# Patient Record
Sex: Female | Born: 1935 | Race: White | Hispanic: No | Marital: Married | Smoking: Never smoker
Health system: Southern US, Community
[De-identification: ages and names within clinical notes are randomized; demographics above are authoritative.]

## PROBLEM LIST (undated history)

## (undated) DIAGNOSIS — E119 Type 2 diabetes mellitus without complications: Secondary | ICD-10-CM

## (undated) DIAGNOSIS — G43909 Migraine, unspecified, not intractable, without status migrainosus: Secondary | ICD-10-CM

## (undated) DIAGNOSIS — I2510 Atherosclerotic heart disease of native coronary artery without angina pectoris: Secondary | ICD-10-CM

## (undated) DIAGNOSIS — K625 Hemorrhage of anus and rectum: Secondary | ICD-10-CM

## (undated) DIAGNOSIS — K5790 Diverticulosis of intestine, part unspecified, without perforation or abscess without bleeding: Secondary | ICD-10-CM

## (undated) DIAGNOSIS — I10 Essential (primary) hypertension: Secondary | ICD-10-CM

## (undated) DIAGNOSIS — K922 Gastrointestinal hemorrhage, unspecified: Secondary | ICD-10-CM

## (undated) DIAGNOSIS — I4891 Unspecified atrial fibrillation: Secondary | ICD-10-CM

## (undated) DIAGNOSIS — M1990 Unspecified osteoarthritis, unspecified site: Secondary | ICD-10-CM

## (undated) DIAGNOSIS — Z950 Presence of cardiac pacemaker: Secondary | ICD-10-CM

## (undated) HISTORY — DX: Unspecified atrial fibrillation: I48.91

## (undated) HISTORY — PX: CATARACT EXTRACTION W/ INTRAOCULAR LENS  IMPLANT, BILATERAL: SHX1307

## (undated) HISTORY — PX: TOTAL HIP ARTHROPLASTY: SHX124

## (undated) HISTORY — PX: JOINT REPLACEMENT: SHX530

## (undated) HISTORY — DX: Diverticulosis of intestine, part unspecified, without perforation or abscess without bleeding: K57.90

## (undated) HISTORY — DX: Gastrointestinal hemorrhage, unspecified: K92.2

---

## 2014-09-01 DIAGNOSIS — I70213 Atherosclerosis of native arteries of extremities with intermittent claudication, bilateral legs: Secondary | ICD-10-CM | POA: Diagnosis not present

## 2014-09-01 DIAGNOSIS — B351 Tinea unguium: Secondary | ICD-10-CM | POA: Diagnosis not present

## 2014-09-01 DIAGNOSIS — I83813 Varicose veins of bilateral lower extremities with pain: Secondary | ICD-10-CM | POA: Diagnosis not present

## 2014-09-07 DIAGNOSIS — Z7901 Long term (current) use of anticoagulants: Secondary | ICD-10-CM | POA: Diagnosis not present

## 2014-09-07 DIAGNOSIS — R9431 Abnormal electrocardiogram [ECG] [EKG]: Secondary | ICD-10-CM | POA: Diagnosis not present

## 2014-09-07 DIAGNOSIS — R7989 Other specified abnormal findings of blood chemistry: Secondary | ICD-10-CM | POA: Diagnosis not present

## 2014-09-07 DIAGNOSIS — R502 Drug induced fever: Secondary | ICD-10-CM | POA: Diagnosis not present

## 2014-09-07 DIAGNOSIS — I480 Paroxysmal atrial fibrillation: Secondary | ICD-10-CM | POA: Diagnosis not present

## 2014-09-07 DIAGNOSIS — E119 Type 2 diabetes mellitus without complications: Secondary | ICD-10-CM | POA: Diagnosis not present

## 2014-09-07 DIAGNOSIS — R509 Fever, unspecified: Secondary | ICD-10-CM | POA: Diagnosis not present

## 2014-09-07 DIAGNOSIS — R531 Weakness: Secondary | ICD-10-CM | POA: Diagnosis not present

## 2014-09-07 DIAGNOSIS — R001 Bradycardia, unspecified: Secondary | ICD-10-CM | POA: Diagnosis not present

## 2014-09-07 DIAGNOSIS — N10 Acute tubulo-interstitial nephritis: Secondary | ICD-10-CM | POA: Diagnosis not present

## 2014-09-07 DIAGNOSIS — I10 Essential (primary) hypertension: Secondary | ICD-10-CM | POA: Diagnosis not present

## 2014-09-07 DIAGNOSIS — R110 Nausea: Secondary | ICD-10-CM | POA: Diagnosis not present

## 2014-09-07 DIAGNOSIS — N12 Tubulo-interstitial nephritis, not specified as acute or chronic: Secondary | ICD-10-CM | POA: Diagnosis not present

## 2014-09-07 DIAGNOSIS — E876 Hypokalemia: Secondary | ICD-10-CM | POA: Diagnosis not present

## 2014-09-08 DIAGNOSIS — E876 Hypokalemia: Secondary | ICD-10-CM | POA: Diagnosis present

## 2014-09-08 DIAGNOSIS — Z7901 Long term (current) use of anticoagulants: Secondary | ICD-10-CM | POA: Diagnosis not present

## 2014-09-08 DIAGNOSIS — R079 Chest pain, unspecified: Secondary | ICD-10-CM | POA: Diagnosis not present

## 2014-09-08 DIAGNOSIS — I480 Paroxysmal atrial fibrillation: Secondary | ICD-10-CM | POA: Diagnosis present

## 2014-09-08 DIAGNOSIS — I10 Essential (primary) hypertension: Secondary | ICD-10-CM | POA: Diagnosis present

## 2014-09-08 DIAGNOSIS — R9431 Abnormal electrocardiogram [ECG] [EKG]: Secondary | ICD-10-CM | POA: Diagnosis present

## 2014-09-08 DIAGNOSIS — R509 Fever, unspecified: Secondary | ICD-10-CM | POA: Diagnosis present

## 2014-09-08 DIAGNOSIS — N16 Renal tubulo-interstitial disorders in diseases classified elsewhere: Secondary | ICD-10-CM | POA: Diagnosis not present

## 2014-09-08 DIAGNOSIS — B3749 Other urogenital candidiasis: Secondary | ICD-10-CM | POA: Diagnosis not present

## 2014-09-08 DIAGNOSIS — E119 Type 2 diabetes mellitus without complications: Secondary | ICD-10-CM | POA: Diagnosis present

## 2014-09-08 DIAGNOSIS — R001 Bradycardia, unspecified: Secondary | ICD-10-CM | POA: Diagnosis present

## 2014-09-08 DIAGNOSIS — N111 Chronic obstructive pyelonephritis: Secondary | ICD-10-CM | POA: Diagnosis not present

## 2014-09-08 DIAGNOSIS — N10 Acute tubulo-interstitial nephritis: Secondary | ICD-10-CM | POA: Diagnosis present

## 2014-09-14 DIAGNOSIS — I10 Essential (primary) hypertension: Secondary | ICD-10-CM | POA: Diagnosis not present

## 2014-09-14 DIAGNOSIS — N390 Urinary tract infection, site not specified: Secondary | ICD-10-CM | POA: Diagnosis not present

## 2014-10-25 DIAGNOSIS — I509 Heart failure, unspecified: Secondary | ICD-10-CM | POA: Diagnosis not present

## 2014-10-25 DIAGNOSIS — I440 Atrioventricular block, first degree: Secondary | ICD-10-CM | POA: Diagnosis not present

## 2014-10-25 DIAGNOSIS — E119 Type 2 diabetes mellitus without complications: Secondary | ICD-10-CM | POA: Diagnosis not present

## 2014-10-25 DIAGNOSIS — R9431 Abnormal electrocardiogram [ECG] [EKG]: Secondary | ICD-10-CM | POA: Diagnosis not present

## 2014-10-25 DIAGNOSIS — R5383 Other fatigue: Secondary | ICD-10-CM | POA: Diagnosis not present

## 2014-10-25 DIAGNOSIS — Z7982 Long term (current) use of aspirin: Secondary | ICD-10-CM | POA: Diagnosis not present

## 2014-10-25 DIAGNOSIS — E785 Hyperlipidemia, unspecified: Secondary | ICD-10-CM | POA: Diagnosis not present

## 2014-10-25 DIAGNOSIS — N390 Urinary tract infection, site not specified: Secondary | ICD-10-CM | POA: Diagnosis not present

## 2014-10-25 DIAGNOSIS — I10 Essential (primary) hypertension: Secondary | ICD-10-CM | POA: Diagnosis not present

## 2014-10-25 DIAGNOSIS — I517 Cardiomegaly: Secondary | ICD-10-CM | POA: Diagnosis not present

## 2014-10-25 DIAGNOSIS — Z79899 Other long term (current) drug therapy: Secondary | ICD-10-CM | POA: Diagnosis not present

## 2014-10-25 DIAGNOSIS — R531 Weakness: Secondary | ICD-10-CM | POA: Diagnosis not present

## 2014-10-26 DIAGNOSIS — R000 Tachycardia, unspecified: Secondary | ICD-10-CM | POA: Diagnosis not present

## 2014-10-26 DIAGNOSIS — E785 Hyperlipidemia, unspecified: Secondary | ICD-10-CM | POA: Diagnosis not present

## 2014-10-26 DIAGNOSIS — I10 Essential (primary) hypertension: Secondary | ICD-10-CM | POA: Diagnosis not present

## 2014-10-26 DIAGNOSIS — R001 Bradycardia, unspecified: Secondary | ICD-10-CM | POA: Diagnosis not present

## 2014-10-26 DIAGNOSIS — I495 Sick sinus syndrome: Secondary | ICD-10-CM | POA: Diagnosis not present

## 2014-10-26 DIAGNOSIS — I350 Nonrheumatic aortic (valve) stenosis: Secondary | ICD-10-CM | POA: Diagnosis not present

## 2014-10-26 DIAGNOSIS — E119 Type 2 diabetes mellitus without complications: Secondary | ICD-10-CM | POA: Diagnosis not present

## 2014-11-01 DIAGNOSIS — I495 Sick sinus syndrome: Secondary | ICD-10-CM | POA: Diagnosis not present

## 2014-11-01 DIAGNOSIS — I350 Nonrheumatic aortic (valve) stenosis: Secondary | ICD-10-CM | POA: Diagnosis not present

## 2014-11-01 DIAGNOSIS — R001 Bradycardia, unspecified: Secondary | ICD-10-CM | POA: Diagnosis not present

## 2014-11-01 DIAGNOSIS — E785 Hyperlipidemia, unspecified: Secondary | ICD-10-CM | POA: Diagnosis not present

## 2014-11-01 DIAGNOSIS — E119 Type 2 diabetes mellitus without complications: Secondary | ICD-10-CM | POA: Diagnosis not present

## 2014-11-01 DIAGNOSIS — I10 Essential (primary) hypertension: Secondary | ICD-10-CM | POA: Diagnosis not present

## 2014-11-02 DIAGNOSIS — I10 Essential (primary) hypertension: Secondary | ICD-10-CM | POA: Diagnosis not present

## 2014-11-02 DIAGNOSIS — E871 Hypo-osmolality and hyponatremia: Secondary | ICD-10-CM | POA: Diagnosis not present

## 2014-11-03 DIAGNOSIS — I495 Sick sinus syndrome: Secondary | ICD-10-CM | POA: Diagnosis not present

## 2014-11-03 DIAGNOSIS — I350 Nonrheumatic aortic (valve) stenosis: Secondary | ICD-10-CM | POA: Diagnosis not present

## 2014-11-03 DIAGNOSIS — R9431 Abnormal electrocardiogram [ECG] [EKG]: Secondary | ICD-10-CM | POA: Diagnosis not present

## 2014-11-04 DIAGNOSIS — E871 Hypo-osmolality and hyponatremia: Secondary | ICD-10-CM | POA: Diagnosis not present

## 2014-11-04 DIAGNOSIS — I10 Essential (primary) hypertension: Secondary | ICD-10-CM | POA: Diagnosis not present

## 2014-11-09 DIAGNOSIS — Z01818 Encounter for other preprocedural examination: Secondary | ICD-10-CM | POA: Diagnosis not present

## 2014-11-09 DIAGNOSIS — Z45010 Encounter for checking and testing of cardiac pacemaker pulse generator [battery]: Secondary | ICD-10-CM | POA: Diagnosis not present

## 2014-11-09 DIAGNOSIS — I4891 Unspecified atrial fibrillation: Secondary | ICD-10-CM | POA: Diagnosis not present

## 2014-11-09 DIAGNOSIS — I480 Paroxysmal atrial fibrillation: Secondary | ICD-10-CM | POA: Diagnosis not present

## 2014-11-09 DIAGNOSIS — I10 Essential (primary) hypertension: Secondary | ICD-10-CM | POA: Diagnosis not present

## 2014-11-09 DIAGNOSIS — R0602 Shortness of breath: Secondary | ICD-10-CM | POA: Diagnosis not present

## 2014-11-09 DIAGNOSIS — E119 Type 2 diabetes mellitus without complications: Secondary | ICD-10-CM | POA: Diagnosis not present

## 2014-11-09 DIAGNOSIS — I495 Sick sinus syndrome: Secondary | ICD-10-CM | POA: Diagnosis not present

## 2014-11-09 DIAGNOSIS — R001 Bradycardia, unspecified: Secondary | ICD-10-CM | POA: Diagnosis not present

## 2014-11-16 DIAGNOSIS — I4891 Unspecified atrial fibrillation: Secondary | ICD-10-CM | POA: Diagnosis not present

## 2014-11-16 DIAGNOSIS — Z950 Presence of cardiac pacemaker: Secondary | ICD-10-CM | POA: Diagnosis not present

## 2014-11-16 DIAGNOSIS — R001 Bradycardia, unspecified: Secondary | ICD-10-CM | POA: Diagnosis not present

## 2014-11-17 DIAGNOSIS — I70213 Atherosclerosis of native arteries of extremities with intermittent claudication, bilateral legs: Secondary | ICD-10-CM | POA: Diagnosis not present

## 2014-11-17 DIAGNOSIS — I10 Essential (primary) hypertension: Secondary | ICD-10-CM | POA: Diagnosis not present

## 2014-11-17 DIAGNOSIS — L603 Nail dystrophy: Secondary | ICD-10-CM | POA: Diagnosis not present

## 2014-11-17 DIAGNOSIS — E871 Hypo-osmolality and hyponatremia: Secondary | ICD-10-CM | POA: Diagnosis not present

## 2014-11-17 DIAGNOSIS — N390 Urinary tract infection, site not specified: Secondary | ICD-10-CM | POA: Diagnosis not present

## 2014-11-17 DIAGNOSIS — B351 Tinea unguium: Secondary | ICD-10-CM | POA: Diagnosis not present

## 2014-11-17 DIAGNOSIS — M79675 Pain in left toe(s): Secondary | ICD-10-CM | POA: Diagnosis not present

## 2014-11-17 DIAGNOSIS — M79674 Pain in right toe(s): Secondary | ICD-10-CM | POA: Diagnosis not present

## 2014-11-23 DIAGNOSIS — I10 Essential (primary) hypertension: Secondary | ICD-10-CM | POA: Diagnosis not present

## 2014-11-23 DIAGNOSIS — E119 Type 2 diabetes mellitus without complications: Secondary | ICD-10-CM | POA: Diagnosis not present

## 2014-11-23 DIAGNOSIS — N390 Urinary tract infection, site not specified: Secondary | ICD-10-CM | POA: Diagnosis not present

## 2014-11-23 DIAGNOSIS — E871 Hypo-osmolality and hyponatremia: Secondary | ICD-10-CM | POA: Diagnosis not present

## 2014-11-23 DIAGNOSIS — N16 Renal tubulo-interstitial disorders in diseases classified elsewhere: Secondary | ICD-10-CM | POA: Diagnosis not present

## 2014-11-24 DIAGNOSIS — R001 Bradycardia, unspecified: Secondary | ICD-10-CM | POA: Diagnosis not present

## 2014-11-30 DIAGNOSIS — I480 Paroxysmal atrial fibrillation: Secondary | ICD-10-CM | POA: Diagnosis not present

## 2015-01-01 ENCOUNTER — Encounter (HOSPITAL_COMMUNITY): Payer: Self-pay | Admitting: *Deleted

## 2015-01-01 ENCOUNTER — Inpatient Hospital Stay (HOSPITAL_COMMUNITY)
Admission: EM | Admit: 2015-01-01 | Discharge: 2015-01-07 | DRG: 378 | Disposition: A | Discharge status: Home / Self Care | Payer: Medicare Other | Attending: Oncology | Admitting: Oncology

## 2015-01-01 ENCOUNTER — Other Ambulatory Visit (HOSPITAL_COMMUNITY): Payer: Self-pay

## 2015-01-01 DIAGNOSIS — I4891 Unspecified atrial fibrillation: Secondary | ICD-10-CM | POA: Diagnosis not present

## 2015-01-01 DIAGNOSIS — E785 Hyperlipidemia, unspecified: Secondary | ICD-10-CM | POA: Diagnosis present

## 2015-01-01 DIAGNOSIS — Z950 Presence of cardiac pacemaker: Secondary | ICD-10-CM

## 2015-01-01 DIAGNOSIS — Z7901 Long term (current) use of anticoagulants: Secondary | ICD-10-CM

## 2015-01-01 DIAGNOSIS — K5731 Diverticulosis of large intestine without perforation or abscess with bleeding: Secondary | ICD-10-CM | POA: Diagnosis not present

## 2015-01-01 DIAGNOSIS — K625 Hemorrhage of anus and rectum: Secondary | ICD-10-CM | POA: Diagnosis not present

## 2015-01-01 DIAGNOSIS — K644 Residual hemorrhoidal skin tags: Secondary | ICD-10-CM | POA: Diagnosis present

## 2015-01-01 DIAGNOSIS — Z7982 Long term (current) use of aspirin: Secondary | ICD-10-CM

## 2015-01-01 DIAGNOSIS — I480 Paroxysmal atrial fibrillation: Secondary | ICD-10-CM | POA: Diagnosis present

## 2015-01-01 DIAGNOSIS — K922 Gastrointestinal hemorrhage, unspecified: Secondary | ICD-10-CM | POA: Insufficient documentation

## 2015-01-01 DIAGNOSIS — D649 Anemia, unspecified: Secondary | ICD-10-CM | POA: Diagnosis present

## 2015-01-01 DIAGNOSIS — R0789 Other chest pain: Secondary | ICD-10-CM | POA: Diagnosis not present

## 2015-01-01 DIAGNOSIS — I482 Chronic atrial fibrillation: Secondary | ICD-10-CM | POA: Diagnosis not present

## 2015-01-01 DIAGNOSIS — Z79899 Other long term (current) drug therapy: Secondary | ICD-10-CM | POA: Diagnosis not present

## 2015-01-01 DIAGNOSIS — Q438 Other specified congenital malformations of intestine: Secondary | ICD-10-CM | POA: Diagnosis not present

## 2015-01-01 DIAGNOSIS — I10 Essential (primary) hypertension: Secondary | ICD-10-CM | POA: Diagnosis not present

## 2015-01-01 DIAGNOSIS — E119 Type 2 diabetes mellitus without complications: Secondary | ICD-10-CM | POA: Diagnosis not present

## 2015-01-01 DIAGNOSIS — I2510 Atherosclerotic heart disease of native coronary artery without angina pectoris: Secondary | ICD-10-CM | POA: Diagnosis present

## 2015-01-01 DIAGNOSIS — K5733 Diverticulitis of large intestine without perforation or abscess with bleeding: Secondary | ICD-10-CM | POA: Diagnosis present

## 2015-01-01 DIAGNOSIS — M6281 Muscle weakness (generalized): Secondary | ICD-10-CM | POA: Diagnosis not present

## 2015-01-01 HISTORY — DX: Presence of cardiac pacemaker: Z95.0

## 2015-01-01 HISTORY — DX: Atherosclerotic heart disease of native coronary artery without angina pectoris: I25.10

## 2015-01-01 LAB — CBC
HCT: 29.1 % — ABNORMAL LOW (ref 36.0–46.0)
HCT: 19.5 % — ABNORMAL LOW (ref 36.0–46.0)
HEMOGLOBIN: 9.6 g/dL — AB (ref 12.0–15.0)
HEMOGLOBIN: 6.6 g/dL — AB (ref 12.0–15.0)
MCH: 31.1 pg (ref 26.0–34.0)
MCH: 31.6 pg (ref 26.0–34.0)
MCHC: 33 g/dL (ref 30.0–36.0)
MCHC: 33.8 g/dL (ref 30.0–36.0)
MCV: 94.2 fL (ref 78.0–100.0)
MCV: 93.3 fL (ref 78.0–100.0)
PLATELETS: 318 10*3/uL (ref 150–400)
PLATELETS: 230 10*3/uL (ref 150–400)
RBC: 3.09 MIL/uL — ABNORMAL LOW (ref 3.87–5.11)
RBC: 2.09 MIL/uL — ABNORMAL LOW (ref 3.87–5.11)
RDW: 15.2 % (ref 11.5–15.5)
RDW: 15.1 % (ref 11.5–15.5)
WBC: 10.6 10*3/uL — AB (ref 4.0–10.5)
WBC: 7.5 10*3/uL (ref 4.0–10.5)

## 2015-01-01 LAB — COMPREHENSIVE METABOLIC PANEL WITH GFR
ALT: 15 U/L (ref 14–54)
AST: 31 U/L (ref 15–41)
Albumin: 3.2 g/dL — ABNORMAL LOW (ref 3.5–5.0)
Alkaline Phosphatase: 26 U/L — ABNORMAL LOW (ref 38–126)
Anion gap: 9 (ref 5–15)
BUN: 28 mg/dL — ABNORMAL HIGH (ref 6–20)
CO2: 21 mmol/L — ABNORMAL LOW (ref 22–32)
Calcium: 9 mg/dL (ref 8.9–10.3)
Chloride: 108 mmol/L (ref 101–111)
Creatinine, Ser: 0.91 mg/dL (ref 0.44–1.00)
GFR calc Af Amer: >60 mL/min
GFR calc non Af Amer: 57 mL/min — ABNORMAL LOW
Glucose, Bld: 162 mg/dL — ABNORMAL HIGH (ref 65–99)
Potassium: 3.9 mmol/L (ref 3.5–5.1)
Sodium: 138 mmol/L (ref 135–145)
Total Bilirubin: 0.8 mg/dL (ref 0.3–1.2)
Total Protein: 6.3 g/dL — ABNORMAL LOW (ref 6.5–8.1)

## 2015-01-01 LAB — ABO/RH: ABO/RH(D): A NEG

## 2015-01-01 LAB — POC OCCULT BLOOD, ED: Fecal Occult Bld: POSITIVE — AB

## 2015-01-01 LAB — PROTIME-INR
INR: 1.42 (ref 0.00–1.49)
Prothrombin Time: 17.5 s — ABNORMAL HIGH (ref 11.6–15.2)

## 2015-01-01 LAB — PREPARE RBC (CROSSMATCH)

## 2015-01-01 MED ORDER — SODIUM CHLORIDE 0.9 % IV BOLUS (SEPSIS)
500.0000 mL | Freq: Once | INTRAVENOUS | Status: AC
  Administered 2015-01-01: 500 mL via INTRAVENOUS

## 2015-01-01 MED ORDER — SODIUM CHLORIDE 0.9 % IV SOLN
Freq: Once | INTRAVENOUS | Status: AC
  Administered 2015-01-06: 12:00:00 via INTRAVENOUS

## 2015-01-01 NOTE — Progress Notes (Signed)
3E RN attempted to get report on pt. ED RN to call back.

## 2015-01-01 NOTE — ED Notes (Signed)
The pt has had rectal bleeding since this am no pain no vomiting no previous history  Hx pacemaker placement  This year

## 2015-01-01 NOTE — ED Provider Notes (Signed)
CSN: [id number]     Arrival date & time 01/01/15  1523 History   First MD Initiated Contact with Patient 01/01/15 1635     Chief Complaint  Patient presents with  . Rectal Bleeding     (Consider location/radiation/quality/duration/timing/severity/associated sxs/prior Treatment) Patient is a 81 y.o. female presenting with hematochezia. The history is provided by the patient.  Rectal Bleeding Quality:  Bright red Amount:  Moderate Duration:  12 hours Timing:  Intermittent Progression:  Unchanged Chronicity:  New Context comment:  On eliquis Similar prior episodes: no   Relieved by:  Nothing Worsened by:  Nothing tried Ineffective treatments:  None tried Associated symptoms: no abdominal pain, no dizziness, no fever and no vomiting     Past Medical History  Diagnosis Date  . Coronary artery disease   . Pacemaker    No past surgical history on file. No family history on file. History  Substance Use Topics  . Smoking status: Never Smoker   . Smokeless tobacco: Not on file  . Alcohol Use: No   OB History    No data available     Review of Systems  Constitutional: Negative for fever and fatigue.  HENT: Negative for congestion and drooling.   Eyes: Negative for pain.  Respiratory: Negative for cough and shortness of breath.   Cardiovascular: Negative for chest pain.  Gastrointestinal: Positive for hematochezia. Negative for nausea, vomiting, abdominal pain and diarrhea.  Genitourinary: Negative for dysuria and hematuria.       Bloody stools  Musculoskeletal: Negative for back pain, gait problem and neck pain.  Skin: Negative for color change.  Neurological: Positive for weakness (generalized). Negative for dizziness and headaches.  Hematological: Negative for adenopathy.  Psychiatric/Behavioral: Negative for behavioral problems.  All other systems reviewed and are negative.     Allergies  Review of patient's allergies indicates no known allergies.  Home  Medications   Prior to Admission medications   Medication Sig Start Date End Date Taking? Authorizing Provider  apixaban (ELIQUIS) 2.5 MG TABS tablet Take 2.5 mg by mouth 2 (two) times daily.   Yes Historical Provider, MD  aspirin EC 81 MG tablet Take 81 mg by mouth daily.   Yes Historical Provider, MD  bacitracin ointment Apply 1 application topically 2 (two) times daily.   Yes Historical Provider, MD  cholecalciferol (VITAMIN D) 1000 UNITS tablet Take 1,000 Units by mouth daily.   Yes Historical Provider, MD  Cyanocobalamin (B-12 PO) Take 1 tablet by mouth daily.   Yes Historical Provider, MD  fenofibrate (TRIGLIDE) 50 MG tablet Take 50 mg by mouth daily.   Yes Historical Provider, MD  Flaxseed, Linseed, (FLAX PO) Take 1,200 mg by mouth daily.   Yes Historical Provider, MD  meloxicam (MOBIC) 7.5 MG tablet Take 7.5 mg by mouth 2 (two) times daily.   Yes Historical Provider, MD  metFORMIN (GLUCOPHAGE) 500 MG tablet Take 500 mg by mouth 2 (two) times daily with a meal.   Yes Historical Provider, MD  metoprolol tartrate (LOPRESSOR) 25 MG tablet Take 25 mg by mouth at bedtime.    Yes Historical Provider, MD  Omega-3 Fatty Acids (FISH OIL TRIPLE STRENGTH) 1400 MG CAPS Take 1,400 mg by mouth 2 (two) times daily.   Yes Historical Provider, MD  polyethylene glycol (MIRALAX / GLYCOLAX) packet Take 17 g by mouth at bedtime.   Yes Historical Provider, MD  ramipril (ALTACE) 10 MG capsule Take 10 mg by mouth daily.   Yes Historical Provider, MD  vitamin C (ASCORBIC ACID) 500 MG tablet Take 500 mg by mouth daily.   Yes Historical Provider, MD   BP 99/49 mmHg  Pulse 68  Temp(Src) 97.7 F (36.5 C) (Oral)  Resp 16  Ht 5\' 6"  (1.676 m)  Wt 134 lb (60.782 kg)  BMI 21.64 kg/m2  SpO2 99% Physical Exam  Constitutional: She is oriented to person, place, and time. She appears well-developed and well-nourished.  HENT:  Head: Normocephalic.  Mouth/Throat: Oropharynx is clear and moist. No oropharyngeal  exudate.  Eyes: Conjunctivae and EOM are normal. Pupils are equal, round, and reactive to light.  Neck: Normal range of motion. Neck supple.  Cardiovascular: Normal rate, regular rhythm, normal heart sounds and intact distal pulses.  Exam reveals no gallop and no friction rub.   No murmur heard. Pulmonary/Chest: Effort normal and breath sounds normal. No respiratory distress. She has no wheezes.  Abdominal: Soft. Bowel sounds are normal. There is no tenderness. There is no rebound and no guarding.  Genitourinary:  Mild external hemorrhoid which is noninflamed on external rectal exam. Dark blood crusting noticed around the external rectum. Dark bloody stool. Rectal exam otherwise normal.  Musculoskeletal: Normal range of motion. She exhibits no edema or tenderness.  Neurological: She is alert and oriented to person, place, and time.  Skin: Skin is warm and dry.  Psychiatric: She has a normal mood and affect. Her behavior is normal.  Nursing note and vitals reviewed.   ED Course  Procedures (including critical care time) Labs Review Labs Reviewed  PROTIME-INR - Abnormal; Notable for the following:    Prothrombin Time 17.5 (*)    All other components within normal limits  CBC - Abnormal; Notable for the following:    WBC 10.6 (*)    RBC 3.09 (*)    Hemoglobin 9.6 (*)    HCT 29.1 (*)    All other components within normal limits  COMPREHENSIVE METABOLIC PANEL - Abnormal; Notable for the following:    CO2 21 (*)    Glucose, Bld 162 (*)    BUN 28 (*)    Total Protein 6.3 (*)    Albumin 3.2 (*)    Alkaline Phosphatase 26 (*)    GFR calc non Af Amer 57 (*)    All other components within normal limits  CBC - Abnormal; Notable for the following:    RBC 2.09 (*)    Hemoglobin 6.6 (*)    HCT 19.5 (*)    All other components within normal limits  CBC - Abnormal; Notable for the following:    RBC 2.04 (*)    Hemoglobin 6.5 (*)    HCT 19.3 (*)    All other components within normal  limits  CBC - Abnormal; Notable for the following:    WBC 13.9 (*)    RBC 2.77 (*)    Hemoglobin 8.6 (*)    HCT 24.3 (*)    RDW 16.9 (*)    All other components within normal limits  GLUCOSE, CAPILLARY - Abnormal; Notable for the following:    Glucose-Capillary 143 (*)    All other components within normal limits  GLUCOSE, CAPILLARY - Abnormal; Notable for the following:    Glucose-Capillary 118 (*)    All other components within normal limits  GLUCOSE, CAPILLARY - Abnormal; Notable for the following:    Glucose-Capillary 120 (*)    All other components within normal limits  POC OCCULT BLOOD, ED - Abnormal; Notable for the following:  Fecal Occult Bld POSITIVE (*)    All other components within normal limits  MRSA PCR SCREENING  APTT  CBC  CBC  POC OCCULT BLOOD, ED  TYPE AND SCREEN  ABO/RH  PREPARE RBC (CROSSMATCH)  PREPARE RBC (CROSSMATCH)    Imaging Review No results found.   EKG Interpretation   Date/Time:  Sunday January 01 2015 15:51:01 EDT Ventricular Rate:  78 PR Interval:    QRS Duration: 112 QT Interval:  406 QTC Calculation: 462 R Axis:   -21 Text Interpretation:  Atrial fibrillation with frequent ventricular-paced  complexes Lateral infarct , age undetermined ST \\T \ T wave abnormality,  consider inferior ischemia ST \\T \ T wave abnormality, consider anterior  ischemia Confirmed by Dathan Attia  MD, Ilham Roughton ([id number]) on 01/01/2015 4:49:06 PM     CRITICAL CARE Performed by: Pamella Pert, S Total critical care time: 30 min Critical care time was exclusive of separately billable procedures and treating other patients. Critical care was necessary to treat or prevent imminent or life-threatening deterioration. Critical care was time spent personally by me on the following activities: development of treatment plan with patient and/or surrogate as well as nursing, discussions with consultants, evaluation of patient's response to treatment, examination of patient,  obtaining history from patient or surrogate, ordering and performing treatments and interventions, ordering and review of laboratory studies, ordering and review of radiographic studies, pulse oximetry and re-evaluation of patient's condition.  MDM   Final diagnoses:  Rectal bleeding    5:20 PM 81 y.o. female w hx of CAD s/p pacemaker in April '16 who presents with 4 episodes of bright red bloody stool since around 6 AM this morning. She has some mild generalized weakness but denies any pain. She is on Eliquis. She has had some soft blood pressures here but vital signs otherwise unremarkable. We'll get screening labs and give a small bolus.  Soft BP's w/ sys in the 90's initially improved w/ small 500 cc IVF bolus. BP began softening again later in her stay in the ED (BP as low as 75/42). She did have a small amount of dark red blood from the rectum which I observed at the bedside. Repeat cbc ordered and another 500 cc IVF was given. I discussed the case multiple times w/ the resident team after the initial consult for admission to keep them updated on her status and discuss further mgmt. We decided to place her in stepdown and i would go ahead and order blood prior to repeat cbc results returning.  IM team states they will contact GI. Pt continues to deny any complaints. Has not been tachycardic (does take BB at night, but likely >24 hrs since last taken).   Hgb returned at 6.5. Blood ordered. Pt taken to stepdown.     Pamella Pert, MD 01/02/15 1154

## 2015-01-01 NOTE — ED Notes (Signed)
ED MD aware of BP  

## 2015-01-01 NOTE — ED Notes (Signed)
Attempted report 

## 2015-01-01 NOTE — H&P (Signed)
Date: 01/01/2015               Patient Name:  Jill Short MRN: [id number]  DOB: [birth year] Age / Sex: 81 y.o., female   PCP: No primary care provider on file.         Medical Service: Internal Medicine Teaching Service         Attending Physician: Dr. Oval Linsey, MD    First Contact: Dr. Trudee Kuster Pager: [phone]  Second Contact: Dr. Gordy Levan Pager: [phone]       After Hours (After 5p/  First Contact Pager: [phone]  weekends / holidays): Second Contact Pager: [phone]   Chief Complaint: BRBPR  History of Present Illness: Pt is an 81 y/o F w/ PMHx of afib w/ pacemaker on eliquis, HTN, DM, and HLD who presents with bright red blood per rectum. This morning at 6:30am pt had an episode of bowel incontinence in bed and noticed blood on the toilet paper when she wiped. She states her stools were brown. She then had 2 more BMs throughout the day with BRBPR. Her daughter noticed that the patient was weaker than usual and appeared pale and brought her to the ED. Pt has DOE. She denies HA, light headedness, SOB, chest pain, n/v, and abd pain. She takes meloxicam 7.5mg  BID. She had a colonoscopy in 2014 that revealed diverticulosis, she was told she did not need another colonoscopy again due to her age. In the ED she passed a blood clot and her blood pressure was low down to 85/45. Her husband states SBP normally in the 120's. She was started on 2 units of RBC transfusion while in the ED.   Meds: Current Facility-Administered Medications  Medication Dose Route Frequency Provider Last Rate Last Dose  . 0.9 %  sodium chloride infusion   Intravenous Once Pamella Pert, MD       Current Outpatient Prescriptions  Medication Sig Dispense Refill  . apixaban (ELIQUIS) 2.5 MG TABS tablet Take 2.5 mg by mouth 2 (two) times daily.    Marland Kitchen aspirin EC 81 MG tablet Take 81 mg by mouth daily.    . bacitracin ointment Apply 1 application topically 2 (two) times daily.    . cholecalciferol (VITAMIN D) 1000  UNITS tablet Take 1,000 Units by mouth daily.    . Cyanocobalamin (B-12 PO) Take 1 tablet by mouth daily.    . fenofibrate (TRIGLIDE) 50 MG tablet Take 50 mg by mouth daily.    . Flaxseed, Linseed, (FLAX PO) Take 1,200 mg by mouth daily.    . meloxicam (MOBIC) 7.5 MG tablet Take 7.5 mg by mouth 2 (two) times daily.    . metFORMIN (GLUCOPHAGE) 500 MG tablet Take 500 mg by mouth 2 (two) times daily with a meal.    . metoprolol tartrate (LOPRESSOR) 25 MG tablet Take 25 mg by mouth at bedtime.     . Omega-3 Fatty Acids (FISH OIL TRIPLE STRENGTH) 1400 MG CAPS Take 1,400 mg by mouth 2 (two) times daily.    . polyethylene glycol (MIRALAX / GLYCOLAX) packet Take 17 g by mouth at bedtime.    . ramipril (ALTACE) 10 MG capsule Take 10 mg by mouth daily.    . vitamin C (ASCORBIC ACID) 500 MG tablet Take 500 mg by mouth daily.      Allergies: Allergies as of 01/01/2015  . (No Known Allergies)   Past Medical History  Diagnosis Date  . Coronary artery disease   . Pacemaker    No  past surgical history on file. No family history on file. History   Social History  . Marital Status: Married    Spouse Name: N/A  . Number of Children: N/A  . Years of Education: N/A   Occupational History  . Not on file.   Social History Main Topics  . Smoking status: Never Smoker   . Smokeless tobacco: Not on file  . Alcohol Use: No  . Drug Use: Not on file  . Sexual Activity: Not on file   Other Topics Concern  . Not on file   Social History Narrative  . No narrative on file    Review of Systems: Pertinent items are noted in HPI.  Physical Exam: Blood pressure 81/42, pulse 75, temperature 97.7 F (36.5 C), temperature source Oral, resp. rate 19, height 5\' 6"  (1.676 m), weight 134 lb (60.782 kg), SpO2 100 %. Physical Exam  Constitutional: She appears well-developed and well-nourished. No distress.  HENT:  Dry mucous membranes   Eyes: Conjunctivae are normal. Pupils are equal, round, and  reactive to light.  Cardiovascular: Normal rate and regular rhythm.   No murmur heard. Pulmonary/Chest: Effort normal and breath sounds normal. She has no wheezes.  Abdominal: Soft. Bowel sounds are normal. There is no tenderness.  Musculoskeletal: She exhibits edema (trace pedal edma).  Skin: Skin is warm and dry. There is pallor (conjunctival pallor).     Lab results: Basic Metabolic Panel:  Recent Labs  01/01/15 1609  NA 138  K 3.9  CL 108  CO2 21*  GLUCOSE 162*  BUN 28*  CREATININE 0.91  CALCIUM 9.0   Liver Function Tests:  Recent Labs  01/01/15 1609  AST 31  ALT 15  ALKPHOS 26*  BILITOT 0.8  PROT 6.3*  ALBUMIN 3.2*   CBC:  Recent Labs  01/01/15 1609  WBC 10.6*  HGB 9.6*  HCT 29.1*  MCV 94.2  PLT 318   Coagulation:  Recent Labs  01/01/15 1609  LABPROT 17.5*  INR 1.42    EKG Interpretation  Date/Time:  Sunday January 01 2015 15:51:01 EDT Ventricular Rate:  78 PR Interval:    QRS Duration: 112 QT Interval:  406 QTC Calculation: 462 R Axis:   -21 Text Interpretation:  Atrial fibrillation with frequent ventricular-paced complexes Lateral infarct , age undetermined ST \\T \ T wave abnormality, consider inferior ischemia ST \\T \ T wave abnormality, consider anterior ischemia Confirmed by HARRISON  MD, FORREST ([id number]) on 01/01/2015 4:49:06 PM  Assessment & Plan by Problem: Active Problems:   GI bleed  GI bleed-- Pt had 3 episodes of BRBPR and then passed a blood clot in the ED w/ hypotension. Likely due to an acute GI bleed from diverticulosis. She denies pain on defecation thus mild external hemorrhoid noted on rectal exam done by ED provider unlikely source of GI bleed. She is on meloxicam BID which could cause a GI bleed. hgb 9.6 w/ no priors to compare to. After she passed her blood clot in the ED stat CBC revealed hgb of 6.6. She is symptomatic with weakness and DOE. Started on 2 units of packed red blood cells transfusion in the ED and given 1L NS  bolus.  - admit to step down - CBC q6h  - NS at 100 cc/hr - GI consult in the am - stop meloxicam - She had her colonoscopy with Dr. Tanna Savoy, his number is [phone], can call in the morning for colo report. Her PCP is Dr Mariel Craft in [place], [place].   DM-- on metformin 500mg  qd at home - SSI w/ CBG q4h while NPO  HTN-- on lopressor 25mg  qhs and ramipril 10mg  daily - hold home meds due to soft BPs  Atrial fibrillation w/ pacemaker - hold eliquis  Diet- npo DVT ppx- SCDs CODE: FULL , confirmed w/ pt and daughter  Dispo: Disposition is deferred at this time, awaiting improvement of current medical problems.   The patient does have a current PCP (Dr Mariel Craft) and does not need an [facility] hospital follow-up appointment after discharge.  The patient does not have transportation limitations that hinder transportation to clinic appointments.  Signed: Norman Herrlich, MD 01/01/2015, 11:45 PM

## 2015-01-02 ENCOUNTER — Encounter (HOSPITAL_COMMUNITY): Payer: Self-pay | Admitting: *Deleted

## 2015-01-02 DIAGNOSIS — I480 Paroxysmal atrial fibrillation: Secondary | ICD-10-CM | POA: Diagnosis present

## 2015-01-02 DIAGNOSIS — K5733 Diverticulitis of large intestine without perforation or abscess with bleeding: Secondary | ICD-10-CM | POA: Diagnosis present

## 2015-01-02 DIAGNOSIS — E119 Type 2 diabetes mellitus without complications: Secondary | ICD-10-CM

## 2015-01-02 DIAGNOSIS — Z8619 Personal history of other infectious and parasitic diseases: Secondary | ICD-10-CM

## 2015-01-02 DIAGNOSIS — Z950 Presence of cardiac pacemaker: Secondary | ICD-10-CM | POA: Diagnosis present

## 2015-01-02 DIAGNOSIS — Z7901 Long term (current) use of anticoagulants: Secondary | ICD-10-CM

## 2015-01-02 LAB — GLUCOSE, CAPILLARY
GLUCOSE-CAPILLARY: 143 mg/dL — AB (ref 65–99)
GLUCOSE-CAPILLARY: 115 mg/dL — AB (ref 65–99)
Glucose-Capillary: 118 mg/dL — ABNORMAL HIGH (ref 65–99)
Glucose-Capillary: 120 mg/dL — ABNORMAL HIGH (ref 65–99)
Glucose-Capillary: 137 mg/dL — ABNORMAL HIGH (ref 65–99)
Glucose-Capillary: 120 mg/dL — ABNORMAL HIGH (ref 65–99)

## 2015-01-02 LAB — CBC
HCT: 19.3 % — ABNORMAL LOW (ref 36.0–46.0)
HCT: 24.3 % — ABNORMAL LOW (ref 36.0–46.0)
HCT: 20.8 % — ABNORMAL LOW (ref 36.0–46.0)
HEMOGLOBIN: 6.5 g/dL — AB (ref 12.0–15.0)
HEMOGLOBIN: 7.2 g/dL — AB (ref 12.0–15.0)
Hemoglobin: 8.6 g/dL — ABNORMAL LOW (ref 12.0–15.0)
MCH: 30.1 pg (ref 26.0–34.0)
MCH: 31 pg (ref 26.0–34.0)
MCH: 31.9 pg (ref 26.0–34.0)
MCHC: 33.7 g/dL (ref 30.0–36.0)
MCHC: 34.6 g/dL (ref 30.0–36.0)
MCHC: 35.4 g/dL (ref 30.0–36.0)
MCV: 94.6 fL (ref 78.0–100.0)
MCV: 87.7 fL (ref 78.0–100.0)
MCV: 87 fL (ref 78.0–100.0)
PLATELETS: 175 10*3/uL (ref 150–400)
PLATELETS: 229 10*3/uL (ref 150–400)
Platelets: 186 10*3/uL (ref 150–400)
RBC: 2.04 MIL/uL — ABNORMAL LOW (ref 3.87–5.11)
RBC: 2.77 MIL/uL — ABNORMAL LOW (ref 3.87–5.11)
RBC: 2.39 MIL/uL — AB (ref 3.87–5.11)
RDW: 15 % (ref 11.5–15.5)
RDW: 16.9 % — AB (ref 11.5–15.5)
RDW: 17.8 % — ABNORMAL HIGH (ref 11.5–15.5)
WBC: 9.5 10*3/uL (ref 4.0–10.5)
WBC: 13.9 10*3/uL — ABNORMAL HIGH (ref 4.0–10.5)
WBC: 13 10*3/uL — AB (ref 4.0–10.5)

## 2015-01-02 LAB — APTT: aPTT: 25 seconds (ref 24–37)

## 2015-01-02 LAB — PREPARE RBC (CROSSMATCH)

## 2015-01-02 LAB — MRSA PCR SCREENING: MRSA BY PCR: NEGATIVE

## 2015-01-02 MED ORDER — SODIUM CHLORIDE 0.9 % IV SOLN
Freq: Once | INTRAVENOUS | Status: AC
  Administered 2015-01-02: 02:00:00 via INTRAVENOUS

## 2015-01-02 MED ORDER — SODIUM CHLORIDE 0.9 % IV SOLN
Freq: Once | INTRAVENOUS | Status: AC
  Administered 2015-01-02: 17:00:00 via INTRAVENOUS

## 2015-01-02 MED ORDER — SODIUM CHLORIDE 0.9 % IV BOLUS (SEPSIS)
500.0000 mL | Freq: Once | INTRAVENOUS | Status: AC
  Administered 2015-01-02: 500 mL via INTRAVENOUS

## 2015-01-02 MED ORDER — SODIUM CHLORIDE 0.9 % IV SOLN
INTRAVENOUS | Status: DC
  Administered 2015-01-02: 1000 mL via INTRAVENOUS

## 2015-01-02 MED ORDER — PANTOPRAZOLE SODIUM 40 MG IV SOLR
40.0000 mg | INTRAVENOUS | Status: DC
  Administered 2015-01-02 – 2015-01-03 (×2): 40 mg via INTRAVENOUS
  Filled 2015-01-02 (×2): qty 40

## 2015-01-02 MED ORDER — ACETAMINOPHEN 325 MG PO TABS: 650.0000 mg | ORAL_TABLET | ORAL | Status: DC | PRN

## 2015-01-02 MED ORDER — INSULIN ASPART 100 UNIT/ML ~~LOC~~ SOLN: 0.0000 [IU] | Freq: Three times a day (TID) | SUBCUTANEOUS | Status: DC

## 2015-01-02 MED ORDER — POLYETHYLENE GLYCOL 3350 17 G PO PACK
17.0000 g | PACK | Freq: Three times a day (TID) | ORAL | Status: DC
  Administered 2015-01-02 – 2015-01-04 (×9): 17 g via ORAL
  Filled 2015-01-02 (×11): qty 1

## 2015-01-02 MED ORDER — INSULIN ASPART 100 UNIT/ML ~~LOC~~ SOLN
0.0000 [IU] | SUBCUTANEOUS | Status: DC
  Administered 2015-01-02: 1 [IU] via SUBCUTANEOUS

## 2015-01-02 NOTE — Consult Note (Signed)
[facility] CONSULT Reason for consult: Rectal Bleeding Referring Physician: Internal Medicine Teaching Service. Primary G.I.: in [place].  Jill Short is an 81 y.o. female.  HPI: she is down here in [place] visiting her daughter. She has lived in [place] as well as [place]. She had regular colonoscopies every few years last being 2 years ago at age 89. She was told she had diverticulosis. She is not aware of any polyps. She was told that after age 40 she did not need any further colonoscopies. She was recently diagnosed with atrial fibrillation had a pacemaker placed and was started on Eliquis  2 months ago. She was admitted yesterday after painless hematochezia and has had bright red blood in the toilet during the night none so far this morning. She did get to units of blood and her Eliquis has been held she has also been on some nonsteroidal's for arthritic pain which he takes intermittently. She is not having any abdominal pain whatsoever. She's had no preceding melanoma etc. To the best of her knowledge and her daughter's knowledge she is not had colon polyps and no one in the family has had colonic malignancy.  Past Medical History  Diagnosis Date  . Coronary artery disease   . Pacemaker     History reviewed. No pertinent past surgical history.  History reviewed. No pertinent family history.  Social History:  reports that she has never smoked. She does not have any smokeless tobacco history on file. She reports that she does not drink alcohol. Her drug history is not on file.  Allergies: No Known Allergies  Medications; Prior to Admission medications   Medication Sig Start Date End Date Taking? Authorizing Provider  apixaban (ELIQUIS) 2.5 MG TABS tablet Take 2.5 mg by mouth 2 (two) times daily.   Yes Historical Provider, MD  aspirin EC 81 MG tablet Take 81 mg by mouth daily.   Yes Historical Provider, MD  bacitracin ointment Apply 1 application  topically 2 (two) times daily.   Yes Historical Provider, MD  cholecalciferol (VITAMIN D) 1000 UNITS tablet Take 1,000 Units by mouth daily.   Yes Historical Provider, MD  Cyanocobalamin (B-12 PO) Take 1 tablet by mouth daily.   Yes Historical Provider, MD  fenofibrate (TRIGLIDE) 50 MG tablet Take 50 mg by mouth daily.   Yes Historical Provider, MD  Flaxseed, Linseed, (FLAX PO) Take 1,200 mg by mouth daily.   Yes Historical Provider, MD  meloxicam (MOBIC) 7.5 MG tablet Take 7.5 mg by mouth 2 (two) times daily.   Yes Historical Provider, MD  metFORMIN (GLUCOPHAGE) 500 MG tablet Take 500 mg by mouth 2 (two) times daily with a meal.   Yes Historical Provider, MD  metoprolol tartrate (LOPRESSOR) 25 MG tablet Take 25 mg by mouth at bedtime.    Yes Historical Provider, MD  Omega-3 Fatty Acids (FISH OIL TRIPLE STRENGTH) 1400 MG CAPS Take 1,400 mg by mouth 2 (two) times daily.   Yes Historical Provider, MD  polyethylene glycol (MIRALAX / GLYCOLAX) packet Take 17 g by mouth at bedtime.   Yes Historical Provider, MD  ramipril (ALTACE) 10 MG capsule Take 10 mg by mouth daily.   Yes Historical Provider, MD  vitamin C (ASCORBIC ACID) 500 MG tablet Take 500 mg by mouth daily.   Yes Historical Provider, MD   . sodium chloride   Intravenous Once  . insulin aspart  0-9 Units Subcutaneous 6 times per day   PRN Meds  Results for  orders placed or performed during the hospital encounter of 01/01/15 (from the past 48 hour(s))  Protime-INR (if patient is taking Coumadin)     Status: Abnormal   Collection Time: 01/01/15  4:09 PM  Result Value Ref Range   Prothrombin Time 17.5 (H) 11.6 - 15.2 seconds   INR 1.42 0.00 - 1.49  CBC     Status: Abnormal   Collection Time: 01/01/15  4:09 PM  Result Value Ref Range   WBC 10.6 (H) 4.0 - 10.5 K/uL   RBC 3.09 (Short) 3.87 - 5.11 MIL/uL   Hemoglobin 9.6 (Short) 12.0 - 15.0 g/dL   HCT 29.1 (Short) 36.0 - 46.0 %   MCV 94.2 78.0 - 100.0 fL   MCH 31.1 26.0 - 34.0 pg   MCHC 33.0 30.0 -  36.0 g/dL   RDW 15.2 11.5 - 15.5 %   Platelets 318 150 - 400 K/uL  Comprehensive metabolic panel     Status: Abnormal   Collection Time: 01/01/15  4:09 PM  Result Value Ref Range   Sodium 138 135 - 145 mmol/Short   Potassium 3.9 3.5 - 5.1 mmol/Short   Chloride 108 101 - 111 mmol/Short   CO2 21 (Short) 22 - 32 mmol/Short   Glucose, Bld 162 (H) 65 - 99 mg/dL   BUN 28 (H) 6 - 20 mg/dL   Creatinine, Ser 0.91 0.44 - 1.00 mg/dL   Calcium 9.0 8.9 - 10.3 mg/dL   Total Protein 6.3 (Short) 6.5 - 8.1 g/dL   Albumin 3.2 (Short) 3.5 - 5.0 g/dL   AST 31 15 - 41 U/Short   ALT 15 14 - 54 U/Short   Alkaline Phosphatase 26 (Short) 38 - 126 U/Short   Total Bilirubin 0.8 0.3 - 1.2 mg/dL   GFR calc non Af Amer 57 (Short) >60 mL/min   GFR calc Af Amer >60 >60 mL/min    Comment: (NOTE) The eGFR has been calculated using the CKD EPI equation. This calculation has not been validated in all clinical situations. eGFR's persistently <60 mL/min signify possible Chronic Kidney Disease.    Anion gap 9 5 - 15  Type and screen     Status: None (Preliminary result)   Collection Time: 01/01/15  4:10 PM  Result Value Ref Range   ABO/RH(D) A NEG    Antibody Screen NEG    Sample Expiration 01/04/2015    Unit Number [id number]    Blood Component Type RED CELLS,LR    Unit division 00    Status of Unit ALLOCATED    Transfusion Status OK TO TRANSFUSE    Crossmatch Result Compatible    Unit Number [id number]    Blood Component Type RED CELLS,LR    Unit division 00    Status of Unit ISSUED    Transfusion Status OK TO TRANSFUSE    Crossmatch Result Compatible    Unit Number [id number]    Blood Component Type RBC LR PHER1    Unit division 00    Status of Unit ISSUED    Transfusion Status OK TO TRANSFUSE    Crossmatch Result Compatible   ABO/Rh     Status: None   Collection Time: 01/01/15  4:10 PM  Result Value Ref Range   ABO/RH(D) A NEG   POC occult blood, ED     Status: Abnormal   Collection Time: 01/01/15  5:24 PM  Result Value Ref Range    Fecal Occult Bld POSITIVE (A) NEGATIVE  CBC     Status: Abnormal  Collection Time: 01/01/15 11:16 PM  Result Value Ref Range   WBC 7.5 4.0 - 10.5 K/uL   RBC 2.09 (Short) 3.87 - 5.11 MIL/uL   Hemoglobin 6.6 (LL) 12.0 - 15.0 g/dL    Comment: REPEATED TO VERIFY CRITICAL RESULT CALLED TO, READ BACK BY AND VERIFIED WITH: Short.BISHOP,RN 2359 01/01/15 M.CAMPBELL    HCT 19.5 (Short) 36.0 - 46.0 %   MCV 93.3 78.0 - 100.0 fL   MCH 31.6 26.0 - 34.0 pg   MCHC 33.8 30.0 - 36.0 g/dL   RDW 15.1 11.5 - 15.5 %   Platelets 230 150 - 400 K/uL  Prepare RBC     Status: None   Collection Time: 01/01/15 11:27 PM  Result Value Ref Range   Order Confirmation ORDER PROCESSED BY BLOOD BANK   MRSA PCR Screening     Status: None   Collection Time: 01/02/15 12:33 AM  Result Value Ref Range   MRSA by PCR NEGATIVE NEGATIVE    Comment:        The GeneXpert MRSA Assay (FDA approved for NASAL specimens only), is one component of a comprehensive MRSA colonization surveillance program. It is not intended to diagnose MRSA infection nor to guide or monitor treatment for MRSA infections.   CBC     Status: Abnormal   Collection Time: 01/02/15 12:56 AM  Result Value Ref Range   WBC 9.5 4.0 - 10.5 K/uL   RBC 2.04 (Short) 3.87 - 5.11 MIL/uL   Hemoglobin 6.5 (LL) 12.0 - 15.0 g/dL    Comment: REPEATED TO VERIFY CRITICAL VALUE NOTED.  VALUE IS CONSISTENT WITH PREVIOUSLY REPORTED AND CALLED VALUE.    HCT 19.3 (Short) 36.0 - 46.0 %   MCV 94.6 78.0 - 100.0 fL   MCH 31.9 26.0 - 34.0 pg   MCHC 33.7 30.0 - 36.0 g/dL   RDW 15.0 11.5 - 15.5 %   Platelets 229 150 - 400 K/uL  Glucose, capillary     Status: Abnormal   Collection Time: 01/02/15  1:50 AM  Result Value Ref Range   Glucose-Capillary 143 (H) 65 - 99 mg/dL  Prepare RBC     Status: None   Collection Time: 01/02/15  2:00 AM  Result Value Ref Range   Order Confirmation BB SAMPLE OR UNITS ALREADY AVAILABLE   Glucose, capillary     Status: Abnormal   Collection Time:  01/02/15  4:49 AM  Result Value Ref Range   Glucose-Capillary 118 (H) 65 - 99 mg/dL  APTT     Status: None   Collection Time: 01/02/15  7:21 AM  Result Value Ref Range   aPTT 25 24 - 37 seconds  CBC     Status: Abnormal   Collection Time: 01/02/15  7:21 AM  Result Value Ref Range   WBC 13.9 (H) 4.0 - 10.5 K/uL   RBC 2.77 (Short) 3.87 - 5.11 MIL/uL   Hemoglobin 8.6 (Short) 12.0 - 15.0 g/dL    Comment: REPEATED TO VERIFY POST TRANSFUSION SPECIMEN    HCT 24.3 (Short) 36.0 - 46.0 %   MCV 87.7 78.0 - 100.0 fL    Comment: REPEATED TO VERIFY POST TRANSFUSION SPECIMEN    MCH 31.0 26.0 - 34.0 pg   MCHC 35.4 30.0 - 36.0 g/dL   RDW 16.9 (H) 11.5 - 15.5 %   Platelets 175 150 - 400 K/uL  Glucose, capillary     Status: Abnormal   Collection Time: 01/02/15  8:19 AM  Result Value Ref Range  Glucose-Capillary 120 (H) 65 - 99 mg/dL    No results found.            Blood pressure 115/56, pulse 91, temperature 97.4 F (36.3 C), temperature source Oral, resp. rate 18, height _0  (1.702 m), weight 61.5 kg (135 lb 9.3 oz), SpO2 100 %.  Physical exam:   General-- alert reasonably oriented white female in no distress ENT-- nonenteric Neck-- the lymphadenopathy Heart-- regular rate and rhythm without murmurs or gallops Lungs-- clear Abdomen-- soft completely nontender Psych-- alert and oriented and completely appropriate answers questions appropriately   Assessment: 1. Acute hematochezia. This is in the face of a woman who is recently been anticoagulated due to atrial fibrillation. Previous colonoscopies have shown diverticulosis. I suspect that she has a diverticular bleeding exacerbated by anticoagulation. 2. Atrial fibrillation. New onset and has had recent pacemaker and anticoagulation  Plan: 1. Will go ahead and start the patient on clear liquids and Miralax. If her bleeding stops she may not need any further work. If not, we may need to consider another colonoscopy.   Jill Short  JR,Jill Short 01/02/2015, 9:41 AM   Pager: [phone] If no answer or after hours call [phone]

## 2015-01-02 NOTE — Progress Notes (Signed)
Per report given to me by Night shift RN, patient had 5-6 loose bright red red bloody stools throughout the night, the last stool occurrence being at 0600 am.   MD made aware on rounds this morning.  Patient has no complaints this morning. Denies abd pain or cramping.   Will document any bloody stools that I see.    Roxan Hockey, RN

## 2015-01-02 NOTE — Progress Notes (Signed)
Gi orders for Miralax in place.   Patient had a total of 2 Bright red bloody stool for this 7a-7p shift. Last stool at approx 5pm. Stool amounts total to approx 1000 mL combined.   Orders to transfuse 2 units of Packed Red blood cells.   Last loose stool occurred before the 1st blood transfusion was started.   Repeat CBC to follow at 2400 (6/14)  Patient still denies abd pain or cramping.

## 2015-01-02 NOTE — Progress Notes (Signed)
Patient medical records obtained from the office of Dr. Tressie Ellis in [place] in regards to colonoscopy done in [place]. Records placed in  Patient chart.  Roxan Hockey, RN

## 2015-01-02 NOTE — Progress Notes (Signed)
Subjective:    Currently, the patient reports feeling much better than she did prior to admission. Her daughter also reports that she looks better. However, she has continued to have 4-5 bloody bowel movements overnight. She denies any epigastric pain, nausea, or vomiting. She also denies any melena or foul-smelling stool, but she does not look at her bowel movements and reports difficulty with her sense of smell.  Interval Events: -Received 2 units of packed red blood cells with hemoglobin 8.6 posttransfusion.  -Bloody bowel movement of approximately 600 mL at 1100.  -Repeat CBC this afternoon at 1300 was 7.2.  -Seen by GI who recommends observation for now.    Objective:    Vital Signs:   Temp:  [97.4 F (36.3 C)-98.7 F (37.1 C)] 98.7 F (37.1 C) (06/13 1257) Pulse Rate:  [62-94] 79 (06/13 1257) Resp:  [10-31] 14 (06/13 1257) BP: (65-131)/(38-74) 105/53 mmHg (06/13 1257) SpO2:  [97 %-100 %] 100 % (06/13 1257) Weight:  [135 lb 9.3 oz (61.5 kg)] 135 lb 9.3 oz (61.5 kg) (06/13 0030) Last BM Date: 01/02/15  24-hour weight change: Weight change:   Intake/Output:   Intake/Output Summary (Last 24 hours) at 01/02/15 1533 Last data filed at 01/02/15 1200  Gross per 24 hour  Intake 2522.5 ml  Output    604 ml  Net 1918.5 ml      Physical Exam: General: Elderly, pale skin with improved conjunctival pallor.   Lungs:  Normal respiratory effort. Clear to auscultation BL without crackles or wheezes.  Heart: RRR. S1 and S2 normal without gallop, murmur, or rubs.  Abdomen:  BS normoactive. Soft, Nondistended, non-tender.  No masses or organomegaly.  Extremities: No pretibial edema.     Labs:  Basic Metabolic Panel:  Recent Labs Lab 01/01/15 1609  NA 138  K 3.9  CL 108  CO2 21*  GLUCOSE 162*  BUN 28*  CREATININE 0.91  CALCIUM 9.0    Liver Function Tests:  Recent Labs Lab 01/01/15 1609  AST 31  ALT 15  ALKPHOS 26*  BILITOT 0.8  PROT 6.3*  ALBUMIN 3.2*    CBC:  Recent Labs Lab 01/01/15 1609 01/01/15 2316 01/02/15 0056 01/02/15 0721 01/02/15 1314  WBC 10.6* 7.5 9.5 13.9* 13.0*  HGB 9.6* 6.6* 6.5* 8.6* 7.2*  HCT 29.1* 19.5* 19.3* 24.3* 20.8*  MCV 94.2 93.3 94.6 87.7 87.0  PLT 318 230 229 175 186   CBG:  Recent Labs Lab 01/02/15 0150 01/02/15 0449 01/02/15 0819 01/02/15 1256  GLUCAP 143* 118* 120* 44*    Microbiology: Results for orders placed or performed during the hospital encounter of 01/01/15  MRSA PCR Screening     Status: None   Collection Time: 01/02/15 12:33 AM  Result Value Ref Range Status   MRSA by PCR NEGATIVE NEGATIVE Final    Comment:        The GeneXpert MRSA Assay (FDA approved for NASAL specimens only), is one component of a comprehensive MRSA colonization surveillance program. It is not intended to diagnose MRSA infection nor to guide or monitor treatment for MRSA infections.    Coagulation Studies:  Recent Labs  01/01/15 1609  LABPROT 17.5*  INR 1.42   Imaging: No results found.     Medications:    Infusions:    Scheduled Medications: . sodium chloride   Intravenous Once  . sodium chloride   Intravenous Once  . insulin aspart  0-9 Units Subcutaneous 6 times per day  . pantoprazole (PROTONIX) IV  40 mg Intravenous Q24H  . polyethylene glycol  17 g Oral TID    PRN Medications:    Assessment/ Plan:    Active Problems:   GI bleed   Pacemaker   DM (diabetes mellitus)   Atrial fibrillation   Anticoagulated by anticoagulation treatment   Diverticulosis hx of  #Lower GI bleed Given her history of diverticulosis and absence of epigastric pain or vomiting, this is most likely a diverticular bleed. She was seen by GI who recommends observation at this point along with starting a clear liquid diet and MiraLAX. She may need a colonoscopy if her bleeding does not stop. It appears that her bleeding is slowed down somewhat, but she had an additional bloody bowel movement late  this morning. Given her ongoing bleeding and hemoglobin of 7.2 this afternoon, we will transfuse her again. Given her use of meloxicam, we will start her on a PPI though low suspicion for upper GI bleed currently. -2 units of packed red blood cells. -Check posttransfusion CBC and CBCs every 6 hours. -Continue to hold Eliquis and meloxicam. -Protonix 40 mg IV daily. -Clear liquid diet per GI. -MiraLAX per GI.  #Type 2 diabetes Glucose well controlled currently. -Continue to hold home metformin 500 mg daily. -Sliding scale insulin sensitive 3 times a day with meals.  #Hypertension -Hold home Lopressor 25 mg daily at bedtime and ramipril 10 mg daily in setting of GI bleed.  #Atrial fibrillation with pacemaker Rates currently well controlled. -Hold home Lopressor and Eliquis in the setting of GI bleed.   DVT PPX - SCD's while in bed  CODE STATUS - Full  CONSULTS PLACED - GI  DISPO - Disposition is deferred at this time, awaiting improvement of GI bleed.   Anticipated discharge in approximately 2-4 day(s).   The patient does have a current PCP (Jolene Cook in [place]) and does not need an [facility] hospital follow-up appointment after discharge.    Is the [facility] hospital follow-up appointment a one-time only appointment? not applicable.  Does the patient have transportation limitations that hinder transportation to clinic appointments? yes   SERVICE NEEDED AT [place]         Y = Yes, Blank = No PT:   OT:   RN:   Equipment:   Other:      Length of Stay: 1 day(s)   Signed: Charlesetta Shanks, MD  PGY-1, Internal Medicine Resident Pager: [phone] (7AM-5PM) 01/02/2015, 3:33 PM

## 2015-01-02 NOTE — H&P (Signed)
Internal Medicine Attending Admission Note Date: 01/02/2015  Patient name: Jill Short Medical record number: [id number] Date of birth: 06-04-1931 Age: 81 y.o. Gender: female  I saw and evaluated the patient. I reviewed the resident's note and I agree with the resident's findings and plan as documented in the resident's note.  Chief Complaint(s): Bright red blood per rectum 1 day.  History - key components related to admission:  Ms. Decuir is an 81 year old woman with a history of diverticulosis, atrial fibrillation on eliquis, hypertension, hyperlipidemia, and diabetes who presents with a one-day history of bright red blood per rectum. 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 She was visiting her family in [place] when she developed bright red blood per rectum that she initially noticed when she wiped. She had several more bowel movements and it was noted by others that there was bright red blood. She does not look at her stools and has no sense of smell. She therefore cannot say if her previous stools were melanic or if they smelled horribly. She has no previous history of peptic ulcer disease or lower GI bleeds. Her last colonoscopy was within the last 2 years and was reportedly only notable for diverticulosis. She has a history of arthritis and does take as needed meloxicam. She only drinks occasionally. She denies any nausea, vomiting, or hematemesis. She is without other acute complaints at this time.  Physical Exam - key components related to admission:  Filed Vitals:   01/02/15 0700 01/02/15 0730 01/02/15 0830 01/02/15 1257  BP: 118/61 101/55 115/56 105/53  Pulse: 81 76 91 79  Temp:  97.4 F (36.3 C)  98.7 F (37.1 C)  TempSrc:  Oral  Oral  Resp: 14 18 18 14   Height:      Weight:      SpO2: 100% 99% 100% 100%   Gen.: Well-developed, well-nourished, elderly woman lying comfortably in bed in no acute distress. Lungs: Clear to auscultation  bilaterally without wheezes, rhonchi, or rales. Abdomen: Soft, nontender, active bowel sounds without guarding or rebound. Extremities: Without edema  Lab results:  Basic Metabolic Panel:  Recent Labs  01/01/15 1609  NA 138  K 3.9  CL 108  CO2 21*  GLUCOSE 162*  BUN 28*  CREATININE 0.91  CALCIUM 9.0   Liver Function Tests:  Recent Labs  01/01/15 1609  AST 31  ALT 15  ALKPHOS 26*  BILITOT 0.8  PROT 6.3*  ALBUMIN 3.2*   CBC:  Recent Labs  01/02/15 0056 01/02/15 0721  WBC 9.5 13.9*  HGB 6.5* 8.6*  HCT 19.3* 24.3*  MCV 94.6 87.7  PLT 229 175   CBG:  Recent Labs  01/02/15 0150 01/02/15 0449 01/02/15 0819 01/02/15 1256  GLUCAP 143* 118* 120* 137*   Coagulation:  Recent Labs  01/01/15 1609  INR 1.42   Misc. Labs:  FOBT positive  Other results:  EKG: Atrial fibrillation at 78 bpm with occasional ventricularly paced complexes and premature ventricular complexes. Normal axis, normal intervals, no LVH by voltage, significant Q waves laterally in the limb leads, good R wave progression, inferolateral T wave inversions in a strain pattern. No comparisons immediately available.  Assessment & Plan by Problem:  Ms. Bratton is an 81 year old woman with a history of diverticulosis, atrial fibrillation on anticoagulation, hypertension, hyperlipidemia, and diabetes mellitus who presents with a one-day history of bright red blood per rectum and significant anemia requiring 2 units of packed red blood cells. She has no  signs or symptoms of an upper GI bleed and looks too hemodynamically stable to suggest she has a brisk upper GI bleed. She therefore likely is having a diverticular bleed. She is currently hemodynamically stable.  1) Presumed diverticular bleed: We'll maintain an active type and screen and follow serial hemoglobins. We will transfuse if her hemoglobin falls below 7.0 again or if she has active bleeding even though she may have a higher hemoglobin  level. GI has been consulted and is aware. 2 wide bore IVs will be maintained at all times and she will remain in the step down unit for at least another 24 hours. Her anticoagulation for atrial fibrillation has been held. If she stops bleeding spontaneously no further intervention is likely necessary. If she continues to bleed she may require endoscopic evaluation and intervention or surgical intervention.  2) Other chronic medical problems: We are holding her antihypertensives while she is actively bleeding in addition to her anticoagulation as noted above. We have also discontinued her non-steroidal anti-inflammatory agent in the acute setting and placing her on a PPI in case she has an upper GI bleed, although this is felt to be of low likelihood clinically.  3) Disposition: Pending cessation of presumed diverticular bleeding.

## 2015-01-02 NOTE — Progress Notes (Signed)
Per report given to me, Night shift nurse transfused 2 units of packed RBC's over night.  Roxan Hockey, RN

## 2015-01-02 NOTE — Progress Notes (Signed)
Roxan Hockey, RN Registered Nurse   Progress Notes 01/02/2015     Expand All Collapse All   Release of patient healthcare information authorization form faxed to the office of Dr. Tressie Ellis in [place].  Fax: [phone]

## 2015-01-03 LAB — CBC
HCT: 25.1 % — ABNORMAL LOW (ref 36.0–46.0)
HCT: 25.8 % — ABNORMAL LOW (ref 36.0–46.0)
HEMATOCRIT: 24.9 % — AB (ref 36.0–46.0)
HEMATOCRIT: 26.2 % — AB (ref 36.0–46.0)
HEMOGLOBIN: 8.7 g/dL — AB (ref 12.0–15.0)
HEMOGLOBIN: 8.9 g/dL — AB (ref 12.0–15.0)
Hemoglobin: 8.9 g/dL — ABNORMAL LOW (ref 12.0–15.0)
Hemoglobin: 9.1 g/dL — ABNORMAL LOW (ref 12.0–15.0)
MCH: 30.2 pg (ref 26.0–34.0)
MCH: 30.7 pg (ref 26.0–34.0)
MCH: 30.2 pg (ref 26.0–34.0)
MCH: 30.5 pg (ref 26.0–34.0)
MCHC: 34.9 g/dL (ref 30.0–36.0)
MCHC: 35.5 g/dL (ref 30.0–36.0)
MCHC: 34.5 g/dL (ref 30.0–36.0)
MCHC: 34.7 g/dL (ref 30.0–36.0)
MCV: 86.5 fL (ref 78.0–100.0)
MCV: 86.6 fL (ref 78.0–100.0)
MCV: 87.5 fL (ref 78.0–100.0)
MCV: 87.9 fL (ref 78.0–100.0)
Platelets: 139 10*3/uL — ABNORMAL LOW (ref 150–400)
Platelets: 141 10*3/uL — ABNORMAL LOW (ref 150–400)
Platelets: 165 10*3/uL (ref 150–400)
Platelets: 180 10*3/uL (ref 150–400)
RBC: 2.88 MIL/uL — AB (ref 3.87–5.11)
RBC: 2.9 MIL/uL — ABNORMAL LOW (ref 3.87–5.11)
RBC: 2.95 MIL/uL — AB (ref 3.87–5.11)
RBC: 2.98 MIL/uL — AB (ref 3.87–5.11)
RDW: 16 % — ABNORMAL HIGH (ref 11.5–15.5)
RDW: 16 % — ABNORMAL HIGH (ref 11.5–15.5)
RDW: 16.3 % — ABNORMAL HIGH (ref 11.5–15.5)
RDW: 16.3 % — ABNORMAL HIGH (ref 11.5–15.5)
WBC: 13.1 10*3/uL — ABNORMAL HIGH (ref 4.0–10.5)
WBC: 11.9 10*3/uL — ABNORMAL HIGH (ref 4.0–10.5)
WBC: 11.9 10*3/uL — ABNORMAL HIGH (ref 4.0–10.5)
WBC: 11.3 10*3/uL — AB (ref 4.0–10.5)

## 2015-01-03 LAB — TYPE AND SCREEN
ABO/RH(D): A NEG
Antibody Screen: NEGATIVE
UNIT DIVISION: 0
UNIT DIVISION: 0
Unit division: 0
Unit division: 0

## 2015-01-03 LAB — GLUCOSE, CAPILLARY
GLUCOSE-CAPILLARY: 89 mg/dL (ref 65–99)
GLUCOSE-CAPILLARY: 85 mg/dL (ref 65–99)
Glucose-Capillary: 107 mg/dL — ABNORMAL HIGH (ref 65–99)
Glucose-Capillary: 81 mg/dL (ref 65–99)
Glucose-Capillary: 87 mg/dL (ref 65–99)
Glucose-Capillary: 90 mg/dL (ref 65–99)

## 2015-01-03 LAB — TROPONIN I
Troponin I: <0.03 ng/mL (ref <0.031)
Troponin I: <0.03 ng/mL (ref <0.031)

## 2015-01-03 NOTE — Progress Notes (Signed)
Subjective:    The patient reports feeling well this morning. She denies any bloody bowel movements this morning, but she had a couple of bloody bowel movements yesterday.  Interval Events: -CBC at 1300 yesterday 7.2. -Patient received 2 units of packed red blood cells yesterday afternoon. -Bright red blood per rectum at 1700 and 2330. -CBC 8.7 at 100 this morning.    Objective:    Vital Signs:   Temp:  [97.8 F (36.6 C)-98.7 F (37.1 C)] 97.8 F (36.6 C) (06/14 0414) Pulse Rate:  [62-94] 71 (06/14 0414) Resp:  [14-24] 17 (06/14 0414) BP: (98-137)/(47-103) 125/63 mmHg (06/14 0414) SpO2:  [84 %-100 %] 99 % (06/14 0414) Weight:  [140 lb 10.5 oz (63.8 kg)] 140 lb 10.5 oz (63.8 kg) (06/14 0414) Last BM Date: 01/02/15  24-hour weight change: Weight change: 6 lb 10.5 oz (3.018 kg)  Intake/Output:   Intake/Output Summary (Last 24 hours) at 01/03/15 0855 Last data filed at 01/02/15 2315  Gross per 24 hour  Intake 1551.67 ml  Output   1100 ml  Net 451.67 ml      Physical Exam: General: Elderly, resting comfortably.  Lungs:  Normal respiratory effort. Clear to auscultation BL without crackles or wheezes.  Heart: RRR. S1 and S2 normal without gallop, murmur, or rubs.  Abdomen:  BS normoactive. Soft, Nondistended, non-tender.  No masses or organomegaly.  Extremities: No pretibial edema.     Labs:  Basic Metabolic Panel:  Recent Labs Lab 01/01/15 1609  NA 138  K 3.9  CL 108  CO2 21*  GLUCOSE 162*  BUN 28*  CREATININE 0.91  CALCIUM 9.0    Liver Function Tests:  Recent Labs Lab 01/01/15 1609  AST 31  ALT 15  ALKPHOS 26*  BILITOT 0.8  PROT 6.3*  ALBUMIN 3.2*   CBC:  Recent Labs Lab 01/02/15 0056 01/02/15 0721 01/02/15 1314 01/03/15 0108 01/03/15 0731  WBC 9.5 13.9* 13.0* 13.1* 11.9*  HGB 6.5* 8.6* 7.2* 8.7* 8.9*  HCT 19.3* 24.3* 20.8* 24.9* 25.1*  MCV 94.6 87.7 87.0 86.5 86.6  PLT 229 175 186 139* 141*   CBG:  Recent Labs Lab  01/02/15 1723 01/02/15 2055 01/03/15 0011 01/03/15 0413 01/03/15 0816  GLUCAP 120* 115* 107* 69 90    Microbiology: Results for orders placed or performed during the hospital encounter of 01/01/15  MRSA PCR Screening     Status: None   Collection Time: 01/02/15 12:33 AM  Result Value Ref Range Status   MRSA by PCR NEGATIVE NEGATIVE Final    Comment:        The GeneXpert MRSA Assay (FDA approved for NASAL specimens only), is one component of a comprehensive MRSA colonization surveillance program. It is not intended to diagnose MRSA infection nor to guide or monitor treatment for MRSA infections.    Coagulation Studies:  Recent Labs  01/01/15 1609  LABPROT 17.5*  INR 1.42   Imaging: No results found.     Medications:    Infusions:    Scheduled Medications: . sodium chloride   Intravenous Once  . insulin aspart  0-9 Units Subcutaneous TID WC  . pantoprazole (PROTONIX) IV  40 mg Intravenous Q24H  . polyethylene glycol  17 g Oral TID    PRN Medications:    Assessment/ Plan:    Active Problems:   GI bleed   Pacemaker   DM (diabetes mellitus)   Atrial fibrillation   Anticoagulated by anticoagulation treatment   Diverticulosis hx of  #Lower  GI bleed Continues to have GI bleeding, but her frequency of bloody bowel movements appears to be decreasing. -Continue to monitor CBC every 6 hours. -Transfuse if hemoglobin less than 7. -Continue to hold Eliquis and meloxicam. -Continue Protonix 40 mg IV daily, she will need a PPI at discharge if ongoing NSAID use. -Clear liquid diet. -Continue MiraLAX 3 times a day.  #Type 2 diabetes Glucose well controlled currently. -Continue to hold home metformin 500 mg daily. -Sliding scale insulin sensitive 3 times a day with meals.  #Hypertension Normotensive. -Hold home Lopressor 25 mg daily at bedtime and ramipril 10 mg daily in setting of GI bleed.  #Atrial fibrillation with pacemaker Rates continue to be  well controlled. -Hold home Lopressor and Eliquis in the setting of GI bleed.   DVT PPX - SCD's while in bed  CODE STATUS - Full  CONSULTS PLACED - GI  DISPO - Disposition is deferred at this time, awaiting improvement of GI bleed.   Anticipated discharge in approximately 2-4 day(s).   The patient does have a current PCP (Jolene Cook in [place]) and does not need an [facility] hospital follow-up appointment after discharge.    Is the [facility] hospital follow-up appointment a one-time only appointment? not applicable.  Does the patient have transportation limitations that hinder transportation to clinic appointments? yes   SERVICE NEEDED AT [place]         Y = Yes, Blank = No PT:   OT:   RN:   Equipment:   Other:      Length of Stay: 2 day(s)   Signed: Charlesetta Shanks, MD  PGY-1, Internal Medicine Resident Pager: [phone] (7AM-5PM) 01/03/2015, 8:55 AM

## 2015-01-03 NOTE — Clinical Documentation Improvement (Signed)
Supporting Information:  Patient presents with a one-day history of bright red blood per rectum and significant anemia requiring 2 units of packed red blood cells per 6/13 progress notes.  Labs:  H/H: 6/13:  6.5/19.3 6/14:  8.7/24.9  Treatment: 2 units PRBC transfused.   Possible Clinical Condition: . Documentation of Anemia should include the type of anemia: --Nutritional --Hemolytic --Aplastic --Acute blood loss anemia --Acute on chronic blood loss anemia --Other (please specify) . Include in documentation if Anemia is due to nutrition or mineral deficits, resulting in a nutritional anemia . Document if the Anemia is due to a neoplasm (primary and/or secondary) . Document whether the ANEMIA is "related to or due to" chemo or radiotherapy treatments . Document any "cause-and-effect" relationship between the intervention and the blood or immune disorder . Document the specific drug if anemia is drug-induced . Link any laboratory findings to a related diagnosis (if appropriate) . Document any associated diagnoses/conditions     Thank Sherian Maroon Documentation Specialist [phone] [email or web address]

## 2015-01-03 NOTE — Progress Notes (Addendum)
Internal Medicine Night Float Interim Progress Note  S: Called by nursing at 1420 that patient had left-sided chest pain.  Patient reports dull left sided chest pressure beneath her telemetry lead that came on at rest.  She says the pain feels like it is deep in her chest.  It has been coming and going intermittently for past hour.  She denies any similar pain in the past.  She reports having a bloody bowel movement a few minutes ago.  O: Filed Vitals:   01/03/15 1146  BP: 116/66  Pulse: 74  Temp: 98.0  Resp: 22  100% on room air.  Physical Exam  Constitutional: She is well-developed, well-nourished, and in no distress. No distress.  Cardiovascular:  No murmur heard. Irregularly, irregular.  Pulmonary/Chest: She exhibits tenderness (Mild tendernes with palpation of telemetry lead.).  Skin: She is not diaphoretic.   EKG: Occasionally ventricularly paced rhythm, no ST elevation.  T wave changes depending on pacing.  A/P: Possibly musculoskeletal after walking with PT today, located beneath telemetry lead.  Somewhat reproducible on exam.  Not typical for GERD, but could be reflux.  At risk for demand ischemia due to GI bleed.  EKG nonspecific due to pacing.  Hgb stable on recheck this afternoon despite reported bloody bowel movement.   Treatment would be difficult if troponin elevated given ongoing GI bleed and inability to put on heparin currently. -Check troponin now and in 6 hours. -Continue Protonix 40 mg daily. -Tylenol 650 mg q6h PRN. -Continue to monitor Hgb every 6 hours overnight. -Would seek cardiology input if troponins elevated given ongoing GI bleed.  Arman Filter, MD, PhD Internal Medicine Intern Pager: [phone] 01/03/2015,3:17 PM

## 2015-01-03 NOTE — Evaluation (Signed)
Occupational Therapy Evaluation Patient Details Name: Jill Short MRN: [id number] DOB: 02-Jun-1931 Today's Date: 01/03/2015    History of Present Illness Jill Short is an 81 year old woman with a history of diverticulosis, atrial fibrillation on eliquis, hypertension, hyperlipidemia, and diabetes who presents with a one-day history of bright red blood per rectum. 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 She was visiting her family in [place] when she developed bright red blood per rectum that she initially noticed when she wiped. She had several more bowel movements and it was noted by others that there was bright red blood. She does not look at her stools and has no sense of smell. She therefore cannot say if her previous stools were melanic or if they smelled horribly. She has no previous history of peptic ulcer disease or lower GI bleeds. Her last colonoscopy was within the last 2 years and was reportedly only notable for diverticulosis. She has a history of arthritis and does take as needed meloxicam. She only drinks occasionally. She denies any nausea, vomiting, or hematemesis. She is without other acute complaints at this time.   Clinical Impression   Pt was independent prior to admission.  Presents with generalized weakness and mild balance impairment interfering with ability to perform self care and ADL transfers.  Pt with bright red BM, RN made aware.  Will follow acutely. Do not anticipate pt will required post acute OT.    Follow Up Recommendations  No OT follow up    Equipment Recommendations  None recommended by OT    Recommendations for Other Services       Precautions / Restrictions Precautions Precautions: Fall Restrictions Weight Bearing Restrictions: No      Mobility Bed Mobility               General bed mobility comments: pt up in chair  Transfers Overall transfer level: Needs assistance   Transfers: Stand Pivot  Transfers   Stand pivot transfers: Supervision       General transfer comment: stand by assist for safety, managing lines and gown    Balance Overall balance assessment: Needs assistance           Standing balance-Jill Short Scale: Poor                 High Level Balance Comments: reaches for objects to stabilize with transfer            ADL Overall ADL's : Needs assistance/impaired Eating/Feeding: Independent;Sitting   Grooming: Wash/dry hands;Supervision/safety;Standing   Upper Body Bathing: Set up;Sitting   Lower Body Bathing: Supervison/ safety;Sit to/from stand   Upper Body Dressing : Set up;Sitting   Lower Body Dressing: Supervision/safety;Sit to/from stand   Toilet Transfer: Supervision/safety;Stand-pivot;BSC   Toileting- Water quality scientist and Hygiene: Maximal assistance;Sit to/from stand               Vision     Perception     Praxis      Pertinent Vitals/Pain Pain Assessment: No/denies pain     Hand Dominance Right   Extremity/Trunk Assessment Upper Extremity Assessment Upper Extremity Assessment: Overall WFL for tasks assessed   Lower Extremity Assessment Lower Extremity Assessment: Defer to PT evaluation   Cervical / Trunk Assessment Cervical / Trunk Assessment: Normal   Communication Communication Communication: No difficulties   Cognition Arousal/Alertness: Awake/alert Behavior During Therapy: WFL for tasks assessed/performed Overall Cognitive Status: Within Functional Limits for tasks assessed  General Comments       Exercises       Shoulder Instructions      Home Living Family/patient expects to be discharged to:: Private residence Living Arrangements: Spouse/significant other Available Help at Discharge: Family;Available 24 hours/day Type of Home: Other(Comment) (motor home) Home Access: Stairs to enter Entrance Stairs-Number of Steps: 3 Entrance Stairs-Rails: Right Home  Layout: One level     Bathroom Shower/Tub: Occupational psychologist: Standard     Home Equipment: None          Prior Functioning/Environment Level of Independence: Independent             OT Diagnosis: Generalized weakness   OT Problem List: Decreased activity tolerance;Impaired balance (sitting and/or standing)   OT Treatment/Interventions: Self-care/ADL training;Patient/family education    OT Goals(Current goals can be found in the care plan section) Acute Rehab OT Goals Patient Stated Goal: to get better OT Goal Formulation: With patient Time For Goal Achievement: 01/10/15 Potential to Achieve Goals: Good ADL Goals Pt Will Perform Grooming: Independently;standing Pt Will Perform Lower Body Bathing: Independently;sit to/from stand Pt Will Perform Lower Body Dressing: Independently;sit to/from stand Pt Will Transfer to Toilet: Independently;ambulating;regular height toilet Pt Will Perform Toileting - Clothing Manipulation and hygiene: Independently;sit to/from stand Pt Will Perform Tub/Shower Transfer: Shower transfer;ambulating  OT Frequency: Min 2X/week   Barriers to D/C:            Co-evaluation              End of Session Nurse Communication:  (bloody BM)  Activity Tolerance: Patient tolerated treatment well Patient left: in chair;with call bell/phone within reach   Time: 1436-1451 OT Time Calculation (min): 15 min Charges:  OT General Charges $OT Visit: 1 Procedure OT Evaluation $Initial OT Evaluation Tier I: 1 Procedure G-Codes:    Malka So 01/03/2015, 3:08 PM  [phone]

## 2015-01-03 NOTE — Progress Notes (Signed)
Pt complaint of chest discomfort.  EKG ordered.  Dr. Trudee Kuster is notified of this complaint.

## 2015-01-03 NOTE — Evaluation (Signed)
Physical Therapy Evaluation Patient Details Name: Jill Short MRN: [id number] DOB: May 03, 1931 Today's Date: 01/03/2015   History of Present Illness  Jill Short is an 81 year old woman with a history of diverticulosis, atrial fibrillation on eliquis, hypertension, hyperlipidemia, and diabetes who presents with a one-day history of bright red blood per rectum. 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 She was visiting her family in [place] when she developed bright red blood per rectum that she initially noticed when she wiped. She had several more bowel movements and it was noted by others that there was bright red blood. She does not look at her stools and has no sense of smell. She therefore cannot say if her previous stools were melanic or if they smelled horribly. She has no previous history of peptic ulcer disease or lower GI bleeds. Her last colonoscopy was within the last 2 years and was reportedly only notable for diverticulosis. She has a history of arthritis and does take as needed meloxicam. She only drinks occasionally. She denies any nausea, vomiting, or hematemesis. She is without other acute complaints at this time.  Clinical Impression  Pt admitted with above diagnosis. Pt currently with functional limitations due to the deficits listed below (see PT Problem List). Pt able to ambulate fairly well. May need a cane and HHPT safety eval.  Did desat on RA.  Will follow acutely.  Pt will benefit from skilled PT to increase their independence and safety with mobility to allow discharge to the venue listed below.      Follow Up Recommendations Supervision/Assistance - 24 hour;Home health PT (safety eval)    Equipment Recommendations  Cane    Recommendations for Other Services       Precautions / Restrictions Precautions Precautions: Fall Restrictions Weight Bearing Restrictions: No      Mobility  Bed Mobility Overal bed mobility:  Independent                Transfers Overall transfer level: Independent                  Ambulation/Gait Ambulation/Gait assistance: Min guard Ambulation Distance (Feet): 130 Feet Assistive device: None Gait Pattern/deviations: Step-through pattern;Decreased stride length;Trunk flexed   Gait velocity interpretation: Below normal speed for age/gender General Gait Details: Pt ambulated well without LOB.  Slightly unsteady at times needing min guard assist.    Stairs            Wheelchair Mobility    Modified Rankin (Stroke Patients Only)       Balance Overall balance assessment: Needs assistance         Standing balance support: No upper extremity supported;During functional activity Standing balance-Leahy Scale: Poor Standing balance comment: Needed single UE support a few times for balance.  May need cane.               High level balance activites: Sudden stops;Turns;Direction changes High Level Balance Comments: Needed min guard assist for stability a few times with ambulation.               Pertinent Vitals/Pain Pain Assessment: No/denies pain  76-113 bpm, O2 sats decr to 86% with ambulation on RA.  99-100% on RA at rest.      Home Living Family/patient expects to be discharged to:: Private residence Living Arrangements: Spouse/significant other Available Help at Discharge: Family;Available 24 hours/day Type of Home: Mobile home Home Access: Stairs to enter Entrance Stairs-Rails: Right Entrance Stairs-Number of  Steps: 3 Home Layout: One level Home Equipment: None      Prior Function Level of Independence: Independent               Hand Dominance        Extremity/Trunk Assessment   Upper Extremity Assessment: Defer to OT evaluation           Lower Extremity Assessment: Generalized weakness      Cervical / Trunk Assessment: Normal  Communication   Communication: No difficulties  Cognition  Arousal/Alertness: Awake/alert Behavior During Therapy: WFL for tasks assessed/performed Overall Cognitive Status: Within Functional Limits for tasks assessed                      General Comments      Exercises        Assessment/Plan    PT Assessment Patient needs continued PT services  PT Diagnosis Generalized weakness   PT Problem List Decreased activity tolerance;Decreased balance;Decreased mobility;Decreased knowledge of use of DME;Decreased safety awareness;Decreased knowledge of precautions  PT Treatment Interventions DME instruction;Gait training;Therapeutic activities;Therapeutic exercise;Balance training;Patient/family education;Stair training;Functional mobility training   PT Goals (Current goals can be found in the Care Plan section) Acute Rehab PT Goals Patient Stated Goal: to get better PT Goal Formulation: With patient Time For Goal Achievement: 01/17/15 Potential to Achieve Goals: Good    Frequency Min 3X/week   Barriers to discharge        Co-evaluation               End of Session Equipment Utilized During Treatment: Gait belt Activity Tolerance: Patient limited by fatigue Patient left: in chair;with call bell/phone within reach Nurse Communication: Mobility status         Time: 0955-1005 PT Time Calculation (min) (ACUTE ONLY): 10 min   Charges:   PT Evaluation $Initial PT Evaluation Tier I: 1 Procedure     PT G CodesDenice Paradise January 09, 2015, 10:59 AM Amanda Cockayne Acute Rehabilitation [phone] (pager)

## 2015-01-03 NOTE — Progress Notes (Signed)
Internal Medicine Attending  Date: 01/03/2015  Patient name: Jill Short Medical record number: [id number] Date of birth: 25-Sep-1930 Age: 81 y.o. Gender: female  I saw and evaluated the patient. I reviewed the resident's note by Dr. Trudee Kuster and I agree with the resident's findings and plans as documented in his progress note.  Ms. Well was walking the halls with PT this AM when seen on rounds.  She had no bloody bowel movements this morning when seen, the last being last night.  She did require 2 units of PRBCs last evening for a low Hgb, but her Hgb this AM was stable.  I suspect that the bleeding has stopped, in part due to the metabolism of the Eliquis over the last 24-48 hours.  We will continue to monitor in the step down unit overnight, and if stable, consider transfer to the general medical ward Vs discharge home off of anticoagulation for at least 2 weeks before reconsidering the risks and benefits of restarting.

## 2015-01-03 NOTE — Progress Notes (Signed)
[facility] PROGRESS NOTE Subjective patient reports that she had a large bowel movement with blood couple hours ago. She's talking on the phone and unable to tell me if this was bright red blood or dark. This was her only bowel movement today so far. She denies abdominal pain. She did receive 2 units of packed cells yesterday. She remains on Miralax.  Objective: Vital signs in last 24 hours: Temp:  [97.8 F (36.6 C)-98.6 F (37 C)] 98 F (36.7 C) (06/14 0800) Pulse Rate:  [68-94] 74 (06/14 1146) Resp:  [15-24] 22 (06/14 1146) BP: (98-137)/(53-103) 116/66 mmHg (06/14 1146) SpO2:  [84 %-100 %] 100 % (06/14 1146) Weight:  [63.8 kg (140 lb 10.5 oz)] 63.8 kg (140 lb 10.5 oz) (06/14 0414) Last BM Date: 01/02/15  Intake/Output from previous day: 06/13 0701 - 06/14 0700 In: 1676.7 [P.O.:360; I.V.:625; Blood:691.7] Out: 1100 [Stool:1100] Intake/Output this shift: Total I/O In: -  Out: 450 [Urine:450]  PE: General-- no acute distress talking on the telephone  Abdomen-- soft and nontender good bowel sounds  Lab Results:  Recent Labs  01/02/15 0721 01/02/15 1314 01/03/15 0108 01/03/15 0731 01/03/15 1415  WBC 13.9* 13.0* 13.1* 11.9* 11.9*  HGB 8.6* 7.2* 8.7* 8.9* 8.9*  HCT 24.3* 20.8* 24.9* 25.1* 25.8*  PLT 175 186 139* 141* 165   BMET  Recent Labs  01/01/15 1609  NA 138  K 3.9  CL 108  CO2 21*  CREATININE 0.91   LFT  Recent Labs  01/01/15 1609  PROT 6.3*  AST 31  ALT 15  ALKPHOS 26*  BILITOT 0.8   PT/INR  Recent Labs  01/01/15 1609  LABPROT 17.5*  INR 1.42   PANCREAS No results for input(s): LIPASE in the last 72 hours.       Studies/Results: No results found.  Medications: I have reviewed the patient's current medications.  Assessment/Plan: 1. G.I. bleed probable lower G.I. bleed suspect due to diverticulosis. Hemoglobin is stable and she may be simply passing out old blood, clots, and stool. Would continue Miralax and see if  her stool does not clear.   Talbert Trembath JR,Zaidyn Claire L 01/03/2015, 3:50 PM  Pager: [phone] If no answer or after hours call [phone]

## 2015-01-04 DIAGNOSIS — Z7901 Long term (current) use of anticoagulants: Secondary | ICD-10-CM

## 2015-01-04 LAB — GLUCOSE, CAPILLARY
GLUCOSE-CAPILLARY: 84 mg/dL (ref 65–99)
GLUCOSE-CAPILLARY: 87 mg/dL (ref 65–99)
GLUCOSE-CAPILLARY: 91 mg/dL (ref 65–99)
GLUCOSE-CAPILLARY: 93 mg/dL (ref 65–99)
Glucose-Capillary: 84 mg/dL (ref 65–99)
Glucose-Capillary: 85 mg/dL (ref 65–99)
Glucose-Capillary: 99 mg/dL (ref 65–99)

## 2015-01-04 LAB — CBC
HCT: 24.1 % — ABNORMAL LOW (ref 36.0–46.0)
HCT: 25.9 % — ABNORMAL LOW (ref 36.0–46.0)
HEMATOCRIT: 24 % — AB (ref 36.0–46.0)
HEMOGLOBIN: 8.4 g/dL — AB (ref 12.0–15.0)
Hemoglobin: 8.3 g/dL — ABNORMAL LOW (ref 12.0–15.0)
Hemoglobin: 8.9 g/dL — ABNORMAL LOW (ref 12.0–15.0)
MCH: 31.1 pg (ref 26.0–34.0)
MCH: 30.5 pg (ref 26.0–34.0)
MCH: 30.8 pg (ref 26.0–34.0)
MCHC: 35 g/dL (ref 30.0–36.0)
MCHC: 34.4 g/dL (ref 30.0–36.0)
MCHC: 34.4 g/dL (ref 30.0–36.0)
MCV: 88.9 fL (ref 78.0–100.0)
MCV: 88.6 fL (ref 78.0–100.0)
MCV: 89.6 fL (ref 78.0–100.0)
Platelets: 164 10*3/uL (ref 150–400)
Platelets: 172 10*3/uL (ref 150–400)
Platelets: 208 10*3/uL (ref 150–400)
RBC: 2.7 MIL/uL — ABNORMAL LOW (ref 3.87–5.11)
RBC: 2.72 MIL/uL — AB (ref 3.87–5.11)
RBC: 2.89 MIL/uL — AB (ref 3.87–5.11)
RDW: 16.3 % — ABNORMAL HIGH (ref 11.5–15.5)
RDW: 16.3 % — ABNORMAL HIGH (ref 11.5–15.5)
RDW: 16.1 % — ABNORMAL HIGH (ref 11.5–15.5)
WBC: 9.5 10*3/uL (ref 4.0–10.5)
WBC: 7.6 10*3/uL (ref 4.0–10.5)
WBC: 8.5 10*3/uL (ref 4.0–10.5)

## 2015-01-04 MED ORDER — PANTOPRAZOLE SODIUM 40 MG PO TBEC
40.0000 mg | DELAYED_RELEASE_TABLET | Freq: Every day | ORAL | Status: DC
  Administered 2015-01-04 – 2015-01-07 (×3): 40 mg via ORAL
  Filled 2015-01-04 (×3): qty 1

## 2015-01-04 NOTE — Progress Notes (Signed)
[facility] PROGRESS NOTE Subjective Pt reports brownish BM with some BRB, no pain  Objective: Vital signs in last 24 hours: Temp:  [97.7 F (36.5 C)-98.5 F (36.9 C)] 98.4 F (36.9 C) (06/15 1500) Pulse Rate:  [70-77] 73 (06/15 1500) Resp:  [16-20] 20 (06/15 1500) BP: (98-142)/(48-117) 107/48 mmHg (06/15 1500) SpO2:  [97 %-100 %] 100 % (06/15 1500) Last BM Date: 01/04/15  Intake/Output from previous day: 06/14 0701 - 06/15 0700 In: 480 [P.O.:480] Out: 2500 [Urine:2500] Intake/Output this shift: Total I/O In: 720 [P.O.:720] Out: -   PE: - Abdomen--soft and nontender  Lab Results:  Recent Labs  01/03/15 0731 01/03/15 1415 01/03/15 1900 01/04/15 0100 01/04/15 0720  WBC 11.9* 11.9* 11.3* 9.5 7.6  HGB 8.9* 8.9* 9.1* 8.4* 8.3*  HCT 25.1* 25.8* 26.2* 24.0* 24.1*  PLT 141* 165 180 164 172   BMET No results for input(s): NA, K, CL, CO2, CREATININE in the last 72 hours. LFT No results for input(s): PROT, AST, ALT, ALKPHOS, BILITOT, BILIDIR, IBILI in the last 72 hours. PT/INR No results for input(s): LABPROT, INR in the last 72 hours. PANCREAS No results for input(s): LIPASE in the last 72 hours.       Studies/Results: No results found.  Medications: I have reviewed the patient's current medications.  Assessment/Plan: 1. Hematochezia. Suspect hemorrhoids since Hg is stable but will continue the miralax and check tomorrow and if still bleeding will go ahead with full prep and colonoscopy.   Lorriane Dehart JR,Keino Placencia L 01/04/2015, 4:57 PM  Pager: [phone] If no answer or after hours call [phone]

## 2015-01-04 NOTE — Progress Notes (Signed)
Subjective:    The patient continues to feel well this morning, but she reports one bloody bowel movement last night. She reports her energy is stable, and she denies any lightheadedness. She reports that her chest discomfort has resolved.  Interval Events: -Episode of chest pain yesterday afternoon, EKG paced and unrevealing. Troponins negative 2. Now resolved. -Bloody bowel movement recorded at 1602 yesterday, patient reported additional bloody bowel movement overnight. -Vital signs stable overnight.    Objective:    Vital Signs:   Temp:  [97.7 F (36.5 C)-98.5 F (36.9 C)] 97.8 F (36.6 C) (06/15 0816) Pulse Rate:  [70-79] 70 (06/15 0816) Resp:  [16-22] 16 (06/15 0816) BP: (109-142)/(49-117) 113/60 mmHg (06/15 0816) SpO2:  [97 %-100 %] 99 % (06/15 0816) Last BM Date: 01/03/15  24-hour weight change: Weight change:   Intake/Output:   Intake/Output Summary (Last 24 hours) at 01/04/15 0856 Last data filed at 01/03/15 2200  Gross per 24 hour  Intake    480 ml  Output   2500 ml  Net  -2020 ml      Physical Exam: General: Elderly, resting comfortably.  Lungs:  Normal respiratory effort. Clear to auscultation BL without crackles or wheezes.  Heart: RRR. S1 and S2 normal without gallop, murmur, or rubs.  Abdomen:  BS normoactive. Soft, Nondistended, non-tender.  No masses or organomegaly.  Extremities: No pretibial edema.     Labs:  Basic Metabolic Panel:  Recent Labs Lab 01/01/15 1609  NA 138  K 3.9  CL 108  CO2 21*  GLUCOSE 162*  BUN 28*  CREATININE 0.91  CALCIUM 9.0    Liver Function Tests:  Recent Labs Lab 01/01/15 1609  AST 31  ALT 15  ALKPHOS 26*  BILITOT 0.8  PROT 6.3*  ALBUMIN 3.2*   CBC:  Recent Labs Lab 01/03/15 0731 01/03/15 1415 01/03/15 1900 01/04/15 0100 01/04/15 0720  WBC 11.9* 11.9* 11.3* 9.5 7.6  HGB 8.9* 8.9* 9.1* 8.4* 8.3*  HCT 25.1* 25.8* 26.2* 24.0* 24.1*  MCV 86.6 87.5 87.9 88.9 88.6  PLT 141* 165 180 164 172    CBG:  Recent Labs Lab 01/03/15 1305 01/03/15 1627 01/03/15 2108 01/04/15 0013 01/04/15 0442  GLUCAP 87 81 85 99 84    Microbiology: Results for orders placed or performed during the hospital encounter of 01/01/15  MRSA PCR Screening     Status: None   Collection Time: 01/02/15 12:33 AM  Result Value Ref Range Status   MRSA by PCR NEGATIVE NEGATIVE Final    Comment:        The GeneXpert MRSA Assay (FDA approved for NASAL specimens only), is one component of a comprehensive MRSA colonization surveillance program. It is not intended to diagnose MRSA infection nor to guide or monitor treatment for MRSA infections.    Coagulation Studies:  Recent Labs  01/01/15 1609  LABPROT 17.5*  INR 1.42   Imaging: No results found.     Medications:    Infusions:    Scheduled Medications: . sodium chloride   Intravenous Once  . insulin aspart  0-9 Units Subcutaneous TID WC  . pantoprazole (PROTONIX) IV  40 mg Intravenous Q24H  . polyethylene glycol  17 g Oral TID    PRN Medications:    Assessment/ Plan:    Active Problems:   GI bleed   Pacemaker   DM (diabetes mellitus)   Atrial fibrillation   Anticoagulated by anticoagulation treatment   Diverticulosis hx of  #Lower GI bleed Continuing to  have bloody bowel movements. Per GI, this may be secondary to previous bleeding that is now being excreted. Her hemoglobin has remained relatively stable overnight and since her last transfusion, suggesting her bleeding has slowed. We will continue conservative management at this time. Physical therapy is recommending home health. -Transfer to med-surg today. -Check CBCs every 12 hours. -Transfuse if hemoglobin less than 7. -Continue to hold Eliquis and meloxicam. -We'll plan to discharge on Tylenol for osteoarthritis. -Switched to oral Protonix 40 mg daily. -Continue clear liquid diet. -Continue MiraLAX 3 times a day. -Ordered home health PT and DME cane.  #Chest  pain Currently resolved, likely musculoskeletal. This was potentially exacerbated by working with physical therapy yesterday. -No further workup unless pain recurs.  #Type 2 diabetes Glucose well controlled currently. -Continue to hold home metformin 500 mg daily. -Sliding scale insulin sensitive 3 times a day with meals.  #Hypertension Normotensive. -Hold home Lopressor 25 mg daily at bedtime and ramipril 10 mg daily in setting of GI bleed.  #Atrial fibrillation with pacemaker Good rate control. -Hold home Lopressor and Eliquis in the setting of GI bleed.   DVT PPX - SCD's while in bed  CODE STATUS - Full  CONSULTS PLACED - GI  DISPO - Disposition is deferred at this time, awaiting improvement of GI bleed.   Anticipated discharge in approximately 2-4 day(s).   The patient does have a current PCP (Jolene Cook in [place]) and does not need an [facility] hospital follow-up appointment after discharge.    Is the [facility] hospital follow-up appointment a one-time only appointment? not applicable.  Does the patient have transportation limitations that hinder transportation to clinic appointments? yes   SERVICE NEEDED AT [facility]         Y = Yes, Blank = No PT: HH  OT:   RN:   Equipment: Cane  Other:      Length of Stay: 3 day(s)   Signed: Charlesetta Shanks, MD  PGY-1, Internal Medicine Resident Pager: [phone] (7AM-5PM) 01/04/2015, 8:56 AM

## 2015-01-04 NOTE — Progress Notes (Signed)
Patient safely transported to [facility] with RN and NT. Family aware of transportation. 6N RN at bedside.

## 2015-01-04 NOTE — Progress Notes (Signed)
Attempted report 

## 2015-01-04 NOTE — Progress Notes (Signed)
Physical Therapy Treatment Patient Details Name: Jill Short MRN: [id number] DOB: [birth year] Today's Date: 01/04/2015    History of Present Illness Jill Short is an 81 year old woman with a history of diverticulosis, atrial fibrillation on eliquis, hypertension, hyperlipidemia, and diabetes who presents with a one-day history of bright red blood per rectum. 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 She was visiting her family in [place] when she developed bright red blood per rectum that she initially noticed when she wiped. She had several more bowel movements and it was noted by others that there was bright red blood. She does not look at her stools and has no sense of smell. She therefore cannot say if her previous stools were melanic or if they smelled horribly. She has no previous history of peptic ulcer disease or lower GI bleeds. Her last colonoscopy was within the last 2 years and was reportedly only notable for diverticulosis. She has a history of arthritis and does take as needed meloxicam. She only drinks occasionally. She denies any nausea, vomiting, or hematemesis. She is without other acute complaints at this time.    PT Comments    Pt with improved ambulation tolerance but con't to demo decreased endurance and generalized weakness. Pt to benefit from HHPT to address deficits and improve balance to minimize risk of falling. P  Follow Up Recommendations  Home health PT;Supervision/Assistance - 24 hour     Equipment Recommendations  Cane    Recommendations for Other Services       Precautions / Restrictions Precautions Precautions: Fall Restrictions Weight Bearing Restrictions: No    Mobility  Bed Mobility               General bed mobility comments: pt up in chair  Transfers Overall transfer level: Needs assistance Equipment used: 1 person hand held assist Transfers: Sit to/from Stand Sit to Stand: Min guard          General transfer comment: min guard to steady during transition  Ambulation/Gait Ambulation/Gait assistance: Min assist Ambulation Distance (Feet): 150 Feet Assistive device: 1 person hand held assist;None Gait Pattern/deviations: Step-through pattern;Decreased stride length;Antalgic   Gait velocity interpretation: Below normal speed for age/gender General Gait Details: pt with mild instability and + SOB. SpO2 varied from 76-97% on RA. unsure of accuracy of pulse ox reading. took many standing rest breaks   Stairs Stairs: Yes Stairs assistance: Min assist Stair Management: One rail Left;Step to pattern Number of Stairs: 5 General stair comments: pt with good technique  Wheelchair Mobility    Modified Rankin (Stroke Patients Only)       Balance                                    Cognition Arousal/Alertness: Awake/alert Behavior During Therapy: WFL for tasks assessed/performed Overall Cognitive Status: Within Functional Limits for tasks assessed                      Exercises      General Comments General comments (skin integrity, edema, etc.): upon initial amb pt was leaking blood from either rectum or vagina. Pt assisted on BSC and urinated. RN notified      Pertinent Vitals/Pain Pain Assessment: No/denies pain    Home Living  Prior Function            PT Goals (current goals can now be found in the care plan section) Progress towards PT goals: PT to reassess next treatment    Frequency  Min 3X/week    PT Plan Current plan remains appropriate    Co-evaluation             End of Session Equipment Utilized During Treatment: Gait belt Activity Tolerance: Patient limited by fatigue Patient left: in chair;with call bell/phone within reach     Time: 1353-1424 PT Time Calculation (min) (ACUTE ONLY): 31 min  Charges:  $Gait Training: 8-22 mins $Therapeutic Activity: 8-22 mins                     G Codes:      Jill Short 01/04/2015, 3:53 PM  Jill Short, PT, DPT Pager #: [phone] Office #: [phone]

## 2015-01-04 NOTE — Progress Notes (Signed)
Internal Medicine Attending  Date: 01/04/2015  Patient name: Jill Short Medical record number: [id number] Date of birth: [birth year] Age: 81 y.o. Gender: female  I saw and evaluated the patient. I reviewed the resident's note by Dr. Trudee Kuster and I agree with the resident's findings and plans as documented in his progress note.  When seen on rounds this morning she was without acute complaints but did note she continued to have bowel movements with brown stool mixed with red blood clots. That being said, her hemoglobin is stable at 8.9 this evening suggesting these may be from her previous bleed that she is just now passing through. GI is continuing to follow and were she to bleed again or have falling hemoglobins they would consider colonoscopy at that time. We will continue with serial hemoglobins and close observation. I anticipate, assuming no further bleeding, she should be ready for discharge in the next 24-48 hours.

## 2015-01-04 NOTE — Discharge Summary (Signed)
Name: Jill Short MRN: [id number] DOB: [birth year] 81 y.o. PCP: No primary care provider on file.  Date of Admission: 01/01/2015  3:52 PM Date of Discharge: 01/09/2015 Attending Physician: No att. providers found  Discharge Diagnosis: Principal Problem:   Diverticulosis of colon with hemorrhage Active Problems:   Pacemaker   DM (diabetes mellitus)   Atrial fibrillation   Anticoagulated by anticoagulation treatment  Discharge Medications:   Medication List    STOP taking these medications        ELIQUIS 2.5 MG Tabs tablet  Generic drug:  apixaban     FISH OIL TRIPLE STRENGTH 1400 MG Caps     meloxicam 7.5 MG tablet  Commonly known as:  MOBIC     metoprolol tartrate 25 MG tablet  Commonly known as:  LOPRESSOR     ramipril 10 MG capsule  Commonly known as:  ALTACE      TAKE these medications        aspirin EC 81 MG tablet  Take 81 mg by mouth daily.     B-12 PO  Take 1 tablet by mouth daily.     bacitracin ointment  Apply 1 application topically 2 (two) times daily.     cholecalciferol 1000 UNITS tablet  Commonly known as:  VITAMIN D  Take 1,000 Units by mouth daily.     fenofibrate 50 MG tablet  Commonly known as:  TRIGLIDE  Take 50 mg by mouth daily.     ferrous sulfate 325 (65 FE) MG tablet  Take 1 tablet (325 mg total) by mouth daily with breakfast.     FLAX PO  Take 1,200 mg by mouth daily.     metFORMIN 500 MG tablet  Commonly known as:  GLUCOPHAGE  Take 500 mg by mouth 2 (two) times daily with a meal.     pantoprazole 40 MG tablet  Commonly known as:  PROTONIX  Take 1 tablet (40 mg total) by mouth daily.     polyethylene glycol packet  Commonly known as:  MIRALAX / GLYCOLAX  Take 17 g by mouth 2 (two) times daily.     vitamin C 500 MG tablet  Commonly known as:  ASCORBIC ACID  Take 500 mg by mouth daily.        Disposition and follow-up:   Jill Short was discharged from [facility] in Stable condition.   At the hospital follow up visit please address:  1.  Diverticular bleed- Is patient still having bloody bowel movements? AFib- Patient to resume Eliquis in 1 week (01/14/15) HTN- Home BP meds held in setting of GI bleed. Determine if BP meds should be restarted.   2.  Labs / imaging needed at time of follow-up: CBC  3.  Pending labs/ test needing follow-up: None   Follow-up Appointments: Follow-up Information    Follow up with [facility].   Why:  Internal Medicine Clinic will call you to schedule a hospital follow up appointment to have your hemoglobin checked this week   Contact information:   [address] [place] [phone]      Discharge Instructions: Discharge Instructions    Call MD for:  difficulty breathing, headache or visual disturbances    Complete by:  As directed      Call MD for:  extreme fatigue    Complete by:  As directed      Call MD for:  extreme fatigue    Complete by:  As directed      Call MD for:  persistant dizziness or light-headedness    Complete by:  As directed      Call MD for:  persistant dizziness or light-headedness    Complete by:  As directed      Call MD for:    Complete by:  As directed   Bloody stools.     Diet - low sodium heart healthy    Complete by:  As directed      Diet - low sodium heart healthy    Complete by:  As directed      Discharge instructions    Complete by:  As directed   Please do not take eliquis for 1 week.  Also, do not take any NSAIDS, including ibuprofen, naproxen, meloxicam.  You may use tylenol up to 4000mg  per day for pain.  We will schedule you for an appointment in our internal medicine clinic this week to check your hemoglobin level.  Please call us if you experience any bloody bowel movements at [phone] and you are feeling weak, dizzy, or lightheadedness.     Increase activity slowly    Complete by:  As directed      Increase activity slowly     Complete by:  As directed            Consultations: Treatment Team:  Laurence Spates, MD  Procedures Performed:  No results found.  Admission HPI:  Pt is an 81 y/o F w/ PMHx of afib w/ pacemaker on eliquis, HTN, DM, and HLD who presents with bright red blood per rectum. This morning at 6:30am pt had an episode of bowel incontinence in bed and noticed blood on the toilet paper when she wiped. She states her stools were brown. She then had 2 more BMs throughout the day with BRBPR. Her daughter noticed that the patient was weaker than usual and appeared pale and brought her to the ED. Pt has DOE. She denies HA, light headedness, SOB, chest pain, n/v, and abd pain. She takes meloxicam 7.5mg  BID. She had a colonoscopy in 2014 that revealed diverticulosis, she was told she did not need another colonoscopy again due to her age. In the ED she passed a blood clot and her blood pressure was low down to 85/45. Her husband states SBP normally in the 120's. She was started on 2 units of RBC transfusion while in the ED.   Hospital Course by problem list: Principal Problem:   Diverticulosis of colon with hemorrhage Active Problems:   Pacemaker   DM (diabetes mellitus)   Atrial fibrillation   Anticoagulated by anticoagulation treatment   #GI bleed The patient presented with a one-day history of bright red blood per rectum. She had been taking both meloxicam for osteoarthritis and Eliquis for her atrial fibrillation. In addition, she had a colonoscopy in 2007 that demonstrated diffuse diverticulosis and significant redundancy of her colon.  She denied any abdominal pain or history of nausea, vomiting, or melena, so her GI bleeding was likely secondary to a diverticular bleed. Her home meloxicam and Eliquis were held on presentation.  Her hemoglobin was 9.6 on presentation but decreased to 6.6, so she was transfused 2 units of red blood cells. GI was consulted and recommended continued observation. She  continued to have her intermittent bloody bowel movements over the next couple days, and she required an additional 2 units of red blood cells. Following this transfusion, her hemoglobin remained stable in the 8-9 range.  However, she continued to have blood bowel movements so GI performed a colonoscopy which showed severe diverticulosis with no active bleeding. GI recommended for her to continue Miralax indefinitely. Her home meloxicam and Eliquis were held at discharge and she was recommended to resume Eliquis in 1 week. She was switched to Tylenol for her osteoarthritis. She has a follow up appointment in the IM clinic.   #Chest pain  During the hospitalization, the patient reported left-sided chest pressure beneath one of her telemetry leads. The chest pressure was somewhat reproducible on palpation. EKG demonstrated ongoing pacing and was unrevealing. 2 troponins were checked and found to be negative, and her chest pain had resolved the following day. It is likely that her chest pain was musculoskeletal, potentially aggravated by working with physical therapy the morning prior.  #Hypertension The patient's home Lopressor 25 mg daily at bedtime and ramipril 10 mg daily were held during the hospitalization in the setting of a GI bleed. She remained normotensive off of these medications. Her home BP meds were held upon discharge and will be addressed at her follow-up appointment.   #Atrial fibrillation with pacemaker The patient has a history of atrial fibrillation, and she had recently undergone pacemaker implantation due to sinus node dysfunction. Her rates were controlled off of her home metoprolol during the hospitalization, and her home Eliquis was held in the setting of a GI bleed. She was recommended to restart her Eliquis in 1 week (01/14/15). Her CHA2DS2-VASc score is 5 (Age, female, HTN, diabetes-high risk), consistent with a 7.2% annual stroke risk.  #Type 2 diabetes The patient's blood sugars  were well-controlled on a sensitive insulin sliding scale. Her home metformin 500 mg daily was restarted on discharge.  Discharge Vitals:   BP 155/75 mmHg  Pulse 91  Temp(Src) 98.7 F (37.1 C) (Oral)  Resp 16  Ht 5\' 6"  (1.676 m)  Wt 140 lb 10.5 oz (63.8 kg)  BMI 22.71 kg/m2  SpO2 100% Physical Exam General: elderly woman sitting up in chair, NAD HEENT: /AT, EOMI, sclera anicteric, mucous membranes moist CV: RRR, no m/g/r Pulm: CTA bilaterally, breaths non-labored  Abd: BS+, soft, mild distension, non-tender Ext: warm, no edema Neuro: alert and oriented x 3  Discharge Labs:  No results found for this or any previous visit (from the past 24 hour(s)).  Signed: Juliet Rude, MD 01/09/2015, 7:22 AM    Services Ordered on Discharge: Home health PT. Equipment Ordered on Discharge: Cane.

## 2015-01-04 NOTE — Progress Notes (Signed)
Patient has had 2 loose bowel movements today, both brownish/red colored with red blood clots. PT came to walk patient and patient started dripping blood when walking. Dr. Trudee Kuster was notified and also made aware of the bowel movements earlier today. RN expressed concern about transferring patient but MD ordered to continue with transferring patient to med surg floor. RN will monitor.

## 2015-01-04 NOTE — Progress Notes (Signed)
Report given to [facility] RN. Patient wants to finish eating before transporting. Family at bedside.

## 2015-01-05 DIAGNOSIS — Z79899 Other long term (current) drug therapy: Secondary | ICD-10-CM

## 2015-01-05 LAB — CBC
HCT: 27 % — ABNORMAL LOW (ref 36.0–46.0)
HEMATOCRIT: 23.1 % — AB (ref 36.0–46.0)
HEMOGLOBIN: 8 g/dL — AB (ref 12.0–15.0)
Hemoglobin: 9.3 g/dL — ABNORMAL LOW (ref 12.0–15.0)
MCH: 31.3 pg (ref 26.0–34.0)
MCH: 31.8 pg (ref 26.0–34.0)
MCHC: 34.6 g/dL (ref 30.0–36.0)
MCHC: 34.4 g/dL (ref 30.0–36.0)
MCV: 90.2 fL (ref 78.0–100.0)
MCV: 92.5 fL (ref 78.0–100.0)
PLATELETS: 241 10*3/uL (ref 150–400)
Platelets: 197 10*3/uL (ref 150–400)
RBC: 2.56 MIL/uL — AB (ref 3.87–5.11)
RBC: 2.92 MIL/uL — ABNORMAL LOW (ref 3.87–5.11)
RDW: 16 % — ABNORMAL HIGH (ref 11.5–15.5)
RDW: 16 % — ABNORMAL HIGH (ref 11.5–15.5)
WBC: 6.2 10*3/uL (ref 4.0–10.5)
WBC: 8.8 10*3/uL (ref 4.0–10.5)

## 2015-01-05 LAB — GLUCOSE, CAPILLARY
Glucose-Capillary: 87 mg/dL (ref 65–99)
Glucose-Capillary: 82 mg/dL (ref 65–99)
Glucose-Capillary: 89 mg/dL (ref 65–99)
Glucose-Capillary: 89 mg/dL (ref 65–99)
Glucose-Capillary: 101 mg/dL — ABNORMAL HIGH (ref 65–99)

## 2015-01-05 MED ORDER — PEG 3350-KCL-NA BICARB-NACL 420 G PO SOLR: 4000.0000 mL | Freq: Once | ORAL | Status: DC

## 2015-01-05 MED ORDER — PEG 3350-KCL-NA BICARB-NACL 420 G PO SOLR
2000.0000 mL | Freq: Once | ORAL | Status: AC
  Administered 2015-01-06: 2000 mL via ORAL
  Filled 2015-01-05: qty 4000

## 2015-01-05 MED ORDER — SODIUM CHLORIDE 0.9 % IV SOLN
INTRAVENOUS | Status: DC
  Administered 2015-01-05: via INTRAVENOUS

## 2015-01-05 MED ORDER — PEG 3350-KCL-NA BICARB-NACL 420 G PO SOLR
2000.0000 mL | Freq: Once | ORAL | Status: AC
Start: 2015-01-05 — End: 2015-01-05
  Administered 2015-01-05: 2000 mL via ORAL
  Filled 2015-01-05: qty 4000

## 2015-01-05 NOTE — Progress Notes (Signed)
Subjective: Per nurse, patient did not have any bloody bowel movements overnight or this morning. However, patient reports she had some bloody bowel movements this morning. She denies any abdominal pain, chest pain, nausea, or vomiting. Hbg dropped to 8.0 from 8.9 last night, but overall stable.   Objective: Vital signs in last 24 hours: Filed Vitals:   01/04/15 1200 01/04/15 1500 01/04/15 2141 01/05/15 0448  BP: 98/68 107/48 140/62 103/68  Pulse: 74 73 66 70  Temp: 97.8 F (36.6 C) 98.4 F (36.9 C) 98.4 F (36.9 C) 98.1 F (36.7 C)  TempSrc: Oral Oral Oral Oral  Resp: 20 20 16 16   Height:      Weight:      SpO2: 100% 100% 100% 98%   Weight change:   Intake/Output Summary (Last 24 hours) at 01/05/15 0814 Last data filed at 01/04/15 2349  Gross per 24 hour  Intake    720 ml  Output   1100 ml  Net   -380 ml   Physical Exam General: elderly woman sitting up in bed, NAD HEENT: [place]/AT, EOMI, sclera anicteric, mucus membranes moist CV: RRR, 2/6 systolic murmur Pulm: CTA bilaterally, breaths non-labored Abd: BS+, soft, non-distended, non-tender Ext: warm, no edema Neuro: alert and oriented x 3, no focal deficits   Lab Results: Basic Metabolic Panel:  Recent Labs Lab 01/01/15 1609  NA 138  K 3.9  CL 108  CO2 21*  GLUCOSE 162*  BUN 28*  CREATININE 0.91  CALCIUM 9.0   Liver Function Tests:  Recent Labs Lab 01/01/15 1609  AST 31  ALT 15  ALKPHOS 26*  BILITOT 0.8  PROT 6.3*  ALBUMIN 3.2*   CBC:  Recent Labs Lab 01/04/15 1809 01/05/15 0544  WBC 8.5 6.2  HGB 8.9* 8.0*  HCT 25.9* 23.1*  MCV 89.6 90.2  PLT 208 197   Cardiac Enzymes:  Recent Labs Lab 01/03/15 1523 01/03/15 1900  TROPONINI <0.03 <0.03   CBG:  Recent Labs Lab 01/04/15 1125 01/04/15 1752 01/04/15 2003 01/04/15 2337 01/05/15 0452 01/05/15 0751  GLUCAP 91 93 85 87 87 82   Coagulation:  Recent Labs Lab 01/01/15 1609  LABPROT 17.5*  INR 1.42    Medications: I have  reviewed the patient's current medications.  Assessment/Plan:  Lower GI Bleed: Patient reported bloody bowel movements this morning. Hbg dropped down slightly to 8.0 this morning from 8.9 last night. It appears that her bleeding has slowed. Continuing with conservative management. Will see if GI will do colonoscopy since still having some bleeding.  - GI following, appreciate recommendations - Check CBCs Q12H - Transfuse if Hbg < 7.0 - Continue to hold Eliquis and Meloxicam - Will discharge on Tylenol for OA pain - Continue Protonix 40 mg daily - Continue clear liquids - Continue Miralax TID - Home health PT and DME cane ordered   Chest Pain: Patient denies any further chest pain. Chest pain thought to be musculoskeletal.  - No further work up needed unless pain reoccurs   Type 2 DM: Glucose well controlled with CBGs in 80s.  - Continue to hold home Metformin 500 mg daily - Sensitive sliding scale insulin TID with meals   HTN: Normotensive. - Hold home Lopressor 25 mg daily and Ramipril 10 mg daily in setting of GI bleed   Atrial Fibrillation with Pacemaker: Rate controlled. - Hold home Lopressor and Eliquis in setting of GI bleed   Diet: Clears  VTE PPx: SCDs Dispo: Disposition is deferred at this time,  awaiting improvement of current medical problems. Discharge likely within next 24 hours.   The patient does have a current PCP (Jill Short in [place]) and does not need an [facility] hospital follow-up appointment after discharge.  The patient does not have transportation limitations that hinder transportation to clinic appointments.  .Services Needed at time of discharge: Y = Yes, Blank = No PT:   OT:   RN:   Equipment:   Other:     LOS: 4 days   Jill Rude, MD 01/05/2015, 8:14 AM

## 2015-01-05 NOTE — Progress Notes (Signed)
Occupational Therapy Treatment Patient Details Name: Jill Short MRN: [id number] DOB: 1931/01/24 Today's Date: 01/05/2015    History of present illness Jill Short is an 81 year old woman with a history of diverticulosis, atrial fibrillation on eliquis, hypertension, hyperlipidemia, and diabetes who presents with a one-day history of bright red blood per rectum. 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 She was visiting her family in [place] when she developed bright red blood per rectum that she initially noticed when she wiped. She had several more bowel movements and it was noted by others that there was bright red blood. She does not look at her stools and has no sense of smell. She therefore cannot say if her previous stools were melanic or if they smelled horribly. She has no previous history of peptic ulcer disease or lower GI bleeds. Her last colonoscopy was within the last 2 years and was reportedly only notable for diverticulosis. She has a history of arthritis and does take as needed meloxicam. She only drinks occasionally. She denies any nausea, vomiting, or hematemesis. She is without other acute complaints at this time. Patient scheduled for a colonoscopy tomorrow 6/17    OT comments  Patient making progress towards goals, patient overall supervision > min guard assist with functional tasks and functional mobility. Continue plan of care for now, will continue to follow patient acutely.   Follow Up Recommendations  No OT follow up;Supervision - Intermittent    Equipment Recommendations  None recommended by OT    Recommendations for Other Services  None at this time   Precautions / Restrictions Precautions Precautions: Fall Restrictions Weight Bearing Restrictions: No    Mobility Bed Mobility General bed mobility comments: pt up in chair  Transfers Overall transfer level: Needs assistance Equipment used: 1 person hand held  assist Transfers: Sit to/from Stand Sit to Stand: Supervision Stand pivot transfers: Supervision General transfer comment: min guard for longer distance ambulation    Balance Overall balance assessment: Needs assistance Sitting-balance support: No upper extremity supported;Feet supported Sitting balance-Leahy Scale: Good     Standing balance support: No upper extremity supported;During functional activity Standing balance-Leahy Scale: Fair   ADL Overall ADL's : Needs assistance/impaired General ADL Comments: Patient continues to be overall close supervision, even with toileting needs. For longer distance ambulation, provided min guard assist for safety.      Cognition   Behavior During Therapy: WFL for tasks assessed/performed Overall Cognitive Status: Within Functional Limits for tasks assessed                 Pertinent Vitals/ Pain       Pain Assessment: No/denies pain         Frequency Min 2X/week     Progress Toward Goals  OT Goals(current goals can now be found in the care plan section)  Progress towards OT goals: Progressing toward goals     Plan Discharge plan remains appropriate    End of Session Equipment Utilized During Treatment: Gait belt;Rolling walker   Activity Tolerance Patient tolerated treatment well   Patient Left in chair;with call bell/phone within reach    Time: 1420-1435 OT Time Calculation (min): 15 min  Charges: OT General Charges $OT Visit: 1 Procedure OT Treatments $Self Care/Home Management : 8-22 mins  Olson Lucarelli , MS, OTR/L, CLT Pager: [phone]  01/05/2015, 2:41 PM

## 2015-01-05 NOTE — Progress Notes (Signed)
Patient ID: Jill Short, female   DOB: [birth year], 81 y.o.   MRN: [id number] Medicine attending: I examined this patient this morning together with resident physician Dr. Albin Felling and I concur with her evaluation and management plan. Patient giving variable history to different examiners. She may have had another bowel movement with blood. Hemoglobin has drifted down to 8.0 from 8.9 yesterday. Abdomen is soft and nontender. Patient states she was called by one of the gastroenterologist and that a colonoscopy is planned during this admission. Prophylactic anticoagulation for her age her fibrillation is on hold. Presumed diagnosis is a diverticular bleed. We would like to see hemoglobins stable for 24-48 hours. Final disposition per GI recommendation. In general, diverticular bleeds are self-limited and colonoscopy is deferred. Patient's daughter present. Management discussed.

## 2015-01-05 NOTE — Progress Notes (Signed)
[facility] PROGRESS NOTE Subjective patient has still been passing blood with bowel movement. No abdominal pain. Hemoglobin is slowly drifted down.  Objective: Vital signs in last 24 hours: Temp:  [98.1 F (36.7 C)-98.4 F (36.9 C)] 98.2 F (36.8 C) (06/16 1358) Pulse Rate:  [66-89] 89 (06/16 1358) Resp:  [16-20] 20 (06/16 1358) BP: (103-140)/(60-68) 103/60 mmHg (06/16 1358) SpO2:  [98 %-100 %] 100 % (06/16 1358) Last BM Date: 01/05/15  Intake/Output from previous day: 06/15 0701 - 06/16 0700 In: 720 [P.O.:720] Out: 1100 [Urine:1100] Intake/Output this shift: Total I/O In: 340 [P.O.:340] Out: -   PE: General-- watching television no acute distress - Abdomen-- soft and nontender  Lab Results:  Recent Labs  01/03/15 1900 01/04/15 0100 01/04/15 0720 01/04/15 1809 01/05/15 0544  WBC 11.3* 9.5 7.6 8.5 6.2  HGB 9.1* 8.4* 8.3* 8.9* 8.0*  HCT 26.2* 24.0* 24.1* 25.9* 23.1*  PLT 180 164 172 208 197   BMET No results for input(s): NA, K, CL, CO2, CREATININE in the last 72 hours. LFT No results for input(s): PROT, AST, ALT, ALKPHOS, BILITOT, BILIDIR, IBILI in the last 72 hours. PT/INR No results for input(s): LABPROT, INR in the last 72 hours. PANCREAS No results for input(s): LIPASE in the last 72 hours.       Studies/Results: No results found.  Medications: I have reviewed the patient's current medications.  Assessment/Plan: 1. Lower G.I. bleed. This is probably diverticular or hemorrhoids. She is now 4 days since admission is still dropping her hemoglobin. I think it would be wise to go ahead and perform colonoscopy since she clearly is not stopped bleeding completely. I have discussed this with her and she is agreeable. Will plan the procedure tomorrow at approximately 1 o'clock. We have discussed polypectomy biopsy etc.   Ruthie Berch JR,Terek Bee L 01/05/2015, 4:23 PM  Pager: [phone] If no answer or after hours call [phone]

## 2015-01-06 ENCOUNTER — Encounter (HOSPITAL_COMMUNITY): Payer: Self-pay

## 2015-01-06 ENCOUNTER — Encounter (HOSPITAL_COMMUNITY)
Admission: EM | Disposition: A | Discharge status: Home / Self Care | Payer: Self-pay | Source: Home / Self Care | Attending: Internal Medicine

## 2015-01-06 ENCOUNTER — Inpatient Hospital Stay (HOSPITAL_COMMUNITY): Payer: Medicare Other | Admitting: Anesthesiology

## 2015-01-06 HISTORY — PX: COLONOSCOPY: SHX5424

## 2015-01-06 LAB — CBC
HCT: 23.7 % — ABNORMAL LOW (ref 36.0–46.0)
HEMOGLOBIN: 8 g/dL — AB (ref 12.0–15.0)
MCH: 31.4 pg (ref 26.0–34.0)
MCHC: 33.8 g/dL (ref 30.0–36.0)
MCV: 92.9 fL (ref 78.0–100.0)
Platelets: 233 10*3/uL (ref 150–400)
RBC: 2.55 MIL/uL — ABNORMAL LOW (ref 3.87–5.11)
RDW: 16.1 % — AB (ref 11.5–15.5)
WBC: 5.6 10*3/uL (ref 4.0–10.5)

## 2015-01-06 LAB — BASIC METABOLIC PANEL
Anion gap: 5 (ref 5–15)
BUN: <5 mg/dL — ABNORMAL LOW (ref 6–20)
CALCIUM: 8.8 mg/dL — AB (ref 8.9–10.3)
CO2: 27 mmol/L (ref 22–32)
CREATININE: 0.72 mg/dL (ref 0.44–1.00)
Chloride: 109 mmol/L (ref 101–111)
GFR calc Af Amer: >60 mL/min (ref >60)
GFR calc non Af Amer: >60 mL/min (ref >60)
GLUCOSE: 106 mg/dL — AB (ref 65–99)
Potassium: 3.7 mmol/L (ref 3.5–5.1)
Sodium: 141 mmol/L (ref 135–145)

## 2015-01-06 LAB — GLUCOSE, CAPILLARY
GLUCOSE-CAPILLARY: 115 mg/dL — AB (ref 65–99)
GLUCOSE-CAPILLARY: 77 mg/dL (ref 65–99)
GLUCOSE-CAPILLARY: 92 mg/dL (ref 65–99)
Glucose-Capillary: 76 mg/dL (ref 65–99)
Glucose-Capillary: 80 mg/dL (ref 65–99)
Glucose-Capillary: 82 mg/dL (ref 65–99)
Glucose-Capillary: 92 mg/dL (ref 65–99)

## 2015-01-06 SURGERY — COLONOSCOPY
Anesthesia: Monitor Anesthesia Care

## 2015-01-06 MED ORDER — LACTATED RINGERS IV SOLN: INTRAVENOUS | Status: DC

## 2015-01-06 MED ORDER — LIDOCAINE HCL (CARDIAC) 20 MG/ML IV SOLN
INTRAVENOUS | Status: DC | PRN
  Administered 2015-01-06: 40 mg via INTRAVENOUS

## 2015-01-06 MED ORDER — PROPOFOL 10 MG/ML IV BOLUS
INTRAVENOUS | Status: DC | PRN
  Administered 2015-01-06 (×6): 20 mg via INTRAVENOUS
  Administered 2015-01-06: 10 mg via INTRAVENOUS
  Administered 2015-01-06 (×6): 20 mg via INTRAVENOUS

## 2015-01-06 MED ORDER — POLYETHYLENE GLYCOL 3350 17 G PO PACK
17.0000 g | PACK | Freq: Three times a day (TID) | ORAL | Status: DC
  Administered 2015-01-06 – 2015-01-07 (×2): 17 g via ORAL
  Filled 2015-01-06 (×2): qty 1

## 2015-01-06 MED ORDER — FENTANYL CITRATE (PF) 250 MCG/5ML IJ SOLN
INTRAMUSCULAR | Status: DC | PRN
  Administered 2015-01-06: 50 ug via INTRAVENOUS

## 2015-01-06 MED ORDER — PANTOPRAZOLE SODIUM 40 MG PO TBEC: 40.0000 mg | DELAYED_RELEASE_TABLET | Freq: Every day | ORAL | Status: DC

## 2015-01-06 NOTE — Interval H&P Note (Signed)
History and Physical Interval Note:  01/06/2015 12:45 PM  Jill Short  has presented today for surgery, with the diagnosis of gi bleed  The various methods of treatment have been discussed with the patient and family. After consideration of risks, benefits and other options for treatment, the patient has consented to  Procedure(s): COLONOSCOPY (N/A) as a surgical intervention .  The patient's history has been reviewed, patient examined, no change in status, stable for surgery.  I have reviewed the patient's chart and labs.  Questions were answered to the patient's satisfaction.     Tori Cupps JR,Ronnica Dreese L

## 2015-01-06 NOTE — Anesthesia Preprocedure Evaluation (Addendum)
Anesthesia Evaluation  Patient identified by MRN, date of birth, ID band Patient awake    Reviewed: Allergy & Precautions, NPO status , Patient's Chart, lab work & pertinent test results  Airway Mallampati: II  TM Distance: >3 FB Neck ROM: Full    Dental no notable dental hx.    Pulmonary neg pulmonary ROS,  breath sounds clear to auscultation  Pulmonary exam normal       Cardiovascular + CAD + dysrhythmias Atrial Fibrillation + pacemaker Rhythm:Irregular Rate:Normal     Neuro/Psych negative neurological ROS  negative psych ROS   GI/Hepatic negative GI ROS, Neg liver ROS,   Endo/Other  diabetes, Type 2, Oral Hypoglycemic Agents  Renal/GU negative Renal ROS     Musculoskeletal negative musculoskeletal ROS (+)   Abdominal   Peds  Hematology negative hematology ROS (+)   Anesthesia Other Findings   Reproductive/Obstetrics negative OB ROS                            Anesthesia Physical Anesthesia Plan  ASA: III  Anesthesia Plan: MAC   Post-op Pain Management:    Induction: Intravenous  Airway Management Planned:   Additional Equipment:   Intra-op Plan:   Post-operative Plan:   Informed Consent: I have reviewed the patients History and Physical, chart, labs and discussed the procedure including the risks, benefits and alternatives for the proposed anesthesia with the patient or authorized representative who has indicated his/her understanding and acceptance.   Dental advisory given  Plan Discussed with: CRNA  Anesthesia Plan Comments:         Anesthesia Quick Evaluation

## 2015-01-06 NOTE — Progress Notes (Signed)
PT Cancellation Note  Patient Details Name: Jill Short MRN: [id number] DOB: 11/20/1930   Cancelled Treatment:    Reason Eval/Treat Not Completed: Patient at procedure or test/unavailable   Duncan Dull 01/06/2015, 2:25 PM Alben Deeds, Jennette DPT  [phone]

## 2015-01-06 NOTE — Progress Notes (Signed)
Subjective: No acute events overnight. Patient denies any bloody bowel movements overnight, but reports she did not use the bathroom this morning. Her Hbg went up to 9.3 last night, but back down to 8.0 this morning. GI to do colonoscopy today.   Objective: Vital signs in last 24 hours: Filed Vitals:   01/05/15 0448 01/05/15 1358 01/05/15 2100 01/06/15 0354  BP: 103/68 103/60 142/64 100/66  Pulse: 70 89 78 69  Temp: 98.1 F (36.7 C) 98.2 F (36.8 C) 98.6 F (37 C) 98.2 F (36.8 C)  TempSrc: Oral Oral Oral Oral  Resp: 16 20 17 18   Height:      Weight:      SpO2: 98% 100% 100% 100%   Weight change:   Intake/Output Summary (Last 24 hours) at 01/06/15 1134 Last data filed at 01/06/15 0946  Gross per 24 hour  Intake   2486 ml  Output   1551 ml  Net    935 ml   Physical Exam General: elderly woman sitting up in chair, NAD HEENT: [place]/AT, EOMI, sclera anicteric, mucus membranes moist CV: RRR, no m/g/r Pulm: CTA bilaterally, breaths non-labored  Abd: BS+, soft, mild distension, non-tender Ext: warm, no edema Neuro: alert and oriented x 3  Lab Results: Basic Metabolic Panel:  Recent Labs Lab 01/01/15 1609 01/06/15 0851  NA 138 141  K 3.9 3.7  CL 108 109  CO2 21* 27  GLUCOSE 162* 106*  BUN 28* <5*  CREATININE 0.91 0.72  CALCIUM 9.0 8.8*   Liver Function Tests:  Recent Labs Lab 01/01/15 1609  AST 31  ALT 15  ALKPHOS 26*  BILITOT 0.8  PROT 6.3*  ALBUMIN 3.2*   CBC:  Recent Labs Lab 01/05/15 1751 01/06/15 0610  WBC 8.8 5.6  HGB 9.3* 8.0*  HCT 27.0* 23.7*  MCV 92.5 92.9  PLT 241 233   Cardiac Enzymes:  Recent Labs Lab 01/03/15 1523 01/03/15 1900  TROPONINI <0.03 <0.03   CBG:  Recent Labs Lab 01/05/15 1204 01/05/15 1633 01/05/15 1934 01/06/15 0026 01/06/15 0350 01/06/15 0809  GLUCAP 89 89 101* 82 77 76   Coagulation:  Recent Labs Lab 01/01/15 1609  LABPROT 17.5*  INR 1.42   Medications: I have reviewed the patient's current  medications. Scheduled Meds: . sodium chloride   Intravenous Once  . insulin aspart  0-9 Units Subcutaneous TID WC  . pantoprazole  40 mg Oral Daily   Continuous Infusions: . sodium chloride 20 mL/hr at 01/05/15 2342   PRN Meds:.acetaminophen Assessment/Plan:  Lower GI Bleed: Most likely secondary to diverticular bleed. No bloody bowel movements overnight, but Hbg back down to 8.0 after going up to 9.3 last night. GI to do colonoscopy today. Would like to see Hbg stable for 24-48 hours before able to discharge home.  - GI following, appreciate recommendations - f/u colonoscopy results  - Check CBCs Q12H - Transfuse if Hbg < 7.0 - Continue to hold Eliquis and Meloxicam - Will discharge on Tylenol for OA pain - Continue Protonix 40 mg daily - NPO for now, clears after procedure  - Continue Miralax TID - Home health PT and DME cane ordered   Type 2 DM: Glucose well controlled with CBGs in 70s-80s.  - Continue to hold home Metformin 500 mg daily - Sensitive sliding scale insulin TID with meals   HTN: Normotensive. - Hold home Lopressor 25 mg daily and Ramipril 10 mg daily in setting of GI bleed   Atrial Fibrillation with Pacemaker: Rate  controlled. - Hold home Lopressor and Eliquis in setting of GI bleed   Diet: NPO for colonoscopy, clears after VTE PPx: SCDs Dispo: Disposition is deferred at this time, awaiting improvement of current medical problems.  Anticipated discharge in approximately 1-2 day(s).   The patient does have a current PCP (No primary care provider on file.) and does need an [facility] hospital follow-up appointment after discharge.  The patient does not have transportation limitations that hinder transportation to clinic appointments.  .Services Needed at time of discharge: Y = Yes, Blank = No PT:   OT:   RN:   Equipment:   Other:     LOS: 5 days   Juliet Rude, MD 01/06/2015, 11:34 AM

## 2015-01-06 NOTE — Transfer of Care (Signed)
Immediate Anesthesia Transfer of Care Note  Patient: Jill Short  Procedure(s) Performed: Procedure(s): COLONOSCOPY (N/A)  Patient Location: PACU  Anesthesia Type:MAC  Level of Consciousness: awake, alert  and oriented  Airway & Oxygen Therapy: Patient Spontanous Breathing and Patient connected to nasal cannula oxygen  Post-op Assessment: Report given to RN and Post -op Vital signs reviewed and stable  Post vital signs: Reviewed and stable  Last Vitals:  Filed Vitals:   01/06/15 1345  BP:   Pulse: 70  Temp:   Resp: 19    Complications: No apparent anesthesia complications

## 2015-01-06 NOTE — Op Note (Signed)
[facility] [place], [id number]   COLONOSCOPY PROCEDURE REPORT  PATIENT: Jill Short, Jill Short  MR#: [id number] BIRTHDATE: 1930-10-04 , 32  yrs. old GENDER: female ENDOSCOPIST: Laurence Spates, MD REFERRED BY: Internal Medicine Teaching Service PROCEDURE DATE:  January 17, 2015 PROCEDURE:   Colonoscopy, diagnostic ASA CLASS:   Class III INDICATIONS:G.I.  bleeding and woman who had been anticoagulated and has a history of diverticulosis.  She has continued to slowly bleed despite conservative therapy. MEDICATIONS: Propofol 250 mg IV  DESCRIPTION OF PROCEDURE:   After the risks and benefits and of the procedure were explained, informed consent was obtained.  revealed no abnormalities of the rectum.    The Pentax Ped Colon [id number] endoscope was introduced through the anus and advanced to the cecum, which was identified by both the appendix and ileocecal valve .  The quality of the prep was good. .  The instrument was then slowly withdrawn as the colon was fully examined. Estimated blood loss is zero unless otherwise noted in this procedure report.The colonwas markedly redundant and tortuous requiring multiple position changes and abdominal pressure. Can diverticulosis particularly severe in the left colon.     COLON FINDINGS: The colon was redundant.  Manual abdominal counter-pressure was used to reach the cecum.  The patient was moved on to their back to reach the cecum.   There was severe diverticulosis noted throughout the entire examined colon with associated tortuosity.   no signs whatsoever of recent or active bleeding  Retroflexed views revealed no abnormalities.     The scope was then withdrawn from the patient and the procedure completed.  WITHDRAWAL TIME: 7 minutes 0 seconds  COMPLICATIONS: There were no immediate complications. ENDOSCOPIC IMPRESSION: 1.   The colon was redundant 2.   There was severe diverticulosis noted throughout  the entire examined colon 3.    No signs of active bleeding throughout the entire colon. RECOMMENDATIONS: Will go ahead and resume diet and would keep on Miralax indefinitely.  Would discharge home when stable.  Hopefully can restart anticoagulation  REPEAT EXAM:  cc:  _______________________________ eSignedLaurence Spates, MD 01/17/2015 1:45 PM   CPT CODES: ICD CODES:  The ICD and CPT codes recommended by this software are interpretations from the data that the clinical staff has captured with the software.  The verification of the translation of this report to the ICD and CPT codes and modifiers is the sole responsibility of the health care institution and practicing physician where this report was generated.  [employer]. will not be held responsible for the validity of the ICD and CPT codes included on this report.  AMA assumes no liability for data contained or not contained herein. CPT is a Designer, television/film set of the [employer].   PATIENT NAME:  Beaulah, Romanek MR#: [id number]

## 2015-01-06 NOTE — H&P (View-Only) (Signed)
[facility] CON[facility] Reason for consult: Rectal Bleeding Referring Physician: Internal Medicine Teaching Service. Primary G.I.: in [place].  Jill Short is an 81 y.o. female.  HPI: she is down here in [place] visiting her daughter. She has lived in [place] as well as [place]. She had regular colonoscopies every few years last being 2 years ago at age 89. She was told she had diverticulosis. She is not aware of any polyps. She was told that after age 40 she did not need any further colonoscopies. She was recently diagnosed with atrial fibrillation had a pacemaker placed and was started on Eliquis  2 months ago. She was admitted yesterday after painless hematochezia and has had bright red blood in the toilet during the night none so far this morning. She did get to units of blood and her Eliquis has been held she has also been on some nonsteroidal's for arthritic pain which he takes intermittently. She is not having any abdominal pain whatsoever. She's had no preceding melanoma etc. To the best of her knowledge and her daughter's knowledge she is not had colon polyps and no one in the family has had colonic malignancy.  Past Medical History  Diagnosis Date  . Coronary artery disease   . Pacemaker     History reviewed. No pertinent past surgical history.  History reviewed. No pertinent family history.  Social History:  reports that she has never smoked. She does not have any smokeless tobacco history on file. She reports that she does not drink alcohol. Her drug history is not on file.  Allergies: No Known Allergies  Medications; Prior to Admission medications   Medication Sig Start Date End Date Taking? Authorizing Provider  apixaban (ELIQUIS) 2.5 MG TABS tablet Take 2.5 mg by mouth 2 (two) times daily.   Yes Historical Provider, MD  aspirin EC 81 MG tablet Take 81 mg by mouth daily.   Yes Historical Provider, MD  bacitracin ointment Apply 1 application  topically 2 (two) times daily.   Yes Historical Provider, MD  cholecalciferol (VITAMIN D) 1000 UNITS tablet Take 1,000 Units by mouth daily.   Yes Historical Provider, MD  Cyanocobalamin (B-12 PO) Take 1 tablet by mouth daily.   Yes Historical Provider, MD  fenofibrate (TRIGLIDE) 50 MG tablet Take 50 mg by mouth daily.   Yes Historical Provider, MD  Flaxseed, Linseed, (FLAX PO) Take 1,200 mg by mouth daily.   Yes Historical Provider, MD  meloxicam (MOBIC) 7.5 MG tablet Take 7.5 mg by mouth 2 (two) times daily.   Yes Historical Provider, MD  metFORMIN (GLUCOPHAGE) 500 MG tablet Take 500 mg by mouth 2 (two) times daily with a meal.   Yes Historical Provider, MD  metoprolol tartrate (LOPRESSOR) 25 MG tablet Take 25 mg by mouth at bedtime.    Yes Historical Provider, MD  Omega-3 Fatty Acids (FISH OIL TRIPLE STRENGTH) 1400 MG CAPS Take 1,400 mg by mouth 2 (two) times daily.   Yes Historical Provider, MD  polyethylene glycol (MIRALAX / GLYCOLAX) packet Take 17 g by mouth at bedtime.   Yes Historical Provider, MD  ramipril (ALTACE) 10 MG capsule Take 10 mg by mouth daily.   Yes Historical Provider, MD  vitamin C (ASCORBIC ACID) 500 MG tablet Take 500 mg by mouth daily.   Yes Historical Provider, MD   . sodium chloride   Intravenous Once  . insulin aspart  0-9 Units Subcutaneous 6 times per day   PRN Meds  Results for  orders placed or performed during the hospital encounter of 01/01/15 (from the past 48 hour(s))  Protime-INR (if patient is taking Coumadin)     Status: Abnormal   Collection Time: 01/01/15  4:09 PM  Result Value Ref Range   Prothrombin Time 17.5 (H) 11.6 - 15.2 seconds   INR 1.42 0.00 - 1.49  CBC     Status: Abnormal   Collection Time: 01/01/15  4:09 PM  Result Value Ref Range   WBC 10.6 (H) 4.0 - 10.5 K/uL   RBC 3.09 (L) 3.87 - 5.11 MIL/uL   Hemoglobin 9.6 (L) 12.0 - 15.0 g/dL   HCT 29.1 (L) 36.0 - 46.0 %   MCV 94.2 78.0 - 100.0 fL   MCH 31.1 26.0 - 34.0 pg   MCHC 33.0 30.0 -  36.0 g/dL   RDW 15.2 11.5 - 15.5 %   Platelets 318 150 - 400 K/uL  Comprehensive metabolic panel     Status: Abnormal   Collection Time: 01/01/15  4:09 PM  Result Value Ref Range   Sodium 138 135 - 145 mmol/L   Potassium 3.9 3.5 - 5.1 mmol/L   Chloride 108 101 - 111 mmol/L   CO2 21 (L) 22 - 32 mmol/L   Glucose, Bld 162 (H) 65 - 99 mg/dL   BUN 28 (H) 6 - 20 mg/dL   Creatinine, Ser 0.91 0.44 - 1.00 mg/dL   Calcium 9.0 8.9 - 10.3 mg/dL   Total Protein 6.3 (L) 6.5 - 8.1 g/dL   Albumin 3.2 (L) 3.5 - 5.0 g/dL   AST 31 15 - 41 U/L   ALT 15 14 - 54 U/L   Alkaline Phosphatase 26 (L) 38 - 126 U/L   Total Bilirubin 0.8 0.3 - 1.2 mg/dL   GFR calc non Af Amer 57 (L) >60 mL/min   GFR calc Af Amer >60 >60 mL/min    Comment: (NOTE) The eGFR has been calculated using the CKD EPI equation. This calculation has not been validated in all clinical situations. eGFR's persistently <60 mL/min signify possible Chronic Kidney Disease.    Anion gap 9 5 - 15  Type and screen     Status: None (Preliminary result)   Collection Time: 01/01/15  4:10 PM  Result Value Ref Range   ABO/RH(D) A NEG    Antibody Screen NEG    Sample Expiration 01/04/2015    Unit Number [id number]    Blood Component Type RED CELLS,LR    Unit division 00    Status of Unit ALLOCATED    Transfusion Status OK TO TRANSFUSE    Crossmatch Result Compatible    Unit Number [id number]    Blood Component Type RED CELLS,LR    Unit division 00    Status of Unit ISSUED    Transfusion Status OK TO TRANSFUSE    Crossmatch Result Compatible    Unit Number [id number]    Blood Component Type RBC LR PHER1    Unit division 00    Status of Unit ISSUED    Transfusion Status OK TO TRANSFUSE    Crossmatch Result Compatible   ABO/Rh     Status: None   Collection Time: 01/01/15  4:10 PM  Result Value Ref Range   ABO/RH(D) A NEG   POC occult blood, ED     Status: Abnormal   Collection Time: 01/01/15  5:24 PM  Result Value Ref Range    Fecal Occult Bld POSITIVE (A) NEGATIVE  CBC     Status: Abnormal  Collection Time: 01/01/15 11:16 PM  Result Value Ref Range   WBC 7.5 4.0 - 10.5 K/uL   RBC 2.09 (L) 3.87 - 5.11 MIL/uL   Hemoglobin 6.6 (LL) 12.0 - 15.0 g/dL    Comment: REPEATED TO VERIFY CRITICAL RESULT CALLED TO, READ BACK BY AND VERIFIED WITH: L.BISHOP,RN 2359 01/01/15 M.CAMPBELL    HCT 19.5 (L) 36.0 - 46.0 %   MCV 93.3 78.0 - 100.0 fL   MCH 31.6 26.0 - 34.0 pg   MCHC 33.8 30.0 - 36.0 g/dL   RDW 15.1 11.5 - 15.5 %   Platelets 230 150 - 400 K/uL  Prepare RBC     Status: None   Collection Time: 01/01/15 11:27 PM  Result Value Ref Range   Order Confirmation ORDER PROCESSED BY BLOOD BANK   MRSA PCR Screening     Status: None   Collection Time: 01/02/15 12:33 AM  Result Value Ref Range   MRSA by PCR NEGATIVE NEGATIVE    Comment:        The GeneXpert MRSA Assay (FDA approved for NASAL specimens only), is one component of a comprehensive MRSA colonization surveillance program. It is not intended to diagnose MRSA infection nor to guide or monitor treatment for MRSA infections.   CBC     Status: Abnormal   Collection Time: 01/02/15 12:56 AM  Result Value Ref Range   WBC 9.5 4.0 - 10.5 K/uL   RBC 2.04 (L) 3.87 - 5.11 MIL/uL   Hemoglobin 6.5 (LL) 12.0 - 15.0 g/dL    Comment: REPEATED TO VERIFY CRITICAL VALUE NOTED.  VALUE IS CONSISTENT WITH PREVIOUSLY REPORTED AND CALLED VALUE.    HCT 19.3 (L) 36.0 - 46.0 %   MCV 94.6 78.0 - 100.0 fL   MCH 31.9 26.0 - 34.0 pg   MCHC 33.7 30.0 - 36.0 g/dL   RDW 15.0 11.5 - 15.5 %   Platelets 229 150 - 400 K/uL  Glucose, capillary     Status: Abnormal   Collection Time: 01/02/15  1:50 AM  Result Value Ref Range   Glucose-Capillary 143 (H) 65 - 99 mg/dL  Prepare RBC     Status: None   Collection Time: 01/02/15  2:00 AM  Result Value Ref Range   Order Confirmation BB SAMPLE OR UNITS ALREADY AVAILABLE   Glucose, capillary     Status: Abnormal   Collection Time:  01/02/15  4:49 AM  Result Value Ref Range   Glucose-Capillary 118 (H) 65 - 99 mg/dL  APTT     Status: None   Collection Time: 01/02/15  7:21 AM  Result Value Ref Range   aPTT 25 24 - 37 seconds  CBC     Status: Abnormal   Collection Time: 01/02/15  7:21 AM  Result Value Ref Range   WBC 13.9 (H) 4.0 - 10.5 K/uL   RBC 2.77 (L) 3.87 - 5.11 MIL/uL   Hemoglobin 8.6 (L) 12.0 - 15.0 g/dL    Comment: REPEATED TO VERIFY POST TRANSFUSION SPECIMEN    HCT 24.3 (L) 36.0 - 46.0 %   MCV 87.7 78.0 - 100.0 fL    Comment: REPEATED TO VERIFY POST TRANSFUSION SPECIMEN    MCH 31.0 26.0 - 34.0 pg   MCHC 35.4 30.0 - 36.0 g/dL   RDW 16.9 (H) 11.5 - 15.5 %   Platelets 175 150 - 400 K/uL  Glucose, capillary     Status: Abnormal   Collection Time: 01/02/15  8:19 AM  Result Value Ref Range  Glucose-Capillary 120 (H) 65 - 99 mg/dL    No results found.            Blood pressure 115/56, pulse 91, temperature 97.4 F (36.3 C), temperature source Oral, resp. rate 18, height _0  (1.702 m), weight 61.5 kg (135 lb 9.3 oz), SpO2 100 %.  Physical exam:   General-- alert reasonably oriented white female in no distress ENT-- nonenteric Neck-- the lymphadenopathy Heart-- regular rate and rhythm without murmurs or gallops Lungs-- clear Abdomen-- soft completely nontender Psych-- alert and oriented and completely appropriate answers questions appropriately   Assessment: 1. Acute hematochezia. This is in the face of a woman who is recently been anticoagulated due to atrial fibrillation. Previous colonoscopies have shown diverticulosis. I suspect that she has a diverticular bleeding exacerbated by anticoagulation. 2. Atrial fibrillation. New onset and has had recent pacemaker and anticoagulation  Plan: 1. Will go ahead and start the patient on clear liquids and Miralax. If her bleeding stops she may not need any further work. If not, we may need to consider another colonoscopy.   Gilford Lardizabal  JR,Elnore Cosens L 01/02/2015, 9:41 AM   Pager: [phone] If no answer or after hours call [phone]

## 2015-01-06 NOTE — Progress Notes (Signed)
Colonoscopy showed severe diverticulosis with no signs of active bleeding. Would resume diet and Miralax. We will sign off and be happy to see again as needed. Would consider restarting anticoagulation when hemoglobin stable

## 2015-01-06 NOTE — Care Management Note (Signed)
Case Management Note  Patient Details  Name: Jill Short MRN: [id number] Date of Birth: 26-Jun-1931  Subjective/Objective:    Pt adm on 01/01/15 with GI bleed.  PTA, pt resides at home with spouse.  PT recommending [place] follow up.  MD has ordered [facility] services and cane.                 Action/Plan: Met with pt to discuss home arrangements.  Pt politely declines HH follow up at this time.  She states her husband has a cane at home that she may use.  She denies any needs for home at this time.   Expected Discharge Date:   01/07/15.                 Expected Discharge Plan:  [place]  In-House Referral:     Discharge planning Services  CM Consult  Post Acute Care Choice:    Choice offered to:     DME Arranged:    DME Agency:     HH Arranged:  Patient Refused [place] Agency:     Status of Service:  In process, will continue to follow  Medicare Important Message Given:  No Date Medicare IM Given:    Medicare IM give by:    Date Additional Medicare IM Given:    Additional Medicare Important Message give by:     If discussed at [place] of Stay Meetings, dates discussed:    Additional Comments:  Reinaldo Raddle, RN, BSN  Trauma/Neuro ICU Case Manager [phone]

## 2015-01-07 DIAGNOSIS — K5731 Diverticulosis of large intestine without perforation or abscess with bleeding: Principal | ICD-10-CM

## 2015-01-07 LAB — CBC
HCT: 23.9 % — ABNORMAL LOW (ref 36.0–46.0)
Hemoglobin: 7.9 g/dL — ABNORMAL LOW (ref 12.0–15.0)
MCH: 30.9 pg (ref 26.0–34.0)
MCHC: 33.1 g/dL (ref 30.0–36.0)
MCV: 93.4 fL (ref 78.0–100.0)
Platelets: 280 10*3/uL (ref 150–400)
RBC: 2.56 MIL/uL — ABNORMAL LOW (ref 3.87–5.11)
RDW: 16.3 % — AB (ref 11.5–15.5)
WBC: 6.8 10*3/uL (ref 4.0–10.5)

## 2015-01-07 LAB — GLUCOSE, CAPILLARY
GLUCOSE-CAPILLARY: 96 mg/dL (ref 65–99)
Glucose-Capillary: 84 mg/dL (ref 65–99)
Glucose-Capillary: 94 mg/dL (ref 65–99)
Glucose-Capillary: 116 mg/dL — ABNORMAL HIGH (ref 65–99)

## 2015-01-07 MED ORDER — FERROUS SULFATE 325 (65 FE) MG PO TABS: 325.0000 mg | ORAL_TABLET | Freq: Every day | ORAL | Status: DC

## 2015-01-07 MED ORDER — POLYETHYLENE GLYCOL 3350 17 G PO PACK: 17.0000 g | PACK | Freq: Two times a day (BID) | ORAL | Status: DC

## 2015-01-07 NOTE — Progress Notes (Signed)
Called pts dtr regarding DC.  She is presently having to bring in her father into ED.

## 2015-01-07 NOTE — Discharge Instructions (Signed)
Please keep your follow-up appointments; this is very important for your continued recovery.    We have made the following additions/changes to your medications:  Please refer to your medication list.    Do not take meloxicam anymore as this may increase your risk of bleeding. You may take up to 4000mg  of tylenol per day for pain. Please do not resume taking your eliquis now.  You may resume this in about 1 week. Our clinic will call you to schedule an appointment this week to have your hemoglobin checked and your eliquis restarted.     Please continue to take all of your medications as prescribed.  Do not miss any doses without contacting your primary physician.  If you have questions, please contact your physician or contact the Internal Medicine Teaching Service at [phone].  Please bring your medicications with you to your appointments; medications may be eye drops, herbals, vitamins, or pills.    If you believe you are suffering from a life-threatening emergency, go to the nearest Emergency Department.      [place] Guide  1) Find a Charity fundraiser Although you won't have to find out who is covered by [employer] plan, it is a good idea to ask around and get recommendations. You will then need to call the office and see if the doctor you have chosen will accept you as a new patient and what types of options they offer for patients who are self-pay. Some doctors offer discounts or will set up payment plans for their patients who do not have insurance, but you will need to ask so you aren't surprised when you get to your appointment.  2) Contact Your Local Health Department Not all health departments have doctors that can see patients for sick visits, but many do, so it is worth a call to see if yours does. If you don't know where your local health department is, you can check in your phone book. The CDC also has a tool to help you locate your state's health  department, and many state websites also have listings of all of their local health departments.  3) Find a [facility] If your illness is not likely to be very severe or complicated, you may want to try a walk in clinic. These are popping up all over the country in pharmacies, drugstores, and shopping centers. They're usually staffed by nurse practitioners or physician assistants that have been trained to treat common illnesses and complaints. They're usually fairly quick and inexpensive. However, if you have serious medical issues or chronic medical problems, these are probably not your best option.  No Primary Care Doctor: - Call Health Connect at  [phone] - they can help you locate a primary care doctor that  accepts your insurance, provides certain services, etc. - Physician Referral Service- [phone]  Chronic Pain Problems: Organization         Address  Phone   Notes  [facility]  [phone] Patients need to be referred by their primary care doctor.   Medication Assistance: Organization         Address  Phone   Notes  [facility] Medication [facility] [address] [place], [place] [address] [phone] --Must be a resident of [facility] -- Must have NO insurance coverage whatsoever (no Medicaid/ Medicare, etc.) -- The pt. MUST have a primary care doctor that directs their care regularly and follows them in  the community   MedAssist  [phone]   [place]  [phone]    Agencies that provide inexpensive medical care: Organization         Address  Phone   Notes  [place]  [phone]   [facility]    [phone]   [facility] [place], [place] [address] [phone]   [place] [address] [place] [phone]   Planned Parenthood    [phone]   [facility]    [phone]    [place] and [place], [place] Phone:  [phone], Fax:  [phone] Hours of Operation:  9 am - 6 pm, M-F.  Also accepts Medicaid/Medicare and self-pay.  [facility] for [place], [address], [place] Phone: [phone], Fax: [phone]. Hours of Operation:  8:30 am - 5:30 pm, M-F.  Also accepts Medicaid and self-pay.  [facility] [place] [address] [place] Phone: [phone]   [place], [place], [place] [phone], Ext. [phone] Mondays & Thursdays: 7-9 AM.  First 15 patients are seen on a first come, first serve basis.    [place] Providers:  Organization         Address  Phone   Notes  [facility] [address], [place] [phone] Also accepts self-pay patients.  [facility] [address], [place]  [phone]   [place], [address], [place] [phone]   [facility] [address] [place] [phone]   Lucianne Lei [address], [place]   [phone] Only accepts [place] Access [place] patients after they have their name applied to their card.   Self-Pay (no insurance) in [facility]:  Organization         Address  Phone   Notes  Sickle Cell Patients, [facility] [place] [phone]   [facility] Urgent Care [place] [phone]   [facility] [place]  [place], [place], [place] [phone]   [facility]/Dr. Osei-Bonsu  [address], [place] or [address], [place] [phone] Phone number for both [place] and [place] locations is the same.  Urgent Medical and [facility] [address] [place] [phone]    [facility] [address] [place] or [address] [phone]   [facility] [address] [place] [phone], phone; [phone], fax Sees patients 1st and 3rd Saturday of every month.  Must not qualify for public or private insurance (i.e. Medicaid, Medicare, [place] Health Choice, Veterans' Benefits)  Household income should be no more than 200% of the poverty level The clinic cannot treat you if you are pregnant or think you are pregnant  Sexually transmitted diseases are not treated at the clinic.    Dental Care: Organization         Address  Phone  Notes  [facility] Department of [facility] [address] [place], [place] [phone] Accepts children up to age 81 who are enrolled in [place]  or [place] Health Choice; pregnant women with a Medicaid card; and children who have applied for Medicaid or [place] Health Choice, but were declined, whose parents can pay a reduced fee at time of service.  [facility]  [address], [place] [phone] Accepts children up to age 34 who are enrolled in [place] or [place]; pregnant women with a Medicaid card; and children who have applied for Medicaid or [place] Health Choice, but were declined, whose parents can pay a reduced fee at time of service.  [place] Adult Dental Access PROGRAM  [place] [phone] Patients are seen by appointment only. Walk-ins are not accepted. [place] will see patients 41 years of age and older. Monday - Tuesday (8am-5pm) Most Wednesdays (8:30-5pm) $30 per visit, cash only  [facility] Adult Dental Access PROGRAM  [address], [facility] [phone] Patients are seen by appointment only. Walk-ins are not accepted. [place] will see patients 70 years of age and older. One Wednesday Evening (Monthly: Volunteer Based).   $30 per visit, cash only  [place]  [phone] for adults; Children under age 28, call Graduate Pediatric Dentistry at [phone]. Children aged 60-14, please call [phone] to request a pediatric application.  Dental services are provided in all areas of dental care including fillings, crowns and bridges, complete and partial dentures, implants, gum treatment, root canals, and extractions. Preventive care is also provided. Treatment is provided to both adults and children. Patients are selected via a lottery and there is often a waiting list.   [facility] [address] [place]  [phone] [email or web address]   [facility] [address] [place], [place] [phone], Ext. [phone] Second and Fourth Thursday of each month, opens at 6:30 AM; Clinic ends at 9 AM.  Patients are seen on a first-come first-served basis, and a limited number are seen during each clinic.   [facility]  [address] [place] Danker [place], [place] [phone]   Eligibility Requirements You must have lived in [place], [place], or [place] counties for at least the last three months.   You cannot be eligible for state or federal sponsored [employer], including [employer], [place], or [employer].   You generally cannot be eligible for healthcare insurance through your employer.    How to apply: Eligibility screenings are held every Tuesday and Wednesday afternoon from 1:00 pm until 4:00 pm. You do not need an appointment for the interview!  [facility] [address], [place], [place]   [place]  [place] Department  [place]  [phone]    Behavioral Health Resources in the Community: Intensive Outpatient Programs Organization         Address  Phone  Notes  [place]. [address], [place], [place] [phone]   [facility] Outpatient [address] [place], [place]   ADS: Alcohol & Drug Svcs [address], [place], [place]   [place] [address],  [place], [place] or [phone]   Substance Abuse Resources Organization         Address  Phone  Notes  Alcohol and Drug Services  [place]  [phone]   The [place]  House  [phone]   [place]inita Pester  [phone]   Residential & Outpatient Substance Abuse Program  [phone]   Psychological Services Organization         Address  Phone  Notes  [place]  [place]  [phone]   [place] [address] [place] or [phone]    Mobile Crisis Teams Organization         Address  Phone  Notes  Therapeutic Alternatives, Mobile Crisis Care Unit  [phone]   Assertive Psychotherapeutic Services  [address] [place], [place]   [place] Levels [address] [place] [phone]    Self-Help/Support Groups Organization         Address  Phone             Notes  [place]. of [place] - variety of support groups  [place] Call for more information  Narcotics Anonymous (NA), Caring Services [address], [employer] [place]  2 meetings at this location   Special educational needs teacher         Address  Phone  Notes  ASAP Residential Treatment [place],    [place]  [phone]   [facility]  [address] [place] [address], [place], [place]   [place], [place] [phone] Admissions: 8am-3pm M-F  Incentives Substance [place] [address]    [place], [place] [phone]   [facility] [address] [place], [place], [place]   [facility] [address],  [place], [place]   Insight Programs - Intensive Outpatient [address] [place] [place] [address], [place], [place]   [facility] ([place] [address].,  [place], [place] [phone] or [phone]   Residential Treatment Services (RTS) [address], [place], [place] Accepts Medicaid  [facility] [place] [address]  [place] [phone] Substance Abuse/Addiction Treatment   [facility] Organization         Address  Phone  Notes  [facility]  [phone]   Domenic Schwab, PhD [address] [place]lis [place], [place]   [phone] or [phone]   [place], [place] [phone]   [facility] [phone], [place], [place] [phone] Insurance/Medicaid/sponsorship through [facility] and Families [address] [place]                                    [place], [place] [phone] [place] [address] [place] [address], [place] [phone]    Dr. Adele Schilder  [phone]   [facility] Dept. 1) [address], [place] 2) [place] 3)  [place] [address], [place] [phone]  [phone]   [place] [phone] or [phone] (After Hours)

## 2015-01-07 NOTE — Progress Notes (Signed)
Pt DC'd from the unit, wheeled down to the ED to be with her husband who was being admitted to [facility] from the ED.  All DC instructions reviewed and copy given to pt.  Pt informed to stop Eliquis for 1 week.  She understands her follow up appts.

## 2015-01-07 NOTE — Progress Notes (Signed)
Subjective:  VSS.  Hgb stable.  She feels good today.  Has not had any BRBPR since Thursday which was very mild.  Denies SOB, CP.  Eating well and is ready to go home today with her daughter.    Objective: Vital signs in last 24 hours: Filed Vitals:   01/06/15 1340 01/06/15 1345 01/06/15 2030 01/07/15 0655  BP: 96/51  133/63 119/59  Pulse:  70 83 70  Temp:   98.6 F (37 C) 97.4 F (36.3 C)  TempSrc:   Oral Oral  Resp:  19 18 16   Height:      Weight:      SpO2:  99% 100% 98%   Weight change:   Intake/Output Summary (Last 24 hours) at 01/07/15 1209 Last data filed at 01/07/15 1028  Gross per 24 hour  Intake   1040 ml  Output    450 ml  Net    590 ml   Physical Exam General: elderly woman sitting up in chair, NAD HEENT: [place]/AT, EOMI, sclera anicteric, mucous membranes moist CV: RRR, no m/g/r Pulm: CTA bilaterally, breaths non-labored  Abd: BS+, soft, mild distension, non-tender Ext: warm, no edema Neuro: alert and oriented x 3  Lab Results: Basic Metabolic Panel:  Recent Labs Lab 01/01/15 1609 01/06/15 0851  NA 138 141  K 3.9 3.7  CL 108 109  CO2 21* 27  GLUCOSE 162* 106*  BUN 28* <5*  CREATININE 0.91 0.72  CALCIUM 9.0 8.8*   Liver Function Tests:  Recent Labs Lab 01/01/15 1609  AST 31  ALT 15  ALKPHOS 26*  BILITOT 0.8  PROT 6.3*  ALBUMIN 3.2*   CBC:  Recent Labs Lab 01/06/15 0610 01/07/15 0400  WBC 5.6 6.8  HGB 8.0* 7.9*  HCT 23.7* 23.9*  MCV 92.9 93.4  PLT 233 280   Cardiac Enzymes:  Recent Labs Lab 01/03/15 1523 01/03/15 1900  TROPONINI <0.03 <0.03   CBG:  Recent Labs Lab 01/06/15 1154 01/06/15 1612 01/06/15 2025 01/06/15 2350 01/07/15 0344 01/07/15 0755  GLUCAP 80 92 115* 92 84 96   Coagulation:  Recent Labs Lab 01/01/15 1609  LABPROT 17.5*  INR 1.42   Medications: I have reviewed the patient's current medications. Scheduled Meds: . insulin aspart  0-9 Units Subcutaneous TID WC  . pantoprazole  40 mg Oral  Daily  . polyethylene glycol  17 g Oral TID   Continuous Infusions:   PRN Meds:.acetaminophen Assessment/Plan:  Diverticular bleed:  Colonoscopy yesterday showed redundant colon with severe diverticulosis noted throughout entire colon examined.  No signs of active bleeding.  GI recommends resuming diet and keep Miralax daily indefinitely.  Hgb stable this AM at 7.9.  Has-bled score 2, which states anticoagulation can be considered.  CHA2DS2-VASc score 5, so should be on anticoagulation.  Spoke with Dr. Beryle Beams and recommended restarting eliquis in about 1 week.  Will f/w in our clinic for a 1 time appointment.   -would d/c meloxicam  -cont tylenol PRN pain  -continue protonix 40 mg daily -cont miralax bid   Type 2 DM: -hold home metformin 500 mg qd -SSI   HTN:  Normotensive. -hold home Lopressor 25 mg daily and Ramipril 10 mg daily in setting of GI bleed   Atrial Fibrillation with Pacemaker: Rate controlled. -hold home lopressor  -cont to hold eliquis per Dr. Synthia Innocent recommendations   Diet: HH/Carb mod VTE PPx: SCDs Dispo: Anticipated discharge today given stability of hgb and no further bleeding.  Pt is refusing [place]  services.  We will have her f/u in our clinic for a one time appt [place].    The patient does have a current PCP (No primary care provider on file.) and does need an [facility] hospital follow-up appointment after discharge.    LOS: 6 days   Jones Bales, MD 01/07/2015, 12:09 PM

## 2015-01-09 ENCOUNTER — Telehealth: Payer: Self-pay | Admitting: *Deleted

## 2015-01-09 ENCOUNTER — Encounter (HOSPITAL_COMMUNITY): Payer: Self-pay | Admitting: Gastroenterology

## 2015-01-09 NOTE — Telephone Encounter (Signed)
Another call from pt's daughter stating pt has not had a BM since she got home on Sunday. What else can she take beside the Miralax. She takes Miralax twice a day. She is eating fiber and drinking water. She is up and active.  Pt # [id number]

## 2015-01-09 NOTE — Telephone Encounter (Signed)
Call from pt's daughter stating pt had been taking Meloxicam 7.5 mg BID for arthritis.  This was stopped on hospital d/c 6/20. Pt has f/u on 6/24 and daughter asking for something for pain relief until then.  Pain is in hands and legs.   Hx:   Diverticulosis of colon with hemorrhage   [facility] on [address].  Pt # [id number]

## 2015-01-09 NOTE — Telephone Encounter (Signed)
Spoke with patient's daughter about pain control. Recommended to avoid all NSAIDs (meloxicam, ibuprofen, motrin, etc) and to take Tylenol for pain. Daughter verbalized understanding. Patient has follow up appointment in IM clinic on 6/24.

## 2015-01-09 NOTE — Anesthesia Postprocedure Evaluation (Signed)
Anesthesia Post Note  Patient: Jill Short  Procedure(s) Performed: Procedure(s) (LRB): COLONOSCOPY (N/A)  Anesthesia type: mac  Patient location: PACU  Post pain: Pain level controlled  Post assessment: Post-op Vital signs reviewed  Last Vitals: BP 155/75 mmHg  Pulse 91  Temp(Src) 37.1 C (Oral)  Resp 16  Ht 5\' 6"  (1.676 m)  Wt 140 lb 10.5 oz (63.8 kg)  BMI 22.71 kg/m2  SpO2 100%  Post vital signs: Reviewed  Level of consciousness: sedated  Complications: No apparent anesthesia complications

## 2015-01-09 NOTE — Telephone Encounter (Signed)
Patient called, concerned about constipation. Recommended for her to continue miralax twice daily and then add another agent such as dulcolax, epsom salt, or a suppository. Also recommended to drink plenty of water, eat vegetables, and walk around as much as possible. Patient expressed verbal understanding.

## 2015-01-11 ENCOUNTER — Observation Stay (HOSPITAL_COMMUNITY)
Admission: AD | Admit: 2015-01-11 | Discharge: 2015-01-15 | Disposition: A | Discharge status: Home Health | Payer: Medicare Other | Source: Ambulatory Visit | Attending: Oncology | Admitting: Oncology

## 2015-01-11 ENCOUNTER — Encounter: Payer: Self-pay | Admitting: Internal Medicine

## 2015-01-11 ENCOUNTER — Ambulatory Visit (INDEPENDENT_AMBULATORY_CARE_PROVIDER_SITE_OTHER): Payer: Medicare Other | Admitting: Internal Medicine

## 2015-01-11 ENCOUNTER — Telehealth: Payer: Self-pay | Admitting: *Deleted

## 2015-01-11 ENCOUNTER — Encounter (HOSPITAL_COMMUNITY): Payer: Self-pay | Admitting: General Practice

## 2015-01-11 VITALS — BP 147/82 | HR 111 | Ht 66.0 in | Wt 139.8 lb

## 2015-01-11 DIAGNOSIS — I4891 Unspecified atrial fibrillation: Secondary | ICD-10-CM | POA: Diagnosis not present

## 2015-01-11 DIAGNOSIS — E119 Type 2 diabetes mellitus without complications: Secondary | ICD-10-CM

## 2015-01-11 DIAGNOSIS — K922 Gastrointestinal hemorrhage, unspecified: Secondary | ICD-10-CM | POA: Diagnosis present

## 2015-01-11 DIAGNOSIS — I2510 Atherosclerotic heart disease of native coronary artery without angina pectoris: Secondary | ICD-10-CM | POA: Insufficient documentation

## 2015-01-11 DIAGNOSIS — I10 Essential (primary) hypertension: Secondary | ICD-10-CM | POA: Diagnosis not present

## 2015-01-11 DIAGNOSIS — Z7982 Long term (current) use of aspirin: Secondary | ICD-10-CM | POA: Insufficient documentation

## 2015-01-11 DIAGNOSIS — K449 Diaphragmatic hernia without obstruction or gangrene: Secondary | ICD-10-CM | POA: Insufficient documentation

## 2015-01-11 DIAGNOSIS — K5731 Diverticulosis of large intestine without perforation or abscess with bleeding: Secondary | ICD-10-CM

## 2015-01-11 DIAGNOSIS — Z950 Presence of cardiac pacemaker: Secondary | ICD-10-CM | POA: Insufficient documentation

## 2015-01-11 DIAGNOSIS — K921 Melena: Secondary | ICD-10-CM | POA: Insufficient documentation

## 2015-01-11 DIAGNOSIS — M25511 Pain in right shoulder: Secondary | ICD-10-CM | POA: Diagnosis not present

## 2015-01-11 DIAGNOSIS — M7989 Other specified soft tissue disorders: Secondary | ICD-10-CM

## 2015-01-11 DIAGNOSIS — Z79899 Other long term (current) drug therapy: Secondary | ICD-10-CM | POA: Insufficient documentation

## 2015-01-11 DIAGNOSIS — R609 Edema, unspecified: Secondary | ICD-10-CM

## 2015-01-11 DIAGNOSIS — E785 Hyperlipidemia, unspecified: Secondary | ICD-10-CM | POA: Diagnosis not present

## 2015-01-11 DIAGNOSIS — K5733 Diverticulitis of large intestine without perforation or abscess with bleeding: Secondary | ICD-10-CM | POA: Diagnosis present

## 2015-01-11 DIAGNOSIS — I480 Paroxysmal atrial fibrillation: Secondary | ICD-10-CM | POA: Diagnosis present

## 2015-01-11 LAB — CBC
HEMATOCRIT: 25.1 % — AB (ref 36.0–46.0)
Hemoglobin: 8.1 g/dL — ABNORMAL LOW (ref 12.0–15.0)
MCH: 30.5 pg (ref 26.0–34.0)
MCHC: 32.3 g/dL (ref 30.0–36.0)
MCV: 94.4 fL (ref 78.0–100.0)
PLATELETS: 494 10*3/uL — AB (ref 150–400)
RBC: 2.66 MIL/uL — ABNORMAL LOW (ref 3.87–5.11)
RDW: 16.1 % — AB (ref 11.5–15.5)
WBC: 7.6 10*3/uL (ref 4.0–10.5)

## 2015-01-11 LAB — GLUCOSE, CAPILLARY
GLUCOSE-CAPILLARY: 77 mg/dL (ref 65–99)
Glucose-Capillary: 73 mg/dL (ref 65–99)

## 2015-01-11 MED ORDER — ACETAMINOPHEN 650 MG RE SUPP: 650.0000 mg | Freq: Four times a day (QID) | RECTAL | Status: DC | PRN

## 2015-01-11 MED ORDER — ASPIRIN EC 81 MG PO TBEC
81.0000 mg | DELAYED_RELEASE_TABLET | Freq: Every day | ORAL | Status: DC
  Administered 2015-01-12 – 2015-01-15 (×4): 81 mg via ORAL
  Filled 2015-01-11 (×4): qty 1

## 2015-01-11 MED ORDER — FENOFIBRATE 54 MG PO TABS
54.0000 mg | ORAL_TABLET | Freq: Every day | ORAL | Status: DC
  Administered 2015-01-12 – 2015-01-15 (×4): 54 mg via ORAL
  Filled 2015-01-11 (×4): qty 1

## 2015-01-11 MED ORDER — FERROUS SULFATE 325 (65 FE) MG PO TABS
325.0000 mg | ORAL_TABLET | Freq: Every day | ORAL | Status: DC
  Administered 2015-01-12 – 2015-01-15 (×4): 325 mg via ORAL
  Filled 2015-01-11 (×6): qty 1

## 2015-01-11 MED ORDER — FENOFIBRATE 54 MG PO TABS: 50.0000 mg | ORAL_TABLET | Freq: Every day | ORAL | Status: DC

## 2015-01-11 MED ORDER — ACETAMINOPHEN 325 MG PO TABS
650.0000 mg | ORAL_TABLET | Freq: Four times a day (QID) | ORAL | Status: DC | PRN
  Administered 2015-01-12: 650 mg via ORAL
  Filled 2015-01-11: qty 2

## 2015-01-11 MED ORDER — SODIUM CHLORIDE 0.9 % IJ SOLN
3.0000 mL | Freq: Two times a day (BID) | INTRAMUSCULAR | Status: DC
  Administered 2015-01-11 – 2015-01-15 (×8): 3 mL via INTRAVENOUS

## 2015-01-11 MED ORDER — INSULIN ASPART 100 UNIT/ML ~~LOC~~ SOLN: 0.0000 [IU] | Freq: Three times a day (TID) | SUBCUTANEOUS | Status: DC

## 2015-01-11 MED ORDER — PANTOPRAZOLE SODIUM 40 MG PO TBEC
40.0000 mg | DELAYED_RELEASE_TABLET | Freq: Every day | ORAL | Status: DC
  Administered 2015-01-11 – 2015-01-12 (×2): 40 mg via ORAL
  Filled 2015-01-11 (×2): qty 1

## 2015-01-11 NOTE — H&P (Signed)
Date: 01/11/2015               Patient Name:  Jill Short MRN: [id number]  DOB: [birth year] Age / Sex: 81 y.o., female   PCP: No primary care provider on file.         Medical Service: Internal Medicine Teaching Service         Attending Physician: Dr. Annia Belt, MD    First Contact: Dr. Albin Felling Pager: [phone]  Second Contact: Dr. Naaman Plummer Pager: [phone]       After Hours (After 5p/  First Contact Pager: [phone]  weekends / holidays): Second Contact Pager: [phone]   Chief Complaint: BRBPR  History of Present Illness: Pt is an 81 y/o F w/ PMHx of afib w/ pacemaker on eliquis, HTN, DM, and HLD who presents with bright red blood per rectum. She was recently admitted 6/12-20 for the same reason. GI performed a colonoscopy which showed severe diverticulosis with no active bleeding. GI recommended for her to continue Miralax indefinitely. Her home eliquis for her afib and anti HTN meds were held on discharge and pt confirms she has not been taking those medications. After discharge pt states she was constipated for 2-3 days. She called clinic and was given epsom salts after which she had 10 loose BMs. Stools were black and toilet bowel water was a dark blk color. Yesterday she had 2 BMs that were blk as well. Then today while in clinic she had a BM with melena and much bright red blood. That was the first time she has noticed blood in her stools since discharge. Pt denies SOB, weakness, fatigue, blurry vision, chest pain, dizziness, lightheadedness, and skin pallor. Also since discharge pt has had leg edema that has not gone done, with her left leg larger than the right. She has been active and ambulatory since discharge. Denies leg pain or erythema.    Meds: No current facility-administered medications for this encounter.    Allergies: Allergies as of 01/11/2015  . (No Known Allergies)   Past Medical History  Diagnosis Date  . Coronary artery disease   . Pacemaker     Past Surgical History  Procedure Laterality Date  . Colonoscopy N/A 01/06/2015    Procedure: COLONOSCOPY;  Surgeon: Laurence Spates, MD;  Location: [facility] ENDOSCOPY;  Service: Endoscopy;  Laterality: N/A;   History reviewed. No pertinent family history. History   Social History  . Marital Status: Married    Spouse Name: N/A  . Number of Children: N/A  . Years of Education: N/A   Occupational History  . Not on file.   Social History Main Topics  . Smoking status: Never Smoker   . Smokeless tobacco: Not on file  . Alcohol Use: No  . Drug Use: No  . Sexual Activity: Not on file   Other Topics Concern  . Not on file   Social History Narrative    Review of Systems: Pertinent items are noted in HPI.  Physical Exam: Blood pressure 117/81, pulse 71, temperature 98 F (36.7 C), temperature source Oral, resp. rate 18, SpO2 100 %. Physical Exam  Constitutional: She appears well-developed and well-nourished. No distress.  HENT:  Head: Normocephalic.  Mouth/Throat: Oropharynx is clear and moist.  Cardiovascular: Normal rate.   Mitral regurgitation murmur radiating to axilla    Pulmonary/Chest: Effort normal and breath sounds normal. She has no wheezes. She has no rales.  Abdominal: Soft. Bowel sounds are normal. She exhibits no distension. There is no  tenderness.  Musculoskeletal: She exhibits edema (2+ pitting edema b/l, left calf larger than right, non tender, no erythema ).  Neurological: She is alert.  Skin: Skin is warm and dry. She is not diaphoretic. No pallor.    Lab results:  CBC:  Recent Labs  01/11/15 1532  WBC 7.6  HGB 8.1*  HCT 25.1*  MCV 94.4  PLT 494*   CBG:  Recent Labs  01/11/15 1725  GLUCAP 73    Assessment & Plan by Problem: Principal Problem:   Hematochezia Active Problems:   DM (diabetes mellitus)   Atrial fibrillation  Hematochezia in the setting of severe diverticulosis-- Pt had a bloody bowel movement today in clinic which was  the first since her discharge on 6/18. She has had dark blk stools but is also on iron supplements at home. Her hgb was 8.1 today and 7.9 on 6/18. She is asymptomatic. Colonoscopy on 6/17 revealed severe diverticulosis throughout the colon and was neg for active bleeding. She was started on miralax indefinitely. Will continue to monitor. - admit to tele - orthostatic vitals - cont protonix 40 daily - transfusion goal hgb >8 - cont home ferrous sulfate 325mg  daily w/ breakfast  Leg edema-- pt was given fluids and 4 units of RBC during her last admission. Her left leg is larger than her right. She states she has been active since discharge and her daughter has made pt walk around more. On physical exam her left leg is non tender and there is no reddness or warmth. Wells score for DVT 1 indicating low risk group for DVT.  - will proceed w/ LE dopplers  DM-- on metformin 500mg  qd at home - SSI achs  HTN-- on lopressor 25mg  qhs and ramipril 10mg  daily at home which were held on d/c.  -cont to hold home meds due to soft BPs  Atrial fibrillation w/ pacemaker-- held on d/c from hospital d/c, was suppose to resume on 6/25.  - cont to hold eliquis - cont asa 81mg   HLD--- cont fenofibrate 51mg   Diet- liquid diet DVT ppx- SCDs CODE: FULL   Dispo: Disposition is deferred at this time, awaiting improvement of current medical problems.   The patient does have a current PCP (Dr Mariel Craft) and does not need an [facility] hospital follow-up appointment after discharge.  The patient does not have transportation limitations that hinder transportation to clinic appointments.   Signed: Norman Herrlich, MD 01/11/2015, 7:15 PM

## 2015-01-11 NOTE — Progress Notes (Signed)
Admission note:  Arrival Method: Wheelchair from clinic Mental Orientation: A&OX4 Telemetry: N/A Assessment: See flow sheet Skin: Dry; intact- Redness on buttocks but blanchable  IV: N/A Pain: Denies Tubes: N/A Safety Measures: Bed in lowest position. Non skid socks placed, bed alarm activate, call light and other belongings within reach [place]: Reviewed with pt.  Admission Screening: Completed 6700 Orientation: Patient has been oriented to the unit, staff and to the room.  Awaiting on orders from MD. Will continue to monitor pt.

## 2015-01-11 NOTE — Telephone Encounter (Signed)
Pt called stating the Epsom salts have worked and she is able to have a BM.  She then states that the stool is dark black.  Pt # [phone]  I called pt back and she states the stool is dark black, no bright red blood noted. This is the first time stool has looked like this.   She also reports her legs are swollen, also new.  Denies dizziness, lightheadedness, abd pain.  She has a scheduled appointment on 6/24 with Dr Heber Hana, I have given her an appointment for today at 3:45.  Earliest she can get a ride. Her husband is in the hospital s/p head bleed and may need surgery again.  She will call if this complicates her visit.  Hx: Diverticulosis of colon with hemorrhage, Hospital d/c 6/20.

## 2015-01-11 NOTE — Progress Notes (Signed)
   Subjective:    Patient ID: Jill Short, female    DOB: 03-Feb-1931, 81 y.o.   MRN: [id number]  HPI  Jill Short is a 81 year old woman with atrial fibrillation with pacemaker on eliquis, HTN, DM2, HL, OA here for hospital follow-up. She was admitted to [facility] (visiting from [place]) 6/12-6/18/16 for bright red blood per rectum. Her hemoglobin dropped from 9.6 to 6.6 and she got 2 U PRBC. Prior colonoscopy notable for diverticulosis and she was on eliquis for atrial fibrillation and taking meloxicam for osteoarthritis. GI was consulted and initially recommended observation. She continued to have intermittent bloody bowel movements and required an additional 2 U PRBC. GI then performed a colonoscopy that revealed severe diverticulosis without active bleeding. They recommended continued miralax indefinitely. On discharge, medications held included lopressor 25 mg qhs, ramipril 10 mg daily, and her eliquis (told to resume in one week).  Jill Short was scheduled for hospital follow-up on 6/24 but called today out of concern about a dark black stool without bright red blood. Her daughter reports that she was initially constipated but then two days ago after taking epsom salt had 10 bowel movements. The daughter did not see any of these and patient did not comment on the appearance. Yesterday the daughter did see that there was a dark bowel movement and called for appointment today.The patient denies any weakness, lightheadedness, presyncope, dizziness, abdominal pain, nausea, emesis. Daughter notes that patient has not been taking her eliquis since discharge.  After vitals checked in [facility], Jill Short went to bathroom and had a bowel movement with melena and much bright red blood, shown to the [facility] staff and providers.  Review of Systems  Constitutional: Negative for fever, chills, diaphoresis and fatigue.  Respiratory: Negative for shortness of breath.   Cardiovascular: Positive for leg swelling.  Negative for chest pain and palpitations.  Gastrointestinal: Positive for blood in stool. Negative for nausea, vomiting, abdominal pain, diarrhea and constipation.  Neurological: Negative for dizziness, weakness, light-headedness, numbness and headaches.       Objective:   Physical Exam  Constitutional: She is oriented to person, place, and time. She appears well-developed and well-nourished. No distress.  Sitting in wheelchair  HENT:  Head: Normocephalic and atraumatic.  Mouth/Throat: Oropharynx is clear and moist.  Eyes: EOM are normal. Pupils are equal, round, and reactive to light.  Cardiovascular: Normal heart sounds.  Exam reveals no gallop and no friction rub.   No murmur heard. Irregularly irregular  Pulmonary/Chest: Effort normal and breath sounds normal. No respiratory distress. She has no wheezes.  Abdominal: Soft. Bowel sounds are normal. She exhibits no distension. There is no tenderness.  Musculoskeletal:  B/l 2+ pitting edema to shins  Neurological: She is alert and oriented to person, place, and time.  Skin: She is not diaphoretic.  Vitals reviewed.     Assessment & Plan:

## 2015-01-11 NOTE — Assessment & Plan Note (Signed)
See HPI. In brief, Jill Short was recently discharged from [facility] for diverticular GI bleed. She had dark stool yesterday so daughter made appointment today and in [facility] clinic had large black bowel movement with much frank blood per rectum. Her orthostatics were 125/74 with HR 83 lying, 100/67 with HR 77 sitting, and 147/82 with HR 111 standing. She denies symptoms such as weakness, dizziness, lightheadedness but daughter thinks that she seems different such as more irritable. She denies any abdominal complaints and abdomen benign on exam. Daughter is also concerned that she has to work tomorrow to leave her alone if she continues to have bloody bowel movements. STAT CBC with hemoglobin 8.1 stable from 7.9 on discharge. However, given several bowel movements of melena, one bowel movement with much bright red blood witnessed by [facility] staff and providers, orthostatics, and possibly being alone at home, will admit for observation -admit for observation to IMTS

## 2015-01-11 NOTE — Telephone Encounter (Signed)
Thank you Edd Fabian... I agree with your recommendation for an appointment today

## 2015-01-11 NOTE — Progress Notes (Signed)
INTERNAL MEDICINE TEACHING ATTENDING ADDENDUM - Jill Hemstreet, MD: I reviewed and discussed at the time of visit with the resident Dr. Rothman, the patient's medical history, physical examination, diagnosis and results of pertinent tests and treatment and I agree with the patient's care as documented.  

## 2015-01-12 ENCOUNTER — Observation Stay (HOSPITAL_COMMUNITY): Payer: Medicare Other

## 2015-01-12 DIAGNOSIS — Z950 Presence of cardiac pacemaker: Secondary | ICD-10-CM

## 2015-01-12 DIAGNOSIS — E119 Type 2 diabetes mellitus without complications: Secondary | ICD-10-CM | POA: Diagnosis not present

## 2015-01-12 DIAGNOSIS — I4891 Unspecified atrial fibrillation: Secondary | ICD-10-CM

## 2015-01-12 DIAGNOSIS — R609 Edema, unspecified: Secondary | ICD-10-CM | POA: Diagnosis not present

## 2015-01-12 DIAGNOSIS — R600 Localized edema: Secondary | ICD-10-CM

## 2015-01-12 DIAGNOSIS — E785 Hyperlipidemia, unspecified: Secondary | ICD-10-CM

## 2015-01-12 DIAGNOSIS — Z79899 Other long term (current) drug therapy: Secondary | ICD-10-CM

## 2015-01-12 DIAGNOSIS — K5730 Diverticulosis of large intestine without perforation or abscess without bleeding: Secondary | ICD-10-CM | POA: Diagnosis not present

## 2015-01-12 DIAGNOSIS — K922 Gastrointestinal hemorrhage, unspecified: Secondary | ICD-10-CM | POA: Diagnosis not present

## 2015-01-12 DIAGNOSIS — I2510 Atherosclerotic heart disease of native coronary artery without angina pectoris: Secondary | ICD-10-CM | POA: Diagnosis not present

## 2015-01-12 DIAGNOSIS — K5731 Diverticulosis of large intestine without perforation or abscess with bleeding: Secondary | ICD-10-CM

## 2015-01-12 DIAGNOSIS — I10 Essential (primary) hypertension: Secondary | ICD-10-CM

## 2015-01-12 DIAGNOSIS — K921 Melena: Secondary | ICD-10-CM | POA: Diagnosis not present

## 2015-01-12 LAB — CBC
HCT: 22.6 % — ABNORMAL LOW (ref 36.0–46.0)
HCT: 28.9 % — ABNORMAL LOW (ref 36.0–46.0)
HEMOGLOBIN: 7.2 g/dL — AB (ref 12.0–15.0)
Hemoglobin: 9.4 g/dL — ABNORMAL LOW (ref 12.0–15.0)
MCH: 30.3 pg (ref 26.0–34.0)
MCH: 30.5 pg (ref 26.0–34.0)
MCHC: 31.9 g/dL (ref 30.0–36.0)
MCHC: 32.5 g/dL (ref 30.0–36.0)
MCV: 95 fL (ref 78.0–100.0)
MCV: 93.8 fL (ref 78.0–100.0)
PLATELETS: 455 10*3/uL — AB (ref 150–400)
Platelets: 411 10*3/uL — ABNORMAL HIGH (ref 150–400)
RBC: 2.38 MIL/uL — AB (ref 3.87–5.11)
RBC: 3.08 MIL/uL — ABNORMAL LOW (ref 3.87–5.11)
RDW: 16.4 % — ABNORMAL HIGH (ref 11.5–15.5)
RDW: 16.1 % — ABNORMAL HIGH (ref 11.5–15.5)
WBC: 4.3 10*3/uL (ref 4.0–10.5)
WBC: 6 10*3/uL (ref 4.0–10.5)

## 2015-01-12 LAB — GLUCOSE, CAPILLARY
GLUCOSE-CAPILLARY: 154 mg/dL — AB (ref 65–99)
Glucose-Capillary: 78 mg/dL (ref 65–99)
Glucose-Capillary: 67 mg/dL (ref 65–99)
Glucose-Capillary: 76 mg/dL (ref 65–99)
Glucose-Capillary: 77 mg/dL (ref 65–99)
Glucose-Capillary: 69 mg/dL (ref 65–99)
Glucose-Capillary: 126 mg/dL — ABNORMAL HIGH (ref 65–99)

## 2015-01-12 LAB — PREPARE RBC (CROSSMATCH)

## 2015-01-12 MED ORDER — ACETAMINOPHEN 650 MG RE SUPP: 650.0000 mg | RECTAL | Status: DC | PRN

## 2015-01-12 MED ORDER — PANTOPRAZOLE SODIUM 40 MG IV SOLR
40.0000 mg | Freq: Two times a day (BID) | INTRAVENOUS | Status: DC
  Administered 2015-01-12 – 2015-01-14 (×4): 40 mg via INTRAVENOUS
  Filled 2015-01-12 (×6): qty 40

## 2015-01-12 MED ORDER — SODIUM CHLORIDE 0.9 % IV SOLN
Freq: Once | INTRAVENOUS | Status: AC
  Administered 2015-01-12: 08:00:00 via INTRAVENOUS

## 2015-01-12 MED ORDER — ACETAMINOPHEN 325 MG PO TABS: 650.0000 mg | ORAL_TABLET | ORAL | Status: DC | PRN

## 2015-01-12 NOTE — Progress Notes (Signed)
*  PRELIMINARY RESULTS* Vascular Ultrasound Lower extremity venous duplex has been completed.  Preliminary findings: negative for DVT  Landry Mellow, RDMS, RVT  01/12/2015, 2:43 PM

## 2015-01-12 NOTE — Progress Notes (Signed)
Pt with complaints R shoulder pain, new onset. Pt states it hurts when she just lifts it and is concerned she "may have pulled something". No visible signs of trauma noted. Medicated with 650 mg tylenol po. Will continue to monitor. Dorthey Sawyer, RN

## 2015-01-12 NOTE — Consult Note (Signed)
Reason for Consult:Hematochezia  Referring Physician: Kyria Short is an 81 y.o. female.  HPI: Jill Short was readmitted to the hospital for an episode of melena and BRBPR. She was discharged just 3 days ago following the same complaint and being diagnosed with diverticulosis. Colonoscopy did not localize any obvious source of bleeding. She had some constipation while at home that responded to Epsom salts. She denied any new symptoms. She was on Elequis for afib but has been off of that since the last admission.  Past Medical History  Diagnosis Date  . Coronary artery disease   . Pacemaker     Past Surgical History  Procedure Laterality Date  . Colonoscopy N/A 01/06/2015    Procedure: COLONOSCOPY;  Surgeon: Laurence Spates, MD;  Location: [facility] ENDOSCOPY;  Service: Endoscopy;  Laterality: N/A;    History reviewed. No pertinent family history.  Social History:  reports that she has never smoked. She does not have any smokeless tobacco history on file. She reports that she does not drink alcohol or use illicit drugs.  Allergies: No Known Allergies  Medications: I have reviewed the patient's current medications.  Results for orders placed or performed during the hospital encounter of 01/11/15 (from the past 48 hour(s))  Glucose, capillary     Status: None   Collection Time: 01/11/15  5:25 PM  Result Value Ref Range   Glucose-Capillary 73 65 - 99 mg/dL  Glucose, capillary     Status: None   Collection Time: 01/11/15  8:14 PM  Result Value Ref Range   Glucose-Capillary 77 65 - 99 mg/dL  Glucose, capillary     Status: None   Collection Time: 01/12/15 12:54 AM  Result Value Ref Range   Glucose-Capillary 78 65 - 99 mg/dL  CBC     Status: Abnormal   Collection Time: 01/12/15  6:04 AM  Result Value Ref Range   WBC 4.3 4.0 - 10.5 K/uL   RBC 2.38 (L) 3.87 - 5.11 MIL/uL   Hemoglobin 7.2 (L) 12.0 - 15.0 g/dL   HCT 22.6 (L) 36.0 - 46.0 %   MCV 95.0 78.0 - 100.0 fL   MCH  30.3 26.0 - 34.0 pg   MCHC 31.9 30.0 - 36.0 g/dL   RDW 16.4 (H) 11.5 - 15.5 %   Platelets 411 (H) 150 - 400 K/uL  Glucose, capillary     Status: None   Collection Time: 01/12/15  7:49 AM  Result Value Ref Range   Glucose-Capillary 67 65 - 99 mg/dL  Prepare RBC     Status: None   Collection Time: 01/12/15  7:54 AM  Result Value Ref Range   Order Confirmation ORDER PROCESSED BY BLOOD BANK   Type and screen     Status: None (Preliminary result)   Collection Time: 01/12/15  7:54 AM  Result Value Ref Range   ABO/RH(D) A NEG    Antibody Screen NEG    Sample Expiration 01/15/2015    Unit Number [id number]    Blood Component Type RED CELLS,LR    Unit division 00    Status of Unit ALLOCATED    Transfusion Status OK TO TRANSFUSE    Crossmatch Result Compatible    Unit Number [id number]    Blood Component Type RED CELLS,LR    Unit division 00    Status of Unit ALLOCATED    Transfusion Status OK TO TRANSFUSE    Crossmatch Result Compatible   Glucose, capillary     Status: Abnormal  Collection Time: 01/12/15  8:58 AM  Result Value Ref Range   Glucose-Capillary 154 (H) 65 - 99 mg/dL   Review of Systems  Constitutional: Negative for malaise/fatigue.  HENT: Negative for nosebleeds.   Respiratory: Negative for cough and shortness of breath.   Cardiovascular: Positive for leg swelling. Negative for chest pain.  Gastrointestinal: Positive for diarrhea, constipation, blood in stool and melena. Negative for nausea, vomiting and abdominal pain.  Genitourinary: Negative for dysuria, urgency, frequency and hematuria.  Musculoskeletal: Negative for myalgias, back pain and falls.  Neurological: Negative for dizziness, focal weakness, weakness and headaches.  Endo/Heme/Allergies: Does not bruise/bleed easily.   Blood pressure 127/61, pulse 74, temperature 98 F (36.7 C), temperature source Oral, resp. rate 17, height 5\' 6"  (1.676 m), weight 60.782 kg (134 lb), SpO2 100 %. Physical Exam   Constitutional: She appears well-developed and well-nourished. No distress.  HENT:  Head: Normocephalic and atraumatic.  Mouth/Throat: No oropharyngeal exudate.  Eyes: Conjunctivae are normal. Pupils are equal, round, and reactive to light. No scleral icterus.  Neck: Neck supple.  Cardiovascular: Normal rate, regular rhythm and intact distal pulses.  Exam reveals no gallop and no friction rub.   Murmur heard. Respiratory: Effort normal and breath sounds normal. No respiratory distress. She has no wheezes. She has no rales.  GI: Soft. Bowel sounds are normal. She exhibits no distension and no mass. There is no tenderness. There is no rebound and no guarding.  Musculoskeletal: Normal range of motion.  Lymphadenopathy:    She has no cervical adenopathy.  Neurological: She is alert.  Skin: Skin is warm and dry. She is not diaphoretic.  Psychiatric: She has a normal mood and affect. Her behavior is normal.    Assessment/Plan: Hematochezia -- Likely secondary to diverticulosis but can't be sure. Consider repeat GI involvement for another colonoscopy and IR involvement for tagged RBC scan. If unable to localize the source of bleeding could manage non-operatively with transfusions prn vs subtotal colectomy. We will follow along and await the results of her localizing procedures.   Thank you for allowing [place] to participate in this lovely woman's care.    Lisette Abu, PA-C Pager: [phone] 01/12/2015, 10:49 AM

## 2015-01-12 NOTE — Consult Note (Signed)
Referring Provider: Dr. Beryle Beams Primary Care Physician:  No primary care provider on file. Primary Gastroenterologist:  Althia Forts  Reason for Consultation:  GI bleed  HPI: Jill Short is a 81 y.o. female who was recently in the hospital for a GI bleed and had a colonoscopy last week by Dr. Oletta Lamas that showed diffuse diverticulosis without any blood seen comes back in with episodes of black and red blood per rectum (pt unsure how many times or duration of the bleeding). Reports that she took Epson salts following recent discharge because of constipation. Denies history of peptic ulcer disease. Denies dizziness, nausea, vomiting, abdominal pain, chest pain, or shortness of breath. Denies NSAIDs. Hgb 8.1 on admit and now 7.2 (7.9 during last admit on 6/18). On Eliquis, Aspirin and Meloxicam prior to last admit but Eliquis and Meloxicam were held at discharge and continued on Aspirin 81 mg/day. Patient is from [place] and was passing through from [place] to visit her daughter here.  Past Medical History  Diagnosis Date  . Coronary artery disease   . Pacemaker     Past Surgical History  Procedure Laterality Date  . Colonoscopy N/A 01/06/2015    Procedure: COLONOSCOPY;  Surgeon: Laurence Spates, MD;  Location: [facility] ENDOSCOPY;  Service: Endoscopy;  Laterality: N/A;    Prior to Admission medications   Medication Sig Start Date End Date Taking? Authorizing Provider  aspirin EC 81 MG tablet Take 81 mg by mouth daily.   Yes Historical Provider, MD  bacitracin ointment Apply 1 application topically 2 (two) times daily.   Yes Historical Provider, MD  cholecalciferol (VITAMIN D) 1000 UNITS tablet Take 1,000 Units by mouth daily.   Yes Historical Provider, MD  Cyanocobalamin (B-12 PO) Take 1 tablet by mouth daily.   Yes Historical Provider, MD  fenofibrate (TRIGLIDE) 50 MG tablet Take 50 mg by mouth daily.   Yes Historical Provider, MD  ferrous sulfate 325 (65 FE) MG tablet Take 1 tablet (325 mg total)  by mouth daily with breakfast. 01/07/15  Yes Jones Bales, MD  Flaxseed, Linseed, (FLAX PO) Take 1,200 mg by mouth daily.   Yes Historical Provider, MD  metFORMIN (GLUCOPHAGE) 500 MG tablet Take 500 mg by mouth 2 (two) times daily with a meal.   Yes Historical Provider, MD  vitamin C (ASCORBIC ACID) 500 MG tablet Take 500 mg by mouth daily.   Yes Historical Provider, MD  pantoprazole (PROTONIX) 40 MG tablet Take 1 tablet (40 mg total) by mouth daily. 01/06/15   Jones Bales, MD  polyethylene glycol (MIRALAX / GLYCOLAX) packet Take 17 g by mouth 2 (two) times daily. 01/07/15   Jones Bales, MD    Scheduled Meds: . aspirin EC  81 mg Oral Daily  . fenofibrate  54 mg Oral Daily  . ferrous sulfate  325 mg Oral Q breakfast  . insulin aspart  0-9 Units Subcutaneous TID WC  . pantoprazole  40 mg Oral Daily  . sodium chloride  3 mL Intravenous Q12H   Continuous Infusions:  PRN Meds:.acetaminophen **OR** acetaminophen  Allergies as of 01/11/2015  . (No Known Allergies)    History reviewed. No pertinent family history.  History   Social History  . Marital Status: Married    Spouse Name: N/A  . Number of Children: N/A  . Years of Education: N/A   Occupational History  . Not on file.   Social History Main Topics  . Smoking status: Never Smoker   . Smokeless tobacco: Not on  file  . Alcohol Use: No  . Drug Use: No  . Sexual Activity: Not on file   Other Topics Concern  . Not on file   Social History Narrative    Review of Systems: All negative except as stated above in HPI.  Physical Exam: Vital signs: Filed Vitals:   01/12/15 1136  BP: 115/61  Pulse: 70  Temp: 97.9 F (36.6 C)  Resp: 20   Last BM Date: 01/11/15 General:   Alert,  Elderly, thin, pleasant and cooperative in NAD Head: atraumatic Eyes: pupils equal and reactive, anicteric sclera ENT: oropharynx clear Neck: supple, nontender Lungs:  Clear throughout to auscultation.   No wheezes, crackles, or  rhonchi. No acute distress. Heart:  Regular rate and rhythm; no murmurs, clicks, rubs,  or gallops. Abdomen: soft, nontender, nondistended, +BS Rectal:  Deferred Ext: no edema Skin: no rash Psych: normal mood, affect  GI:  Lab Results:  Recent Labs  01/11/15 1532 01/12/15 0604  WBC 7.6 4.3  HGB 8.1* 7.2*  HCT 25.1* 22.6*  PLT 494* 411*   BMET No results for input(s): NA, K, CL, CO2, GLUCOSE, BUN, CREATININE, CALCIUM in the last 72 hours. LFT No results for input(s): PROT, ALBUMIN, AST, ALT, ALKPHOS, BILITOT, BILIDIR, IBILI in the last 72 hours. PT/INR No results for input(s): LABPROT, INR in the last 72 hours.   Studies/Results: No results found.  Impression/Plan: 81 yo with GI bleed with melena and hematochezia. Doubt diverticular source. Needs EGD to evaluate for upper tract source and if unrevealing, then will need an inpt capsule endoscopy. Keep on clear liquid diet. NPO p MN. Change to IV PPI Q 12 hours. EGD tomorrow. Risks/benefits discussed with patient and she agrees to proceed.      [place] C.  01/12/2015, 1:08 PM  Pager [phone]  If no answer or after 5 PM call [phone]

## 2015-01-12 NOTE — Plan of Care (Signed)
Problem: Phase I Progression Outcomes Goal: Other Phase I Outcomes/Goals Outcome: Progressing Patient ordered 1 unit  Packed red cells for decrease in hemoglobin. Education provided. Consent signed.

## 2015-01-12 NOTE — Progress Notes (Addendum)
Subjective: Patient returning to the hospital after having a dark stool at home followed by a dark stool with bright red blood per rectum at her clinic appointment. Her Hbg was 7.2 this AM. She denies any abdominal pain, nausea, chest pain, or dyspnea. She states she has been avoiding NSAIDs and taking Miralax twice daily at home. She had felt constipated after discharge and called the clinic about which medications to take. She was recommended to take Miralax along with another agent (she chose epsom salt) and then had frequent bowel movements. Unclear if these were dark or bloody.   Will give 1 unit of pRBCs.   Objective: Vital signs in last 24 hours: Filed Vitals:   01/11/15 2005 01/12/15 0501 01/12/15 0811 01/12/15 0950  BP: 141/66 144/70 131/57 127/61  Pulse: 70 70 70 74  Temp: 98.3 F (36.8 C) 98.5 F (36.9 C) 97.8 F (36.6 C) 98 F (36.7 C)  TempSrc:   Oral Oral  Resp: 17 18 16 17   Height: 5\' 6"  (1.676 m)     Weight: 134 lb (60.782 kg)     SpO2: 100% 97% 100%    Weight change:   Intake/Output Summary (Last 24 hours) at 01/12/15 1027 Last data filed at 01/12/15 0951  Gross per 24 hour  Intake   1813 ml  Output   2750 ml  Net   -937 ml   Physical Exam General: sitting up in chair eating jello, NAD HEENT: [place]/AT, EOMI, sclera anicteric, mucus membranes moist CV: RRR, 2/6 systolic murmur appreciated Pulm: CTA bilaterally, breaths non-labored  Abd: BS+, soft, non-tender Ext: 2+ pitting edema bilaterally. LLE is larger than RLE. No tenderness to palpation.  Neuro: alert and oriented x 3, no focal deficits  Lab Results: Basic Metabolic Panel:  Recent Labs Lab 01/06/15 0851  NA 141  K 3.7  CL 109  CO2 27  GLUCOSE 106*  BUN <5*  CREATININE 0.72  CALCIUM 8.8*   CBC:  Recent Labs Lab 01/11/15 1532 01/12/15 0604  WBC 7.6 4.3  HGB 8.1* 7.2*  HCT 25.1* 22.6*  MCV 94.4 95.0  PLT 494* 411*   CBG:  Recent Labs Lab 01/07/15 1602 01/11/15 1725  01/11/15 2014 01/12/15 0054 01/12/15 0749 01/12/15 0858  GLUCAP 116* 73 77 78 67 154*    Medications: I have reviewed the patient's current medications. Scheduled Meds: . aspirin EC  81 mg Oral Daily  . fenofibrate  54 mg Oral Daily  . ferrous sulfate  325 mg Oral Q breakfast  . insulin aspart  0-9 Units Subcutaneous TID WC  . pantoprazole  40 mg Oral Daily  . sodium chloride  3 mL Intravenous Q12H   Continuous Infusions:  PRN Meds:.acetaminophen **OR** acetaminophen Assessment/Plan:  GI Bleeding in Setting of Severe Diverticulosis: Patient presenting with continued GI bleeding. Appears that she is having both melena and hematochezia based on stool observed yesterday. She had a colonoscopy on 6/17 which showed severe diverticulosis throughout the entire colon. No localized area of active bleeding was noted during this scope. Patient is also on iron supplementation which could be the cause for her dark stools as well. Possible that her bleeding could be coming from her diverticulosis or possibly an AVM further up the GI tract. She may benefit from a capsule study. Her Hbg was 7.9 on discharge on 6/18, then 8.1 on admission, now down to 7.2 this morning. Will give 1 unit pRBCs. I have spoken with general surgery so that the patient is  on their radar if the bleeding becomes more profuse and she requires a subtotal colectomy. Will also speak with GI to see if a capsule study is indicated. Will monitor closely for now. - GI consulted, appreciate recommendations - General Surgery consulted, appreciate recommendations - 1 unit pRBCs, f/u post transfusion CBC - Will start IVF if BPs become soft  - Tagged RBC scan/CT Angiography likely not to be helpful since patient is not having active bleeding at the rate the for which the scan would be beneficial - CBC in AM - Clears for now - Continue iron supplementation - Continue Protonix 40 mg daily  - Cardiac monitoring  Lower Extremity Edema:  Patient with bilateral 2+ pitting edema and with LLE greater than the right. No tenderness to palpation of her lower extremities. Wells score is 1, low risk.  - f/u LE dopplers  Type 2 DM: Patient takes Metformin 500 mg daily at home.  - Hold Metformin - Continue sensitive ISS - CBGs 4 times daily   HTN: BPs stable in the 527-782U systolic. Patient takes Lopressor 25 mg QHS and Ramipril 10 mg daily at home, but these were held on her last hospital discharge due to soft BPs and her GI bleeding.  - Continue to hold given GI bleeding   Atrial Fibrillation with Pacemaker: Patient normally on Eliquis at home for her AFib but this was held upon discharge on 6/18 due to her GI bleeding. She was supposed to resume Eliquis on 6/25 but she has had continued GI bleeding.  - Hold Eliquis - Continue ASA 81 mg daily   Hyperlipidemia: Patient takes Fenofibrate 50 mg daily at home. - Continue Fenofibrate   Diet: Clear liquids  VTE PPx: SCDs Dispo: Disposition is deferred at this time, awaiting improvement of current medical problems.  Anticipated discharge in approximately 1-2 day(s).   The patient does have a current PCP (No primary care provider on file.) and does need an [facility] hospital follow-up appointment after discharge.  The patient does not have transportation limitations that hinder transportation to clinic appointments.  .Services Needed at time of discharge: Y = Yes, Blank = No PT:   OT:   RN:   Equipment:   Other:       Juliet Rude, MD 01/12/2015, 10:27 AM

## 2015-01-13 ENCOUNTER — Observation Stay (HOSPITAL_COMMUNITY): Payer: Medicare Other

## 2015-01-13 ENCOUNTER — Observation Stay (HOSPITAL_COMMUNITY): Payer: Medicare Other | Admitting: Anesthesiology

## 2015-01-13 ENCOUNTER — Encounter (HOSPITAL_COMMUNITY): Payer: Self-pay | Admitting: *Deleted

## 2015-01-13 ENCOUNTER — Ambulatory Visit: Payer: Medicare Other | Admitting: Internal Medicine

## 2015-01-13 ENCOUNTER — Encounter (HOSPITAL_COMMUNITY)
Admission: AD | Disposition: A | Discharge status: Home Health | Payer: Self-pay | Source: Ambulatory Visit | Attending: Oncology

## 2015-01-13 DIAGNOSIS — R600 Localized edema: Secondary | ICD-10-CM | POA: Diagnosis not present

## 2015-01-13 DIAGNOSIS — Z950 Presence of cardiac pacemaker: Secondary | ICD-10-CM | POA: Diagnosis not present

## 2015-01-13 DIAGNOSIS — K625 Hemorrhage of anus and rectum: Secondary | ICD-10-CM | POA: Diagnosis not present

## 2015-01-13 DIAGNOSIS — E119 Type 2 diabetes mellitus without complications: Secondary | ICD-10-CM | POA: Diagnosis not present

## 2015-01-13 DIAGNOSIS — I2510 Atherosclerotic heart disease of native coronary artery without angina pectoris: Secondary | ICD-10-CM | POA: Diagnosis not present

## 2015-01-13 DIAGNOSIS — E785 Hyperlipidemia, unspecified: Secondary | ICD-10-CM | POA: Diagnosis not present

## 2015-01-13 DIAGNOSIS — K5730 Diverticulosis of large intestine without perforation or abscess without bleeding: Secondary | ICD-10-CM | POA: Diagnosis not present

## 2015-01-13 DIAGNOSIS — K5731 Diverticulosis of large intestine without perforation or abscess with bleeding: Secondary | ICD-10-CM | POA: Diagnosis not present

## 2015-01-13 DIAGNOSIS — K921 Melena: Secondary | ICD-10-CM | POA: Diagnosis not present

## 2015-01-13 DIAGNOSIS — I10 Essential (primary) hypertension: Secondary | ICD-10-CM | POA: Diagnosis not present

## 2015-01-13 DIAGNOSIS — I4891 Unspecified atrial fibrillation: Secondary | ICD-10-CM | POA: Diagnosis not present

## 2015-01-13 DIAGNOSIS — K922 Gastrointestinal hemorrhage, unspecified: Secondary | ICD-10-CM | POA: Diagnosis not present

## 2015-01-13 HISTORY — PX: ESOPHAGOGASTRODUODENOSCOPY: SHX5428

## 2015-01-13 LAB — CBC
HEMATOCRIT: 28.3 % — AB (ref 36.0–46.0)
HEMOGLOBIN: 9.2 g/dL — AB (ref 12.0–15.0)
MCH: 30.6 pg (ref 26.0–34.0)
MCHC: 32.5 g/dL (ref 30.0–36.0)
MCV: 94 fL (ref 78.0–100.0)
Platelets: 426 10*3/uL — ABNORMAL HIGH (ref 150–400)
RBC: 3.01 MIL/uL — ABNORMAL LOW (ref 3.87–5.11)
RDW: 16.3 % — ABNORMAL HIGH (ref 11.5–15.5)
WBC: 4 10*3/uL (ref 4.0–10.5)

## 2015-01-13 LAB — GLUCOSE, CAPILLARY
GLUCOSE-CAPILLARY: 94 mg/dL (ref 65–99)
GLUCOSE-CAPILLARY: 107 mg/dL — AB (ref 65–99)

## 2015-01-13 SURGERY — EGD (ESOPHAGOGASTRODUODENOSCOPY)
Anesthesia: Monitor Anesthesia Care

## 2015-01-13 MED ORDER — TECHNETIUM TC 99M-LABELED RED BLOOD CELLS IV KIT
25.0000 | PACK | Freq: Once | INTRAVENOUS | Status: AC | PRN
Start: 2015-01-13 — End: 2015-01-13
  Administered 2015-01-13: 25 via INTRAVENOUS

## 2015-01-13 MED ORDER — MEPERIDINE HCL 100 MG/ML IJ SOLN: 6.2500 mg | INTRAMUSCULAR | Status: DC | PRN

## 2015-01-13 MED ORDER — FENTANYL CITRATE (PF) 100 MCG/2ML IJ SOLN: 25.0000 ug | INTRAMUSCULAR | Status: DC | PRN

## 2015-01-13 MED ORDER — BUTAMBEN-TETRACAINE-BENZOCAINE 2-2-14 % EX AERO
INHALATION_SPRAY | CUTANEOUS | Status: DC | PRN
Start: 2015-01-13 — End: 2015-01-13
  Administered 2015-01-13: 2 via TOPICAL

## 2015-01-13 MED ORDER — POLYETHYLENE GLYCOL 3350 17 G PO PACK
17.0000 g | PACK | Freq: Three times a day (TID) | ORAL | Status: DC
  Administered 2015-01-13 – 2015-01-15 (×5): 17 g via ORAL
  Filled 2015-01-13 (×6): qty 1

## 2015-01-13 MED ORDER — SODIUM CHLORIDE 0.9 % IV SOLN: INTRAVENOUS | Status: DC

## 2015-01-13 MED ORDER — PROPOFOL INFUSION 10 MG/ML OPTIME
INTRAVENOUS | Status: DC | PRN
  Administered 2015-01-13: 200 ug/kg/min via INTRAVENOUS

## 2015-01-13 NOTE — H&P (View-Only) (Signed)
Referring Provider: Dr. Beryle Beams Primary Care Physician:  No primary care provider on file. Primary Gastroenterologist:  Althia Forts  Reason for Consultation:  GI bleed  HPI: Jill Short is a 81 y.o. female who was recently in the hospital for a GI bleed and had a colonoscopy last week by Dr. Oletta Lamas that showed diffuse diverticulosis without any blood seen comes back in with episodes of black and red blood per rectum (pt unsure how many times or duration of the bleeding). Reports that she took Epson salts following recent discharge because of constipation. Denies history of peptic ulcer disease. Denies dizziness, nausea, vomiting, abdominal pain, chest pain, or shortness of breath. Denies NSAIDs. Hgb 8.1 on admit and now 7.2 (7.9 during last admit on 6/18). On Eliquis, Aspirin and Meloxicam prior to last admit but Eliquis and Meloxicam were held at discharge and continued on Aspirin 81 mg/day. Patient is from [place] and was passing through from [place] to visit her daughter here.  Past Medical History  Diagnosis Date  . Coronary artery disease   . Pacemaker     Past Surgical History  Procedure Laterality Date  . Colonoscopy N/A 01/06/2015    Procedure: COLONOSCOPY;  Surgeon: Laurence Spates, MD;  Location: [facility] ENDOSCOPY;  Service: Endoscopy;  Laterality: N/A;    Prior to Admission medications   Medication Sig Start Date End Date Taking? Authorizing Provider  aspirin EC 81 MG tablet Take 81 mg by mouth daily.   Yes Historical Provider, MD  bacitracin ointment Apply 1 application topically 2 (two) times daily.   Yes Historical Provider, MD  cholecalciferol (VITAMIN D) 1000 UNITS tablet Take 1,000 Units by mouth daily.   Yes Historical Provider, MD  Cyanocobalamin (B-12 PO) Take 1 tablet by mouth daily.   Yes Historical Provider, MD  fenofibrate (TRIGLIDE) 50 MG tablet Take 50 mg by mouth daily.   Yes Historical Provider, MD  ferrous sulfate 325 (65 FE) MG tablet Take 1 tablet (325 mg total)  by mouth daily with breakfast. 01/07/15  Yes Jones Bales, MD  Flaxseed, Linseed, (FLAX PO) Take 1,200 mg by mouth daily.   Yes Historical Provider, MD  metFORMIN (GLUCOPHAGE) 500 MG tablet Take 500 mg by mouth 2 (two) times daily with a meal.   Yes Historical Provider, MD  vitamin C (ASCORBIC ACID) 500 MG tablet Take 500 mg by mouth daily.   Yes Historical Provider, MD  pantoprazole (PROTONIX) 40 MG tablet Take 1 tablet (40 mg total) by mouth daily. 01/06/15   Jones Bales, MD  polyethylene glycol (MIRALAX / GLYCOLAX) packet Take 17 g by mouth 2 (two) times daily. 01/07/15   Jones Bales, MD    Scheduled Meds: . aspirin EC  81 mg Oral Daily  . fenofibrate  54 mg Oral Daily  . ferrous sulfate  325 mg Oral Q breakfast  . insulin aspart  0-9 Units Subcutaneous TID WC  . pantoprazole  40 mg Oral Daily  . sodium chloride  3 mL Intravenous Q12H   Continuous Infusions:  PRN Meds:.acetaminophen **OR** acetaminophen  Allergies as of 01/11/2015  . (No Known Allergies)    History reviewed. No pertinent family history.  History   Social History  . Marital Status: Married    Spouse Name: N/A  . Number of Children: N/A  . Years of Education: N/A   Occupational History  . Not on file.   Social History Main Topics  . Smoking status: Never Smoker   . Smokeless tobacco: Not on  file  . Alcohol Use: No  . Drug Use: No  . Sexual Activity: Not on file   Other Topics Concern  . Not on file   Social History Narrative    Review of Systems: All negative except as stated above in HPI.  Physical Exam: Vital signs: Filed Vitals:   01/12/15 1136  BP: 115/61  Pulse: 70  Temp: 97.9 F (36.6 C)  Resp: 20   Last BM Date: 01/11/15 General:   Alert,  Elderly, thin, pleasant and cooperative in NAD Head: atraumatic Eyes: pupils equal and reactive, anicteric sclera ENT: oropharynx clear Neck: supple, nontender Lungs:  Clear throughout to auscultation.   No wheezes, crackles, or  rhonchi. No acute distress. Heart:  Regular rate and rhythm; no murmurs, clicks, rubs,  or gallops. Abdomen: soft, nontender, nondistended, +BS Rectal:  Deferred Ext: no edema Skin: no rash Psych: normal mood, affect  GI:  Lab Results:  Recent Labs  01/11/15 1532 01/12/15 0604  WBC 7.6 4.3  HGB 8.1* 7.2*  HCT 25.1* 22.6*  PLT 494* 411*   BMET No results for input(s): NA, K, CL, CO2, GLUCOSE, BUN, CREATININE, CALCIUM in the last 72 hours. LFT No results for input(s): PROT, ALBUMIN, AST, ALT, ALKPHOS, BILITOT, BILIDIR, IBILI in the last 72 hours. PT/INR No results for input(s): LABPROT, INR in the last 72 hours.   Studies/Results: No results found.  Impression/Plan: 81 yo with GI bleed with melena and hematochezia. Doubt diverticular source. Needs EGD to evaluate for upper tract source and if unrevealing, then will need an inpt capsule endoscopy. Keep on clear liquid diet. NPO p MN. Change to IV PPI Q 12 hours. EGD tomorrow. Risks/benefits discussed with patient and she agrees to proceed.      [place] C.  01/12/2015, 1:08 PM  Pager [phone]  If no answer or after 5 PM call [phone]

## 2015-01-13 NOTE — Progress Notes (Signed)
Patient ID: Jill Short, female   DOB: [birth year], 81 y.o.   MRN: [id number] Medicine attending: I examined this patient this morning together with resident physician Dr. Albin Felling and I concur with her evaluation and management plan. We appreciate prompt GI and general surgery assistance. Surgeon called in as backup in case patient has progressive hemorrhage. No immediate plans for surgery. GI will do upper endoscopy today. If this is unrevealing, then follow-up with a capsule endoscopy to rule out other sites of potential bleeding. Patient had a bowel movement this morning with no blood. Hemoglobin stable at 9.2 g. Impression: #1. Recent lower GI bleed presumed secondary to extensive diverticulosis but no focal point of bleeding seen at time of colonoscopy. Plan: See above #2. History of atrial fibrillation holding anticoagulants in view of ongoing bleeding

## 2015-01-13 NOTE — Anesthesia Preprocedure Evaluation (Signed)
Anesthesia Evaluation  Patient identified by MRN, date of birth, ID band Patient awake    Reviewed: Allergy & Precautions, NPO status , Patient's Chart, lab work & pertinent test results  Airway Mallampati: I       Dental  (+) Edentulous Upper, Upper Dentures, Dental Advisory Given   Pulmonary  breath sounds clear to auscultation        Cardiovascular + CAD Rhythm:Regular Rate:Normal     Neuro/Psych    GI/Hepatic   Endo/Other  diabetes, Well Controlled, Type 2, Oral Hypoglycemic Agents  Renal/GU      Musculoskeletal   Abdominal   Peds  Hematology   Anesthesia Other Findings   Reproductive/Obstetrics                             Anesthesia Physical Anesthesia Plan  ASA: III  Anesthesia Plan: MAC   Post-op Pain Management:    Induction: Intravenous  Airway Management Planned: Simple Face Mask  Additional Equipment:   Intra-op Plan:   Post-operative Plan:   Informed Consent: I have reviewed the patients History and Physical, chart, labs and discussed the procedure including the risks, benefits and alternatives for the proposed anesthesia with the patient or authorized representative who has indicated his/her understanding and acceptance.   Dental advisory given  Plan Discussed with: CRNA, Anesthesiologist and Surgeon  Anesthesia Plan Comments:         Anesthesia Quick Evaluation

## 2015-01-13 NOTE — Interval H&P Note (Signed)
History and Physical Interval Note:  01/13/2015 1:54 PM  Jill Short  has presented today for surgery, with the diagnosis of GI bleed  The various methods of treatment have been discussed with the patient and family. After consideration of risks, benefits and other options for treatment, the patient has consented to  Procedure(s): ESOPHAGOGASTRODUODENOSCOPY (EGD) (N/A) as a surgical intervention .  The patient's history has been reviewed, patient examined, no change in status, stable for surgery.  I have reviewed the patient's chart and labs.  Questions were answered to the patient's satisfaction.     [name],Karna Abed L

## 2015-01-13 NOTE — Progress Notes (Signed)
Subjective: No acute events overnight. Patient is complaining of right shoulder pain this morning. She denies any blunt trauma to the area. She thinks she may have hurt it by pulling herself up. She states the pain was better after Tylenol.   Her Hbg is stable at 9.2. She denies any abdominal pain. She is to undergo endoscopy this afternoon.   Objective: Vital signs in last 24 hours: Filed Vitals:   01/12/15 1419 01/12/15 1714 01/12/15 2100 01/13/15 0500  BP: 138/81 125/73 138/60 134/68  Pulse: 69 70 66 69  Temp: 98.9 F (37.2 C) 97.7 F (36.5 C) 98.8 F (37.1 C) 97.7 F (36.5 C)  TempSrc: Oral Oral Oral Oral  Resp: 16 17 18 18   Height:      Weight:   134 lb 1.6 oz (60.827 kg)   SpO2: 100% 98% 97% 100%   Weight change: 1.6 oz (0.045 kg)  Intake/Output Summary (Last 24 hours) at 01/13/15 0826 Last data filed at 01/13/15 0810  Gross per 24 hour  Intake   1995 ml  Output   4425 ml  Net  -2430 ml   Physical Exam General: sitting up in bed, NAD HEENT: [place]/AT, EOMI, sclera anicteric, mucus membranes moist CV: RRR, 2/6 systolic murmur Pulm: CTA bilaterally, breaths non-labored Abd: BS+, soft, non-tender Ext: warm, 1+ pitting edema. Bilateral lower extremities appear symmetric, no tenderness to palpation.  Neuro: alert and oriented x 3. No focal deficits.   Lab Results: Basic Metabolic Panel:  Recent Labs Lab 01/06/15 0851  NA 141  K 3.7  CL 109  CO2 27  GLUCOSE 106*  BUN <5*  CREATININE 0.72  CALCIUM 8.8*   CBC:  Recent Labs Lab 01/12/15 2023 01/13/15 0537  WBC 6.0 4.0  HGB 9.4* 9.2*  HCT 28.9* 28.3*  MCV 93.8 94.0  PLT 455* 426*   CBG:  Recent Labs Lab 01/12/15 0749 01/12/15 0858 01/12/15 1158 01/12/15 1649 01/12/15 2122 01/12/15 2318  GLUCAP 67 154* 76 77 69 126*   Medications: I have reviewed the patient's current medications. Scheduled Meds: . aspirin EC  81 mg Oral Daily  . fenofibrate  54 mg Oral Daily  . ferrous sulfate  325 mg  Oral Q breakfast  . insulin aspart  0-9 Units Subcutaneous TID WC  . pantoprazole (PROTONIX) IV  40 mg Intravenous Q12H  . sodium chloride  3 mL Intravenous Q12H   Continuous Infusions:  PRN Meds:.acetaminophen **OR** acetaminophen Assessment/Plan:  GI Bleeding in Setting of Severe Diverticulosis: Hbg stable at 9.2 after transfusion of 1 unit of blood. Nurse reported patient had a total of 3 dark bowel movements since hospitalization. GI doubts a diverticular bleed as the source and are going to perform endoscopy today to look for an upper GI source. If endoscopy negative then may have to proceed with capsule study.   - GI following, appreciate recommendations - General Surgery following, appreciate recommendations - Will start IVF if BPs become soft  - CBC in AM - NPO for now, clears after endoscopy - Continue iron supplementation - Continue Protonix 40 mg IV BID  - Cardiac monitoring  Lower Extremity Edema: Patient with bilateral 2+ pitting edema and with LLE greater than the right on admission. Legs appear more symmetric this morning. No tenderness to palpation of her lower extremities. Dopplers were negative for DVT. Will continue to monitor.   Type 2 DM: Patient takes Metformin 500 mg daily at home. CBGs have been on lower side, 70s. Recommended for nurse  to give amp of D50 if below 70 since she is NPO for endoscopy.  - Hold Metformin - Continue sensitive ISS - CBGs 4 times daily   HTN: BPs stable in the 388-828M systolic. Patient takes Lopressor 25 mg QHS and Ramipril 10 mg daily at home, but these were held on her last hospital discharge due to soft BPs and her GI bleeding.  - Continue to hold given GI bleeding   Atrial Fibrillation with Pacemaker: Patient normally on Eliquis at home for her AFib but this was held upon discharge on 6/18 due to her GI bleeding. She was supposed to resume Eliquis on 6/25 but she has had continued GI bleeding.  - Hold Eliquis - Continue ASA 81  mg daily   Hyperlipidemia: Patient takes Fenofibrate 50 mg daily at home. - Continue Fenofibrate   Diet: NPO until endoscopy, clears after VTE PPx: SCDs Dispo: Disposition is deferred at this time, awaiting improvement of current medical problems.  Anticipated discharge in approximately 1-2 day(s).   The patient does have a current PCP (No primary care provider on file.) and does need an [facility] hospital follow-up appointment after discharge.  The patient does not have transportation limitations that hinder transportation to clinic appointments.  .Services Needed at time of discharge: Y = Yes, Blank = No PT:   OT:   RN:   Equipment:   Other:       Juliet Rude, MD 01/13/2015, 8:26 AM

## 2015-01-13 NOTE — Transfer of Care (Signed)
Immediate Anesthesia Transfer of Care Note  Patient: Jill Short  Procedure(s) Performed: Procedure(s): ESOPHAGOGASTRODUODENOSCOPY (EGD) (N/A)  Patient Location: Endoscopy Unit  Anesthesia Type:MAC  Level of Consciousness: sedated and responds to stimulation  Airway & Oxygen Therapy: Patient Spontanous Breathing and Patient connected to nasal cannula oxygen  Post-op Assessment: Report given to RN, Post -op Vital signs reviewed and stable and Patient moving all extremities  Post vital signs: Reviewed and stable  Last Vitals:  Filed Vitals:   01/13/15 1318  BP: 133/93  Pulse: 93  Temp: 36.8 C  Resp: 16    Complications: No apparent anesthesia complications

## 2015-01-13 NOTE — Op Note (Signed)
[facility] [place], [id number]   ENDOSCOPY PROCEDURE REPORT  PATIENT: Jill, Short  MR#: [id number] BIRTHDATE: 1930/08/11 , 82  yrs. old GENDER: female ENDOSCOPIST:Brysun Eschmann Oletta Lamas, MD REFERRED BY: Murriel Hopper, M.D. PROCEDURE DATE:  01/13/2015 PROCEDURE:   EGD, diagnostic ASA CLASS:    Class III INDICATIONS: patient admitted with what was felt to be G.I.  bleed due to diverticulosis.  Went home with no bowel movement for 3 days and then new onset of bright red blood again.  Felt to be diverticular but EGD performed just to make sure that not an ulcer or some upper G.I.  source. MEDICATION: Propofol 80 mg IV TOPICAL ANESTHETIC:   Cetacaine Spray  DESCRIPTION OF PROCEDURE:   After the risks and benefits of the procedure were explained, informed consent was obtained.  The PENTAX GASTOROSCOPE [id number]  endoscope was introduced through the mouth  and advanced to the second portion of the duodenum .  The instrument was slowly withdrawn as the mucosa was fully examined. Estimated blood loss is zero unless otherwise noted in this procedure report.      ESOPHAGUS: Hiatal hernia, widely patent GE junction no esophagitis or other source of G.I.  bleeding.  STOMACH: No ulceration or gastritis.  No signs of active or recent bleeding.  DUODENUM: No ulceration or inflammation.  No signs of active recent bleeding.    Retroflexed views revealed no abnormalities.    The scope was then withdrawn from the patient and the procedure completed.  COMPLICATIONS: There were no immediate complications.  ENDOSCOPIC IMPRESSION: 1.   Hiatal hernia, widely patent GE junction no esophagitis or other source of G.I.  bleeding 2.   No ulceration or gastritis.  No signs of active or recent bleeding 3.   No ulceration or inflammation.  No signs of active recent bleeding 4.   No source of active G.I. bleeding seen in the upper G.I. tract. Suspected her  pleadings from recurrent diverticular bleeding RECOMMENDATIONS: Will go ahead and water G.I.  bleeding scan and keep the patient on Miralax and clear liquids at the current time.   _______________________________ Lorrin MaisLaurence Spates, MD 01/13/2015 2:41 PM     cc: Murriel Hopper, MD  CPT CODES: ICD CODES:  The ICD and CPT codes recommended by this software are interpretations from the data that the clinical staff has captured with the software.  The verification of the translation of this report to the ICD and CPT codes and modifiers is the sole responsibility of the health care institution and practicing physician where this report was generated.  [employer] will not be held responsible for the validity of the ICD and CPT codes included on this report.  AMA assumes no liability for data contained or not contained herein. CPT is a Designer, television/film set of the [employer].  PATIENT NAME:  Jill, Short MR#: [id number]

## 2015-01-13 NOTE — Progress Notes (Signed)
Patient ID: Jill Short, female   DOB: 1931-07-08, 81 y.o.   MRN: [id number]  Subjective: No c/o. Denies further melena or BRBPR. Denies pain.   Objective: Vital signs in last 24 hours: Temp:  [97.7 F (36.5 C)-98.9 F (37.2 C)] 97.7 F (36.5 C) (06/24 0500) Pulse Rate:  [66-70] 69 (06/24 0500) Resp:  [16-18] 18 (06/24 0500) BP: (125-138)/(60-81) 134/68 mmHg (06/24 0500) SpO2:  [97 %-100 %] 100 % (06/24 0500) Weight:  [60.827 kg (134 lb 1.6 oz)] 60.827 kg (134 lb 1.6 oz) (06/23 2100) Last BM Date: 01/12/15   Laboratory  CBC  Recent Labs  01/12/15 2023 01/13/15 0537  WBC 6.0 4.0  HGB 9.4* 9.2*  HCT 28.9* 28.3*  PLT 455* 426*    Physical Exam General appearance: alert and no distress Resp: clear to auscultation bilaterally Cardio: irregularly irregular rhythm GI: normal findings: bowel sounds normal and soft, non-tender   Assessment/Plan: Hematochezia -- Hgb stable this morning. Noted plans for upper and then possible capsule endoscopy. Will continue to follow. Will see patient as needed over the weekend, please call if we can be of help.   Lisette Abu, PA-C Pager: [phone] 01/13/2015

## 2015-01-14 ENCOUNTER — Observation Stay (HOSPITAL_COMMUNITY): Payer: Medicare Other

## 2015-01-14 DIAGNOSIS — I2510 Atherosclerotic heart disease of native coronary artery without angina pectoris: Secondary | ICD-10-CM | POA: Diagnosis not present

## 2015-01-14 DIAGNOSIS — E119 Type 2 diabetes mellitus without complications: Secondary | ICD-10-CM | POA: Diagnosis not present

## 2015-01-14 DIAGNOSIS — M25511 Pain in right shoulder: Secondary | ICD-10-CM | POA: Diagnosis not present

## 2015-01-14 DIAGNOSIS — K5731 Diverticulosis of large intestine without perforation or abscess with bleeding: Secondary | ICD-10-CM | POA: Diagnosis not present

## 2015-01-14 DIAGNOSIS — I10 Essential (primary) hypertension: Secondary | ICD-10-CM | POA: Diagnosis not present

## 2015-01-14 DIAGNOSIS — I4891 Unspecified atrial fibrillation: Secondary | ICD-10-CM | POA: Diagnosis not present

## 2015-01-14 DIAGNOSIS — E785 Hyperlipidemia, unspecified: Secondary | ICD-10-CM | POA: Diagnosis not present

## 2015-01-14 DIAGNOSIS — K922 Gastrointestinal hemorrhage, unspecified: Secondary | ICD-10-CM | POA: Diagnosis not present

## 2015-01-14 LAB — GLUCOSE, CAPILLARY
GLUCOSE-CAPILLARY: 83 mg/dL (ref 65–99)
GLUCOSE-CAPILLARY: 68 mg/dL (ref 65–99)
Glucose-Capillary: 99 mg/dL (ref 65–99)
Glucose-Capillary: 76 mg/dL (ref 65–99)
Glucose-Capillary: 84 mg/dL (ref 65–99)

## 2015-01-14 LAB — CBC
HCT: 28.8 % — ABNORMAL LOW (ref 36.0–46.0)
HEMOGLOBIN: 9.3 g/dL — AB (ref 12.0–15.0)
MCH: 30.7 pg (ref 26.0–34.0)
MCHC: 32.3 g/dL (ref 30.0–36.0)
MCV: 95 fL (ref 78.0–100.0)
Platelets: 427 10*3/uL — ABNORMAL HIGH (ref 150–400)
RBC: 3.03 MIL/uL — ABNORMAL LOW (ref 3.87–5.11)
RDW: 16.3 % — ABNORMAL HIGH (ref 11.5–15.5)
WBC: 5.2 10*3/uL (ref 4.0–10.5)

## 2015-01-14 MED ORDER — VITAMIN B-12 1000 MCG PO TABS
1000.0000 ug | ORAL_TABLET | Freq: Every day | ORAL | Status: DC
  Administered 2015-01-14 – 2015-01-15 (×2): 1000 ug via ORAL
  Filled 2015-01-14 (×2): qty 1

## 2015-01-14 MED ORDER — ACETAMINOPHEN 650 MG RE SUPP: 650.0000 mg | Freq: Four times a day (QID) | RECTAL | Status: DC | PRN

## 2015-01-14 MED ORDER — ACETAMINOPHEN 650 MG RE SUPP: 650.0000 mg | RECTAL | Status: DC

## 2015-01-14 MED ORDER — CALCIUM CITRATE-VITAMIN D 500-400 MG-UNIT PO CHEW: 1.0000 | CHEWABLE_TABLET | Freq: Two times a day (BID) | ORAL | Status: DC

## 2015-01-14 MED ORDER — ACETAMINOPHEN 325 MG PO TABS
650.0000 mg | ORAL_TABLET | ORAL | Status: DC
  Administered 2015-01-14 (×3): 650 mg via ORAL
  Filled 2015-01-14 (×3): qty 2

## 2015-01-14 MED ORDER — ACETAMINOPHEN 325 MG PO TABS
650.0000 mg | ORAL_TABLET | Freq: Four times a day (QID) | ORAL | Status: DC | PRN
  Administered 2015-01-14: 650 mg via ORAL
  Filled 2015-01-14: qty 2

## 2015-01-14 MED ORDER — SODIUM CHLORIDE 0.9 % IV SOLN
510.0000 mg | Freq: Once | INTRAVENOUS | Status: AC
  Administered 2015-01-14: 510 mg via INTRAVENOUS
  Filled 2015-01-14: qty 17

## 2015-01-14 MED ORDER — CALCIUM CARBONATE-VITAMIN D 500-200 MG-UNIT PO TABS
1.0000 | ORAL_TABLET | Freq: Two times a day (BID) | ORAL | Status: DC
  Administered 2015-01-14 – 2015-01-15 (×2): 1 via ORAL
  Filled 2015-01-14 (×3): qty 1

## 2015-01-14 MED ORDER — PANTOPRAZOLE SODIUM 40 MG PO TBEC
40.0000 mg | DELAYED_RELEASE_TABLET | Freq: Every day | ORAL | Status: DC
  Administered 2015-01-15: 40 mg via ORAL
  Filled 2015-01-14: qty 1

## 2015-01-14 NOTE — Progress Notes (Addendum)
Subjective: No acute events overnight.  Endoscopy revealed no source of bleeding. GI then ordered a tagged RBC scan which showed no evidence of active bleeding as well. Her Hbg is stable at 9.3.   Patient states she had a bowel movement yesterday that was dark, but no bright red blood. She denies abdominal pain, chest pain, dyspnea.   Objective: Vital signs in last 24 hours: Filed Vitals:   01/13/15 1450 01/13/15 1814 01/13/15 2100 01/14/15 0500  BP: 140/71 139/62 114/60 130/70  Pulse:  70 84 87  Temp:  98.1 F (36.7 C) 98.4 F (36.9 C) 98 F (36.7 C)  TempSrc:   Oral Oral  Resp: 20 20 18 16   Height:      Weight:   133 lb 11.2 oz (60.646 kg)   SpO2: 97% 100% 97% 97%   Weight change: -6.4 oz (-0.181 kg)  Intake/Output Summary (Last 24 hours) at 01/14/15 0839 Last data filed at 01/14/15 0758  Gross per 24 hour  Intake    360 ml  Output   2600 ml  Net  -2240 ml   Physical Exam General: elderly woman sitting up in bed, NAD HEENT: [place]/AT, EOMI, sclera anicteric, mucus membranes moist CV: RRR, 2/6 systolic murmur Pulm: CTA bilaterally, breaths non-labored Abd: BS+, soft, non-tender Ext: warm, no edema, ROM limited in RUE  Neuro: alert and oriented x 3. Strength 4+/5 in RUE secondary to pain. Strength 5/5 in other extremities.   Lab Results:  CBC:  Recent Labs Lab 01/13/15 0537 01/14/15 0320  WBC 4.0 5.2  HGB 9.2* 9.3*  HCT 28.3* 28.8*  MCV 94.0 95.0  PLT 426* 427*   CBG:  Recent Labs Lab 01/12/15 2122 01/12/15 2318 01/13/15 0811 01/13/15 1152 01/13/15 2123 01/14/15 0801  GLUCAP 69 126* 94 107* 99 83   Studies/Results: Nm Gi Blood Loss  01/13/2015   CLINICAL DATA:  GI bleeding per rectum.  EXAM: NUCLEAR MEDICINE GASTROINTESTINAL BLEEDING SCAN  TECHNIQUE: Sequential abdominal images were obtained following intravenous administration of Tc-24m labeled red blood cells.  RADIOPHARMACEUTICALS:  25.0 mCi Tc-42m in-vitro labeled red cells.  COMPARISON:  None.   FINDINGS: Sequential planar images were obtained over the abdomen/ pelvis up to 2 hours. Examination demonstrates normal visualization of the aorta and iliac vessels. There is no focal abnormal migrating radiotracer activity to suggest GI bleed. Normal radiotracer accumulation over the bladder on the later images.  IMPRESSION: No evidence of GI bleed.   Electronically Signed   By: Marin Olp M.D.   On: 01/13/2015 19:01   Medications: I have reviewed the patient's current medications. Scheduled Meds: . aspirin EC  81 mg Oral Daily  . fenofibrate  54 mg Oral Daily  . ferrous sulfate  325 mg Oral Q breakfast  . insulin aspart  0-9 Units Subcutaneous TID WC  . pantoprazole (PROTONIX) IV  40 mg Intravenous Q12H  . polyethylene glycol  17 g Oral TID  . sodium chloride  3 mL Intravenous Q12H   Continuous Infusions:  PRN Meds:.acetaminophen **OR** acetaminophen Assessment/Plan:  GI Bleeding in Setting of Severe Diverticulosis: Hbg stable at 9.3. Endoscopy and tagged RBC scan revealed no source of active bleeding. Patient had a dark stool yesterday, but no bright red blood. Bleeding seems to have stabilized. Will await GI's recommendations about whether a capsule study is indicated or if conservative management will be pursued.  - GI following, appreciate recommendations - General Surgery following, appreciate recommendations - CBC in AM - Clears - Continue  iron supplementation. Will give 1 dose feraheme.  - Continue Protonix 40 mg IV BID  - Continue Miralax TID - Cardiac monitoring  Right Shoulder Pain: Patient had noted right shoulder pain yesterday. She denied any trauma to the area. She has decreased ROM in her right arm, only able to lift 90 degrees due to pain. Will get x-ray to evaluate shoulder and have PT work with her. - x-ray right shoulder - PT consulted - Tylenol for pain  Lower Extremity Edema: Edema has resolved. Dopplers negative for DVT.   Type 2 DM: Patient takes  Metformin 500 mg daily at home. CBGs well controlled.  - Hold Metformin - Continue sensitive ISS - CBGs 4 times daily   HTN: BPs stable in the 175-102H systolic. Patient takes Lopressor 25 mg QHS and Ramipril 10 mg daily at home, but these were held on her last hospital discharge due to soft BPs and her GI bleeding.  - Continue to hold given GI bleeding   Atrial Fibrillation with Pacemaker: Patient normally on Eliquis at home for her AFib but this was held upon discharge on 6/18 due to her GI bleeding. She was supposed to resume Eliquis on 6/25 but she has had continued GI bleeding.  - Hold Eliquis - Continue ASA 81 mg daily   Hyperlipidemia: Patient takes Fenofibrate 50 mg daily at home. - Continue Fenofibrate    Diet: Clears  VTE PPx: SCDs Dispo: Expected discharge in next 1-2 days.   Updated patient's daughter by phone.   The patient does have a current PCP (No primary care provider on file.) and does need an [facility] hospital follow-up appointment after discharge.  The patient does not have transportation limitations that hinder transportation to clinic appointments.  .Services Needed at time of discharge: Y = Yes, Blank = No PT:   OT:   RN:   Equipment:   Other:       Juliet Rude, MD 01/14/2015, 8:39 AM

## 2015-01-14 NOTE — Progress Notes (Signed)
[facility] PROGRESS NOTE Subjective G.I. bleeding scan negative for active bleeding. No further passage of blood or further bowel movements. Hemoglobin stable.  Objective: Vital signs in last 24 hours: Temp:  [98 F (36.7 C)-98.4 F (36.9 C)] 98 F (36.7 C) (06/25 1000) Pulse Rate:  [70-96] 88 (06/25 1000) Resp:  [16-20] 18 (06/25 1000) BP: (114-167)/(60-95) 115/87 mmHg (06/25 1000) SpO2:  [97 %-100 %] 99 % (06/25 1000) Weight:  [60.646 kg (133 lb 11.2 oz)] 60.646 kg (133 lb 11.2 oz) (06/24 2100) Last BM Date: 01/13/15  Intake/Output from previous day: 06/24 0701 - 06/25 0700 In: 360 [P.O.:360] Out: 2650 [Urine:2650] Intake/Output this shift: Total I/O In: 240 [P.O.:240] Out: 400 [Urine:400]  PE: General-- alert no distress  Abdomen-- nontender  Lab Results:  Recent Labs  01/11/15 1532 01/12/15 0604 01/12/15 2023 01/13/15 0537 01/14/15 0320  WBC 7.6 4.3 6.0 4.0 5.2  HGB 8.1* 7.2* 9.4* 9.2* 9.3*  HCT 25.1* 22.6* 28.9* 28.3* 28.8*  PLT 494* 411* 455* 426* 427*   BMET No results for input(s): NA, K, CL, CO2, CREATININE in the last 72 hours. LFT No results for input(s): PROT, AST, ALT, ALKPHOS, BILITOT, BILIDIR, IBILI in the last 72 hours. PT/INR No results for input(s): LABPROT, INR in the last 72 hours. PANCREAS No results for input(s): LIPASE in the last 72 hours.       Studies/Results: Nm Gi Blood Loss  01/13/2015   CLINICAL DATA:  GI bleeding per rectum.  EXAM: NUCLEAR MEDICINE GASTROINTESTINAL BLEEDING SCAN  TECHNIQUE: Sequential abdominal images were obtained following intravenous administration of Tc-78m labeled red blood cells.  RADIOPHARMACEUTICALS:  25.0 mCi Tc-28m in-vitro labeled red cells.  COMPARISON:  None.  FINDINGS: Sequential planar images were obtained over the abdomen/ pelvis up to 2 hours. Examination demonstrates normal visualization of the aorta and iliac vessels. There is no focal abnormal migrating radiotracer activity to  suggest GI bleed. Normal radiotracer accumulation over the bladder on the later images.  IMPRESSION: No evidence of GI bleed.   Electronically Signed   By: Marin Olp M.D.   On: 01/13/2015 19:01    Medications: I have reviewed the patient's current medications.  Assessment/Plan: 1. Lower G.I. bleed. Appears to have stopped again. This is all consistent with the diverticular bleed. Long discussion counseling patient about need to keep her stool soft. Told her that we can continue to try Miralax and have her adjust the dose order to obtain a soft bowel movement on a regular consistent basis. If told her that if she is unable to do this on a consistent basis and continues to bleed she may need a subtotal colectomy or invasive radiology procedure. We'll go ahead and advance her diet and see how she does.   Patrena Santalucia JR,Kijana Cromie L 01/14/2015, 10:33 AM  Pager: [phone] If no answer or after hours call [phone]

## 2015-01-14 NOTE — Progress Notes (Signed)
Hypoglycemic Event  CBG: Results for Jill Short, Jill Short (MRN [id number]) as of 01/14/2015 21:47  Ref. Range 01/14/2015 21:31  Glucose-Capillary Latest Ref Range: 65-99 mg/dL 68    Treatment: 15 GM carbohydrate snack  Symptoms: None  Follow-up CBG: Time: CBG Result: Results for Jill Short, Jill Short (MRN [id number]) as of 01/15/2015 01:11  Ref. Range 01/14/2015 22:38  Glucose-Capillary Latest Ref Range: 65-99 mg/dL 105 (H)    Possible Reasons for Event: Inadequate meal intake  Comments/MD notified:    Viviano Simas  Remember to initiate Hypoglycemia Order Set & complete

## 2015-01-15 DIAGNOSIS — K922 Gastrointestinal hemorrhage, unspecified: Secondary | ICD-10-CM | POA: Diagnosis not present

## 2015-01-15 DIAGNOSIS — K5731 Diverticulosis of large intestine without perforation or abscess with bleeding: Secondary | ICD-10-CM | POA: Diagnosis not present

## 2015-01-15 DIAGNOSIS — E785 Hyperlipidemia, unspecified: Secondary | ICD-10-CM | POA: Diagnosis not present

## 2015-01-15 DIAGNOSIS — I10 Essential (primary) hypertension: Secondary | ICD-10-CM | POA: Diagnosis not present

## 2015-01-15 DIAGNOSIS — I2510 Atherosclerotic heart disease of native coronary artery without angina pectoris: Secondary | ICD-10-CM | POA: Diagnosis not present

## 2015-01-15 DIAGNOSIS — I4891 Unspecified atrial fibrillation: Secondary | ICD-10-CM | POA: Diagnosis not present

## 2015-01-15 DIAGNOSIS — E119 Type 2 diabetes mellitus without complications: Secondary | ICD-10-CM | POA: Diagnosis not present

## 2015-01-15 LAB — CBC
HEMATOCRIT: 28.2 % — AB (ref 36.0–46.0)
Hemoglobin: 9.1 g/dL — ABNORMAL LOW (ref 12.0–15.0)
MCH: 30.6 pg (ref 26.0–34.0)
MCHC: 32.3 g/dL (ref 30.0–36.0)
MCV: 94.9 fL (ref 78.0–100.0)
Platelets: 415 10*3/uL — ABNORMAL HIGH (ref 150–400)
RBC: 2.97 MIL/uL — ABNORMAL LOW (ref 3.87–5.11)
RDW: 16.2 % — ABNORMAL HIGH (ref 11.5–15.5)
WBC: 4.1 10*3/uL (ref 4.0–10.5)

## 2015-01-15 LAB — GLUCOSE, CAPILLARY
GLUCOSE-CAPILLARY: 105 mg/dL — AB (ref 65–99)
Glucose-Capillary: 81 mg/dL (ref 65–99)
Glucose-Capillary: 91 mg/dL (ref 65–99)

## 2015-01-15 MED ORDER — POLYETHYLENE GLYCOL 3350 17 G PO PACK: PACK | ORAL | Status: DC

## 2015-01-15 MED ORDER — CALCIUM CARBONATE-VITAMIN D 500-200 MG-UNIT PO TABS: 1.0000 | ORAL_TABLET | Freq: Two times a day (BID) | ORAL | Status: DC

## 2015-01-15 NOTE — Progress Notes (Signed)
[facility] PROGRESS NOTE Subjective patient reports that she has had Brown loose stool with no blood in his having no abdominal pain  Objective: Vital signs in last 24 hours: Temp:  [97.6 F (36.4 C)-98 F (36.7 C)] 97.6 F (36.4 C) (06/26 0450) Pulse Rate:  [69-88] 69 (06/26 0450) Resp:  [17-18] 17 (06/26 0450) BP: (115-125)/(68-87) 125/68 mmHg (06/26 0450) SpO2:  [96 %-99 %] 98 % (06/26 0450) Last BM Date: 01/14/15  Intake/Output from previous day: 06/25 0701 - 06/26 0700 In: 840 [P.O.:840] Out: 2150 [Urine:2150] Intake/Output this shift:    PE: General-- alert no acute distress  Abdomen-- soft nontender  Lab Results:  Recent Labs  01/12/15 2023 01/13/15 0537 01/14/15 0320 01/15/15 0351  WBC 6.0 4.0 5.2 4.1  HGB 9.4* 9.2* 9.3* 9.1*  HCT 28.9* 28.3* 28.8* 28.2*  PLT 455* 426* 427* 415*   BMET No results for input(s): NA, K, CL, CO2, CREATININE in the last 72 hours. LFT No results for input(s): PROT, AST, ALT, ALKPHOS, BILITOT, BILIDIR, IBILI in the last 72 hours. PT/INR No results for input(s): LABPROT, INR in the last 72 hours. PANCREAS No results for input(s): LIPASE in the last 72 hours.       Studies/Results: Dg Shoulder Right  01/14/2015   CLINICAL DATA:  Acute right shoulder pain for 2 days. Patient has been pulling herself of in the bed. They have injured shoulder. No previous injury or surgery.  EXAM: RIGHT SHOULDER - 2+ VIEW  COMPARISON:  None.  FINDINGS: Bones appear osteopenic. There is subacromial narrowing which can be associated with rotator cuff injury. No evidence for acute fracture or subluxation. Visualized portion of the right lung apex is unremarkable.  IMPRESSION: 1.  No evidence for acute  abnormality. 2. Subacromial narrowing.   Electronically Signed   By: Nolon Nations M.D.   On: 01/14/2015 11:26   Nm Gi Blood Loss  01/13/2015   CLINICAL DATA:  GI bleeding per rectum.  EXAM: NUCLEAR MEDICINE GASTROINTESTINAL BLEEDING  SCAN  TECHNIQUE: Sequential abdominal images were obtained following intravenous administration of Tc-60m labeled red blood cells.  RADIOPHARMACEUTICALS:  25.0 mCi Tc-78m in-vitro labeled red cells.  COMPARISON:  None.  FINDINGS: Sequential planar images were obtained over the abdomen/ pelvis up to 2 hours. Examination demonstrates normal visualization of the aorta and iliac vessels. There is no focal abnormal migrating radiotracer activity to suggest GI bleed. Normal radiotracer accumulation over the bladder on the later images.  IMPRESSION: No evidence of GI bleed.   Electronically Signed   By: Marin Olp M.D.   On: 01/13/2015 19:01    Medications: I have reviewed the patient's current medications.  Assessment/Plan: 1. Lower G.I. bleed. Probably diverticular appears to have resolved another stools are loose. If counsel the patient extensively about the fact that she does need to be on Miralax. Told her she needs to adjust the dose on a regular basis to prophylactically keep herself from being constipated. Explained to her that this is a powder and she can adjust the dose easily either more or less in order to obtain soft bowel movements on a regular basis. Her hemoglobin stable and I think she probably could be discharged and followed his outpatient   Azariyah Luhrs JR,Cailah Reach L 01/15/2015, 7:21 AM  Pager: [phone] If no answer or after hours call [phone]

## 2015-01-15 NOTE — Discharge Summary (Signed)
Name: Jill Short MRN: [id number] DOB: 02-05-1931 81 y.o. PCP: No primary care provider on file. ______________________________________________________________  Date of Admission: 01/11/2015  4:59 PM Date of Discharge: 01/28/2015 Attending Physician: No att. providers found   Discharge Diagnosis: Patient Active Problem List   Diagnosis Date Noted  . Pacemaker 01/02/2015  . DM (diabetes mellitus) 01/02/2015  . Atrial fibrillation 01/02/2015  . Anticoagulated by anticoagulation treatment 01/02/2015  . Diverticulosis of colon with hemorrhage 01/02/2015  . GI bleed 01/01/2015     Discharge Medications:   Medication List    STOP taking these medications        polyethylene glycol packet  Commonly known as:  MIRALAX / GLYCOLAX      TAKE these medications        aspirin EC 81 MG tablet  Take 81 mg by mouth daily.     B-12 PO  Take 1 tablet by mouth daily.     bacitracin ointment  Apply 1 application topically 2 (two) times daily.     calcium-vitamin D 500-200 MG-UNIT per tablet  Commonly known as:  OSCAL WITH D  Take 1 tablet by mouth 2 (two) times daily.     cholecalciferol 1000 UNITS tablet  Commonly known as:  VITAMIN D  Take 1,000 Units by mouth daily.     fenofibrate 50 MG tablet  Commonly known as:  TRIGLIDE  Take 50 mg by mouth daily.     ferrous sulfate 325 (65 FE) MG tablet  Take 1 tablet (325 mg total) by mouth daily with breakfast.     FLAX PO  Take 1,200 mg by mouth daily.     metFORMIN 500 MG tablet  Commonly known as:  GLUCOPHAGE  Take 500 mg by mouth 2 (two) times daily with a meal.     pantoprazole 40 MG tablet  Commonly known as:  PROTONIX  Take 1 tablet (40 mg total) by mouth daily.     vitamin C 500 MG tablet  Commonly known as:  ASCORBIC ACID  Take 500 mg by mouth daily.        Disposition and follow-up:   [name] was discharged from [facility] in Good condition to home.  Please address the  following problems post-discharge: GI Bleeding due to diverticulosis- Has pt had any additional bleeding? Check CBC.  HTN- Home meds held upon discharge due to GI bleeding/normotension. Check BP and determine if home meds should be restarted.  AFib- Eliquis held upon discharge. Should be held for 1 week and then resumed.    Labs / imaging needed at time of follow-up: CBC  Pending labs/ test needing follow-up: None  Follow-up Appointments: Follow-up Information    Follow up with [facility].   Why:  Internal Medicine Clinic will call you to schedule   Contact information:   [address] [place] [phone]      Discharge Instructions: Discharge Instructions    Call MD for:  extreme fatigue    Complete by:  As directed      Call MD for:  persistant dizziness or light-headedness    Complete by:  As directed      Call MD for:    Complete by:  As directed   Bleeding from the rectum or having blood in your stool or coughing up blood.     Diet - low sodium heart healthy    Complete by:  As directed  Discharge instructions    Complete by:  As directed   The internal medicine clinic will call you to schedule a follow-up appointment.     Increase activity slowly    Complete by:  As directed            Consultations: Treatment Team:  Md Ccs, MD Wilford Corner, MD  Procedures Performed:  Dg Shoulder Right  01/14/2015   CLINICAL DATA:  Acute right shoulder pain for 2 days. Patient has been pulling herself of in the bed. They have injured shoulder. No previous injury or surgery.  EXAM: RIGHT SHOULDER - 2+ VIEW  COMPARISON:  None.  FINDINGS: Bones appear osteopenic. There is subacromial narrowing which can be associated with rotator cuff injury. No evidence for acute fracture or subluxation. Visualized portion of the right lung apex is unremarkable.  IMPRESSION: 1.  No evidence for acute  abnormality. 2. Subacromial narrowing.    Electronically Signed   By: Nolon Nations M.D.   On: 01/14/2015 11:26   Nm Gi Blood Loss  01/13/2015   CLINICAL DATA:  GI bleeding per rectum.  EXAM: NUCLEAR MEDICINE GASTROINTESTINAL BLEEDING SCAN  TECHNIQUE: Sequential abdominal images were obtained following intravenous administration of Tc-24m labeled red blood cells.  RADIOPHARMACEUTICALS:  25.0 mCi Tc-45m in-vitro labeled red cells.  COMPARISON:  None.  FINDINGS: Sequential planar images were obtained over the abdomen/ pelvis up to 2 hours. Examination demonstrates normal visualization of the aorta and iliac vessels. There is no focal abnormal migrating radiotracer activity to suggest GI bleed. Normal radiotracer accumulation over the bladder on the later images.  IMPRESSION: No evidence of GI bleed.   Electronically Signed   By: Marin Olp M.D.   On: 01/13/2015 19:01    Admission HPI:  Pt is an 81 y/o F w/ PMHx of afib w/ pacemaker on eliquis, HTN, DM, and HLD who presents with bright red blood per rectum. She was recently admitted 6/12-20 for the same reason. GI performed a colonoscopy which showed severe diverticulosis with no active bleeding. GI recommended for her to continue Miralax indefinitely. Her home eliquis for her afib and anti HTN meds were held on discharge and pt confirms she has not been taking those medications. After discharge pt states she was constipated for 2-3 days. She called clinic and was given epsom salts after which she had 10 loose BMs. Stools were black and toilet bowel water was a dark blk color. Yesterday she had 2 BMs that were blk as well. Then today while in clinic she had a BM with melena and much bright red blood. That was the first time she has noticed blood in her stools since discharge. Pt denies SOB, weakness, fatigue, blurry vision, chest pain, dizziness, lightheadedness, and skin pallor. Also since discharge pt has had leg edema that has not gone done, with her left leg larger than the right. She has  been active and ambulatory since discharge. Denies leg pain or erythema.   Hospital Course by problem list: Principal Problem:   Diverticulosis of colon with hemorrhage Active Problems:   GI bleed   DM (diabetes mellitus)   Atrial fibrillation   GI Bleeding in Setting of Severe Diverticulosis: Patient presented with continued GI bleeding after being discharged from the hospital only 4 days prior for GI bleeding. This admission she presented with both melena and hematochezia. Her Hgb was 7.2 the following morning and she was transfused with 1 unit PRBCs. Her Hgb then remained stable in the 8-9 range.  She was also given feraheme x 1. GI was consulted and performed an endoscopy which did not show any abnormalities to explain her bleeding. General surgery was consulted as well in case a subtotal colectomy became necessary. A tagged RBC scan was negative for any active bleeding. By time of discharge, patient had not any dark or bloody stools for two days and Hgb remained stable. She was discharged on Protonix 40 mg daily, iron supplementation, Miralax 2-3 times daily, and recommended to hold Eliquis for 1 week. She has a follow up appointment in the IM clinic.   Right Shoulder Pain: Patient reported right shoulder pain during her hospitalization. She denied any trauma or falls. She was noted to have decreased ROM in her right arm, only able to lift her arm 90 degrees due to pain. X-ray of the shoulder showed no acute abnormalities. PT was consulted and worked with her prior to discharged to prevent adhesive capsulitis.   Type 2 DM: Patient's home Metformin was held during hospitalization. Her blood sugars remained controlled with a sensitive ISS. She was discharged on her home Metformin 500 mg daily.   HTN: BP remained normotensive during her hospitalization. Her home BP meds were held due to continued GI bleeding. Upon discharge, her BP meds were held as she remained normotensive. She will need a BP  recheck at her follow up visit and determination if BP meds need to be restarted.   Atrial Fibrillation with Pacemaker: Patient normally on Eliquis at home for her AFib but this was held upon discharge on 6/18 due to her GI bleeding. She was supposed to resume Eliquis on 6/25 but she has had continued GI bleeding. Her Eliquis remained held during hospitalization. She was recommended to hold Eliquis for 1 week at time of discharge. She has a follow up appointment in the IM clinic for which she will be instructed when to resume Eliquis.  Hyperlipidemia: Patient was continued on her home Fenofibrate 50 mg daily.    Discharge Vitals:   BP 143/76 mmHg  Pulse 69  Temp(Src) 98.4 F (36.9 C) (Oral)  Resp 18  Ht 5\' 6"  (1.676 m)  Wt 133 lb 11.2 oz (60.646 kg)  BMI 21.59 kg/m2  SpO2 98% Physical Exam General: elderly woman walking in the room, NAD HEENT: [place]/AT, EOMI, sclera anicteric, mucus membranes moist CV: RRR, systolic murmur Pulm: CTA bilaterally Abd: BS+, soft, non-tender Msk: R shoulder ROM limited d/t pain  Ext: warm, no edema, ROM limited in RUE  Neuro: alert and oriented x 3.   Discharge Labs:  No results found for this or any previous visit (from the past 24 hour(s)).  Signed: Juliet Rude, MD 01/28/2015, 12:15 AM   Services Ordered on Discharge: [facility] PT/OT/RN Equipment Ordered on Discharge: None

## 2015-01-15 NOTE — Progress Notes (Signed)
Discussed discharge instructions and medications with patient and daughter.  All questions answered.

## 2015-01-15 NOTE — Discharge Instructions (Signed)
Please keep your follow-up appointments; this is very important for your continued recovery.    We have made the following additions/changes to your medications:   Please refer to your medication list.  Please do not resume eliquis until you follow up with the internal medicine clinic this week.   The internal medicine clinic will call you this week to have your hemoglobin checked later this week.     Please continue to take all of your medications as prescribed.  Do not miss any doses without contacting your primary physician.  If you have questions, please contact your physician or contact the Internal Medicine Teaching Service at [phone].  Please bring your medicications with you to your appointments; medications may be eye drops, herbals, vitamins, or pills.    If you believe you are suffering from a life-threatening emergency, go to the nearest Emergency Department.      [place] Guide  1) Find a Charity fundraiser Although you won't have to find out who is covered by [employer] plan, it is a good idea to ask around and get recommendations. You will then need to call the office and see if the doctor you have chosen will accept you as a new patient and what types of options they offer for patients who are self-pay. Some doctors offer discounts or will set up payment plans for their patients who do not have insurance, but you will need to ask so you aren't surprised when you get to your appointment.  2) Contact Your Local Health Department Not all health departments have doctors that can see patients for sick visits, but many do, so it is worth a call to see if yours does. If you don't know where your local health department is, you can check in your phone book. The CDC also has a tool to help you locate your state's health department, and many state websites also have listings of all of their local health departments.  3) Find a [facility] If your illness  is not likely to be very severe or complicated, you may want to try a walk in clinic. These are popping up all over the country in pharmacies, drugstores, and shopping centers. They're usually staffed by nurse practitioners or physician assistants that have been trained to treat common illnesses and complaints. They're usually fairly quick and inexpensive. However, if you have serious medical issues or chronic medical problems, these are probably not your best option.  No Primary Care Doctor: - Call Health Connect at  [phone] - they can help you locate a primary care doctor that  accepts your insurance, provides certain services, etc. - Physician Referral Service- [phone]  Chronic Pain Problems: Organization         Address  Phone   Notes  [facility]  [phone] Patients need to be referred by their primary care doctor.   Medication Assistance: Organization         Address  Phone   Notes  [facility] Medication [facility] [address] [place], [place] [address] [phone] --Must be a resident of [facility] -- Must have NO insurance coverage whatsoever (no Medicaid/ Medicare, etc.) -- The pt. MUST have a primary care doctor that directs their care regularly and follows them in the community   MedAssist  [phone]   [employer]  [phone]    Agencies that provide inexpensive medical care: Organization  Address  Phone   Notes  [place]  [phone]   [facility]    [phone]   [facility] [place], [place] [address] [phone]   [place] [address] [place] [place] [phone]   Planned Parenthood    [phone]   [facility]    [phone]   [place] and [place] [address], [place] Phone:  [phone], Fax:  [phone] Hours of Operation:  9 am - 6  pm, M-F.  Also accepts Medicaid/Medicare and self-pay.  [facility] for [place], [address], [place] Phone: [phone], Fax: [phone]. Hours of Operation:  8:30 am - 5:30 pm, M-F.  Also accepts Medicaid and self-pay.  [facility] [place] [address], [place] Phone: [phone]   [place], [place], [place] [phone], Ext. [phone] Mondays & Thursdays: 7-9 AM.  First 15 patients are seen on a first come, first serve basis.    [place] Providers:  Organization         Address  Phone   Notes  [facility] [address], [place] [phone] Also accepts self-pay patients.  [facility] [address], [place]  [phone]   [place], [address], [place] [phone]   [facility] [address] [place] [phone]   Lucianne Lei [address], [place]   [phone] Only accepts [place] Access [place] patients after they have their name applied to their card.   Self-Pay (no insurance) in [facility]:  Organization         Address  Phone   Notes  Sickle Cell Patients, [facility] [place] [phone]   [facility] [place] [phone]   [facility] [place]  [place], [place],  [phone]   [facility]/Dr. Osei-Bonsu  [address], [place] or [address], [place] [phone] Phone number for both [place] and [place] locations is the same.  Urgent Medical and [facility] [address] [place] [phone]   [facility] [address] [place] or [address] [phone]   [facility] [address], [place] [phone], phone; [phone], fax Sees patients 1st and 3rd Saturday of every month.  Must not qualify for public or private insurance (i.e. Medicaid, Medicare, [place] Health Choice, Veterans' Benefits)  Household income should be no more than 200% of the poverty level The clinic cannot treat you if you are pregnant or think you are pregnant  Sexually transmitted diseases are not treated at the clinic.    Dental Care: Organization         Address  Phone  Notes  [facility] Department of [facility] [place] [phone] Accepts children up to age 47 who are enrolled in [place] or [place]; pregnant women with a Medicaid card; and children who have applied for Medicaid or [place] Health Choice, but were declined, whose parents can pay a reduced fee at time  of service.  [facility] Department of [facility]  [address], [place] [phone] Accepts children up to age 72 who are enrolled in [place] or [place]; pregnant women with a Medicaid card; and children who have applied for Medicaid or [place] Health Choice, but were declined, whose parents can pay a reduced fee at time of service.  [place] Adult Dental Access PROGRAM  Council [phone] Patients are seen by appointment only. Walk-ins are not accepted. [place] will see patients 44 years of age and older. Monday - Tuesday (8am-5pm) Most Wednesdays (8:30-5pm) $30 per visit, cash only  [facility] Adult Dental Access PROGRAM  [address], [facility] [phone] Patients are seen by appointment only. Walk-ins are not accepted. [place] will see patients 23 years of age and older. One Wednesday Evening (Monthly: Volunteer Based).  $30 per visit, cash only  [place]  [phone] for adults; Children under age 78, call Graduate Pediatric Dentistry at [phone]. Children aged 25-14, please call [phone] to request a pediatric application.  Dental services are provided in all areas of dental care including fillings, crowns and bridges, complete and partial dentures, implants, gum treatment, root canals, and extractions. Preventive care is also provided. Treatment is provided to both adults and children. Patients are selected via a lottery and there is often a waiting list.   [facility] [address] [place]  [phone] [email or web address]   [facility] [address] [place], [place] [phone], Ext. [phone] Second and Fourth Thursday of each month, opens at 6:30 AM; Clinic ends at 9 AM.  Patients are seen on a first-come first-served basis, and a limited number are seen during each clinic.   [facility]  [address] Hillard Danker [place], [place] [phone]   Eligibility Requirements You must have lived in [place], [place], or [place] counties for at least the last three months.   You cannot be eligible for state or federal sponsored [employer], including [employer], [place], or [employer].   You generally cannot be eligible for healthcare insurance through your employer.    How to apply: Eligibility screenings are held every Tuesday and Wednesday afternoon from 1:00 pm until 4:00 pm. You do not need an appointment for the interview!  [facility] [address], [place], [place]   [place]  [place] Department  [place]  [phone]    Behavioral Health Resources in the Community: Intensive Outpatient Programs Organization         Address  Phone  Notes  [place]. [address], [place], [place] [phone]   [facility] Outpatient [address] [place], [place]   ADS: Alcohol & Drug Svcs  [address] [place], [place]   [facility] [address]  [place], [place] or [phone]   Substance Abuse Resources Organization         Address  Phone  Notes  Alcohol and Drug Services  [phone]   [place]  [phone]   [facility] [place]   Chinita Pester  [phone]   Residential & Outpatient Substance Abuse Program  [phone]   Psychological Services Organization         Address  Phone  Notes  [facility] [place] [facility]  [place]  [phone]   [place] [address] [place] or [phone]    Mobile Crisis Teams Organization         Address  Phone  Notes  [facility]rapeutic Alternatives, Mobile Crisis Care Unit  [phone]   [facility] Psychotherapeutic Services  [address] [place], [place]   [place] [address], [place] [phone]    Self-Help/Support Groups Organization         Address  Phone             Notes  [place]. of [place] - variety of support groups  [place] Call for more information  Narcotics Anonymous (NA), [facility] [address], [employer] [place]  2 meetings at this location   Special educational needs teacher         Address  Phone  Notes  ASAP Residential Treatment [place],    [place]  [phone]   [facility]  [address] [place] [address], [place], [place]   [place], [place] [phone] Admissions: 8am-3pm M-F  Incentives Substance [place] [address]    [place], [place] [phone]   [facility] [address] [place], [place], [place]   [facility] [address],  [place], [place]   Insight Programs - Intensive Outpatient [address] Kristeen [address], [place], [place]   [facility] ([place].,  [place], [place] [phone] or [phone]   [facility]idential Treatment Services (RTS) [address] [place], [place] Accepts Medicaid  [facility] [place] [address]  [place] [phone])[phone] Substance Abuse/Addiction Treatment   [facility] Organization         Address  Phone  Notes  [facility]  [phone]   Domenic Schwab, PhD [address] [place]lis [place], [place]   [phone] or [phone]   [place], [place] [phone]   [facility] [address] [place], [place] [phone] Insurance/Medicaid/sponsorship through [facility] and Families [address] [place]                                    [place], [place] [phone] [place] [address], [place] [phone]    Dr. Adele Schilder  [phone]   [facility] Dept. 1) [address] [place] 2) [place] 3)  [address], [place] [phone]  [phone]   [place] [phone] or [phone] (After Hours)

## 2015-01-15 NOTE — Evaluation (Signed)
Physical Therapy Evaluation Patient Details Name: Jill Short MRN: [id number] DOB: [birth year] Today's Date: 01/15/2015   History of Present Illness  Pt is an 81 y/o F w/ PMHx of afib w/ pacemaker on eliquis, HTN, DM, and HLD who presents with bright red blood per rectum. She was recently admitted 6/12-20 for the same reason; During hospital stay, noted R shoulder pain, xray negative for fracture, but with abnormality in subacromial space, indicative of rotator cuff pathology; PT order for R shoulder pain  Clinical Impression   Patient evaluated by Physical Therapy with no further acute PT needs identified, as she is to discharge today. All education has been completed and the patient has no further questions.  See below for any follow-up Physical Therapy or equipment needs. Acute PT is signing off. Thank you for this referral.  Pt educated in shoulder arthrokinematics, function of deltiod and rotator cuff muscles and what happpens with rotator cuff dysfunction; Educated in ice massage techniques and gentle rotator cuff therex; Highly recommend Outpatient PT follow-up for rotator cuff dysfunction;   Worth considering MRI to help determine the extent of soft tissue/rotator cuff injury; this can be done outpatient of course, and recommend Outpatient Orthopedic follow up for shoulder       Follow Up Recommendations Outpatient PT  Orthopedic follow-up for her shoulder    Equipment Recommendations  Kasandra Knudsen (still not sure that pt has one)    Recommendations for Other Services OT consult     Precautions / Restrictions Precautions Precautions: Fall      Mobility  Bed Mobility Overal bed mobility: Independent                Transfers   Equipment used: None Transfers: Sit to/from Stand Sit to Stand: Supervision         General transfer comment: Supervision for safety  Ambulation/Gait Ambulation/Gait assistance: Supervision Ambulation Distance (Feet): 10 Feet Assistive  device: None Gait Pattern/deviations: Trunk flexed     General Gait Details: mild instability with in room amb  Stairs            Wheelchair Mobility    Modified Rankin (Stroke Patients Only)       Balance                                             Pertinent Vitals/Pain Pain Assessment: 0-10 Pain Score: 8  Pain Location: R shoulder with elevation/abduction actively; no pain with passive abduction Pain Descriptors / Indicators: Aching    Home Living Family/patient expects to be discharged to:: Private residence Living Arrangements: Spouse/significant other Available Help at Discharge: Family;Available 24 hours/day Type of Home: Other(Comment) (motor home) Home Access: Stairs to enter Entrance Stairs-Rails: Right Entrance Stairs-Number of Steps: 3 Home Layout: One level   Additional Comments: Cane was recommended for amb during last recent admission; not sure if she has one    Prior Function Level of Independence: Independent               Hand Dominance   Dominant Hand: Right    Extremity/Trunk Assessment   Upper Extremity Assessment: RUE deficits/detail RUE Deficits / Details: Pain is limiting R shoulder active flexion and abduction; full ROM with passive shoulder abduction -- this is indicative of muscle and/or tendon injury         Lower Extremity Assessment: Generalized weakness  Cervical / Trunk Assessment: Normal  Communication   Communication: No difficulties  Cognition Arousal/Alertness: Awake/alert Behavior During Therapy: WFL for tasks assessed/performed Overall Cognitive Status: Within Functional Limits for tasks assessed                      General Comments General comments (skin integrity, edema, etc.): Pt educated in shoulder arthrokinematics, function of deltiod and rotator cuff muscles and what happpens with rotator cuff dysfunction; Educated in ice massage techniques and gentle rotator cuff  therex; Highly recommend Outpatient PT follow-up for rotator cuff dysfunction    Exercises Other Exercises Other Exercises: R shoudler resistived internal and external rotation with level one theraband times 10 each      Assessment/Plan    PT Assessment All further PT needs can be met in the next venue of care  PT Diagnosis Acute pain   PT Problem List Pain;Decreased strength;Decreased range of motion;Decreased activity tolerance;Decreased balance;Decreased knowledge of use of DME  PT Treatment Interventions Gait training;Functional mobility training;Therapeutic activities;Therapeutic exercise;Neuromuscular re-education;Patient/family education;Modalities   PT Goals (Current goals can be found in the Care Plan section) Acute Rehab PT Goals Patient Stated Goal: to get better PT Goal Formulation: All assessment and education complete, DC therapy (noted for imminent discharge)    Frequency     Barriers to discharge        Co-evaluation               End of Session   Activity Tolerance: Patient tolerated treatment well Patient left: in bed;with call bell/phone within reach (sittign EOB eating lunch) Nurse Communication: Mobility status    Functional Assessment Tool Used: Clinical Judgement Functional Limitation: Mobility: Walking and moving around Mobility: Walking and Moving Around Current Status (K9326): At least 1 percent but less than 20 percent impaired, limited or restricted Mobility: Walking and Moving Around Goal Status [phone]): At least 1 percent but less than 20 percent impaired, limited or restricted Mobility: Walking and Moving Around Discharge Status [phone]): At least 1 percent but less than 20 percent impaired, limited or restricted    Time: 1210-1249 (minus a few minutes to get theraband) PT Time Calculation (min) (ACUTE ONLY): 39 min   Charges:   PT Evaluation $Initial PT Evaluation Tier I: 1 Procedure PT Treatments $Therapeutic Exercise: 8-22 mins    PT G Codes:   PT G-Codes **NOT FOR INPATIENT CLASS** Functional Assessment Tool Used: Clinical Judgement Functional Limitation: Mobility: Walking and moving around Mobility: Walking and Moving Around Current Status (P3825): At least 1 percent but less than 20 percent impaired, limited or restricted Mobility: Walking and Moving Around Goal Status [phone]): At least 1 percent but less than 20 percent impaired, limited or restricted Mobility: Walking and Moving Around Discharge Status [phone]): At least 1 percent but less than 20 percent impaired, limited or restricted    Roney Marion Hamff 01/15/2015, 1:08 PM  Roney Marion, [place] Pager [phone] Office [phone]

## 2015-01-15 NOTE — Progress Notes (Signed)
Subjective:  VSS.  Pt is walking around the room and feels well.  States her R shoulder pain is better.  Had a BM this AM but did not notice any blood.  R shoulder feels better but is still bothering her some.  We will have PT come by prior to her discharge to see if they can help her with this.    Objective: Vital signs in last 24 hours: Filed Vitals:   01/14/15 0500 01/14/15 1000 01/14/15 1759 01/15/15 0450  BP: 130/70 115/87 118/73 125/68  Pulse: 87 88 83 69  Temp: 98 F (36.7 C) 98 F (36.7 C) 97.8 F (36.6 C) 97.6 F (36.4 C)  TempSrc: Oral Oral Oral   Resp: 16 18 18 17   Height:      Weight:      SpO2: 97% 99% 96% 98%   Weight change:   Intake/Output Summary (Last 24 hours) at 01/15/15 0856 Last data filed at 01/15/15 0700  Gross per 24 hour  Intake    840 ml  Output   1900 ml  Net  -1060 ml   Physical Exam General: elderly woman walking in the room, NAD HEENT: [place]/AT, EOMI, sclera anicteric, mucus membranes moist CV: RRR, systolic murmur Pulm: CTA bilaterally Abd: BS+, soft, non-tender Msk: R shoulder ROM limited d/t pain  Ext: warm, no edema, ROM limited in RUE  Neuro: alert and oriented x 3.   Lab Results:  CBC:  Recent Labs Lab 01/14/15 0320 01/15/15 0351  WBC 5.2 4.1  HGB 9.3* 9.1*  HCT 28.8* 28.2*  MCV 95.0 94.9  PLT 427* 415*   CBG:  Recent Labs Lab 01/13/15 2123 01/14/15 0801 01/14/15 1145 01/14/15 1647 01/14/15 2131 01/14/15 2238  GLUCAP 99 83 76 84 68 105*   Studies/Results: Dg Shoulder Right  01/14/2015   CLINICAL DATA:  Acute right shoulder pain for 2 days. Patient has been pulling herself of in the bed. They have injured shoulder. No previous injury or surgery.  EXAM: RIGHT SHOULDER - 2+ VIEW  COMPARISON:  None.  FINDINGS: Bones appear osteopenic. There is subacromial narrowing which can be associated with rotator cuff injury. No evidence for acute fracture or subluxation. Visualized portion of the right lung apex is  unremarkable.  IMPRESSION: 1.  No evidence for acute  abnormality. 2. Subacromial narrowing.   Electronically Signed   By: Nolon Nations M.D.   On: 01/14/2015 11:26   Nm Gi Blood Loss  01/13/2015   CLINICAL DATA:  GI bleeding per rectum.  EXAM: NUCLEAR MEDICINE GASTROINTESTINAL BLEEDING SCAN  TECHNIQUE: Sequential abdominal images were obtained following intravenous administration of Tc-29m labeled red blood cells.  RADIOPHARMACEUTICALS:  25.0 mCi Tc-53m in-vitro labeled red cells.  COMPARISON:  None.  FINDINGS: Sequential planar images were obtained over the abdomen/ pelvis up to 2 hours. Examination demonstrates normal visualization of the aorta and iliac vessels. There is no focal abnormal migrating radiotracer activity to suggest GI bleed. Normal radiotracer accumulation over the bladder on the later images.  IMPRESSION: No evidence of GI bleed.   Electronically Signed   By: Marin Olp M.D.   On: 01/13/2015 19:01   Medications: I have reviewed the patient's current medications. Scheduled Meds: . aspirin EC  81 mg Oral Daily  . calcium-vitamin D  1 tablet Oral BID  . fenofibrate  54 mg Oral Daily  . ferrous sulfate  325 mg Oral Q breakfast  . pantoprazole  40 mg Oral Daily  . polyethylene glycol  17 g Oral TID  . sodium chloride  3 mL Intravenous Q12H  . vitamin B-12  1,000 mcg Oral Daily   Continuous Infusions:  PRN Meds:.acetaminophen **OR** acetaminophen Assessment/Plan:  GI Bleeding in Setting of Severe Diverticulosis Hbg stable today and no further episodes of bleeding.  OK to be discharged with hemoglobin check later this week.  Received feraheme yesterday.   -cont iron supplementation. -cont Miralax 2-3 times daily    Right Shoulder Pain Right shoulder pain yesterday; denied trauma to the area. She has decreased ROM in her right arm, only able to lift 90 degrees due to pain. XR R shoulder without acute abnormality but subacromial narrowing.  -PT prior to d/c to prevent  adhesive capsulitis   Lower Extremity Edema Edema has resolved. Dopplers negative for DVT.   Type 2 DM Patient takes Metformin 500 mg daily at home. CBGs well controlled.  -resume metformin   HTN -hold for now   Atrial Fibrillation with Pacemaker Patient normally on Eliquis at home for her AFib but this was held upon discharge on 6/18 due to her GI bleeding. She was supposed to resume Eliquis on 6/25 but she has had continued GI bleeding.  -hold eliquis x 1 week  -cont ASA 81 mg daily   Hyperlipidemia Patient takes Fenofibrate 50 mg daily at home. -cont fenofibrate   Diet: Clears  VTE PPx: SCDs Dispo: Expected discharge today.   The patient does have a current PCP (No primary care provider on file.) and does need an [facility] hospital follow-up appointment after discharge.     Jones Bales, MD 01/15/2015, 8:56 AM

## 2015-01-16 ENCOUNTER — Encounter (HOSPITAL_COMMUNITY): Payer: Self-pay | Admitting: Gastroenterology

## 2015-01-16 LAB — TYPE AND SCREEN
ABO/RH(D): A NEG
ANTIBODY SCREEN: NEGATIVE
Unit division: 0
Unit division: 0

## 2015-01-17 NOTE — Anesthesia Postprocedure Evaluation (Signed)
  Anesthesia Post-op Note  Patient: Jill Short  Procedure(s) Performed: Procedure(s): ESOPHAGOGASTRODUODENOSCOPY (EGD) (N/A)  Patient Location: PACU  Anesthesia Type: MAC   Level of Consciousness: awake, alert  and oriented  Airway and Oxygen Therapy: Patient Spontanous Breathing  Post-op Pain: none  Post-op Assessment: Post-op Vital signs reviewed  Post-op Vital Signs: Reviewed  Last Vitals:  Filed Vitals:   01/15/15 1000  BP: 143/76  Pulse: 69  Temp: 36.9 C  Resp: 18    Complications: No apparent anesthesia complications

## 2015-01-19 ENCOUNTER — Ambulatory Visit (INDEPENDENT_AMBULATORY_CARE_PROVIDER_SITE_OTHER): Payer: Medicare Other | Admitting: Internal Medicine

## 2015-01-19 ENCOUNTER — Encounter: Payer: Self-pay | Admitting: Internal Medicine

## 2015-01-19 VITALS — BP 124/65 | HR 69 | Temp 98.3°F | Ht 66.0 in | Wt 133.8 lb

## 2015-01-19 DIAGNOSIS — K922 Gastrointestinal hemorrhage, unspecified: Secondary | ICD-10-CM

## 2015-01-19 DIAGNOSIS — E119 Type 2 diabetes mellitus without complications: Secondary | ICD-10-CM | POA: Diagnosis not present

## 2015-01-19 DIAGNOSIS — Z79899 Other long term (current) drug therapy: Secondary | ICD-10-CM | POA: Diagnosis not present

## 2015-01-19 LAB — CBC
HEMATOCRIT: 33.3 % — AB (ref 36.0–46.0)
Hemoglobin: 10.6 g/dL — ABNORMAL LOW (ref 12.0–15.0)
MCH: 30.8 pg (ref 26.0–34.0)
MCHC: 31.8 g/dL (ref 30.0–36.0)
MCV: 96.8 fL (ref 78.0–100.0)
MPV: 9.1 fL (ref 8.6–12.4)
Platelets: 455 10*3/uL — ABNORMAL HIGH (ref 150–400)
RBC: 3.44 MIL/uL — ABNORMAL LOW (ref 3.87–5.11)
RDW: 15.7 % — AB (ref 11.5–15.5)
WBC: 5.9 10*3/uL (ref 4.0–10.5)

## 2015-01-19 LAB — GLUCOSE, CAPILLARY: Glucose-Capillary: 65 mg/dL (ref 65–99)

## 2015-01-19 LAB — POCT GLYCOSYLATED HEMOGLOBIN (HGB A1C): Hemoglobin A1C: 4.4

## 2015-01-19 MED ORDER — POLYETHYLENE GLYCOL 3350 17 GM/SCOOP PO POWD: 17.0000 g | Freq: Two times a day (BID) | ORAL | Status: DC

## 2015-01-19 NOTE — Assessment & Plan Note (Signed)
Taking Metformin 500 mg BID. Reports of low normal glucose while admitted. CBG today 65. HgbA1c 4.4.  Plan: -STOP Metformin

## 2015-01-19 NOTE — Patient Instructions (Signed)
General Instructions:   Please bring your medicines with you each time you come to clinic.  Medicines may include prescription medications, over-the-counter medications, herbal remedies, eye drops, vitamins, or other pills.    STOP TAKING ASPIRIN ON 01/22/15. START TAKING ELIQUIS AS YOU BEFORE ON 01/22/15.  CONTINUE TAKING MIRALAX 2 TO 3 TIMES PER DAY TO AVOID CONSTIPATION. CONTINUE TAKING FERROUS SULFATE 1 TABLET EACH DAY.

## 2015-01-19 NOTE — Assessment & Plan Note (Addendum)
Patient recently discharged from the hospital on 6/26 following a GIB. GIB was attributed to constipation in the setting of severe diverticulosis and being on Eliquis. Eliquis was held and patient resumed on Miralax. Her hemoglobin remained stable during admission. Discharge Hgb 9.1. Bloody BMs resolved. Patient was discharged on ferrous sulfate, Miralax BID-TID, and Eliquis was held. Patient is to resume Eliquis 2.5 mg BID 7/3. Patient is to follow up with GI as outpatient. Patient states compliance with miralax, ferrous sulfate, aspirin, and denies any recurrent bloody bowel movements.   Plan: -Recheck CBC -Continue Miralax BID-TID  -Continue Ferrous Sulfate 325 mg daily -Restart Eliquis 2.5 mg BID on 01/22/15 -Stop Aspirin on 01/21/15 -Follow up with GI -Follow up with PCP Dr. Tomi Likens in [place], [place] (will send records) -Monitor stools for hematochezia

## 2015-01-19 NOTE — Progress Notes (Signed)
   Subjective:    Patient ID: Jill Short, female    DOB: October 11, 1930, 81 y.o.   MRN: [id number]  HPI Jill Short is an 81 yo female with PMHx of T2DM, Atrial Fibrillation, severe diverticulosis, and pacemaker placement who presents for hospital follow up after a GIB. Please see problem oriented assessment and plan for more information.  Review of Systems General: Denies fever, chills, fatigue Respiratory: Denies SOB, DOE   Cardiovascular: Denies chest pain and palpitations.  Gastrointestinal: Denies nausea, vomiting, abdominal pain, diarrhea, constipation, blood in stool  Musculoskeletal: Denies myalgias, back pain Skin: Denies pallor, rash and wounds.  Neurological: Denies dizziness, headaches, weakness, lightheadedness, and syncope  Past Medical History  Diagnosis Date  . Coronary artery disease   . Pacemaker    Outpatient Encounter Prescriptions as of 01/19/2015  Medication Sig Note  . aspirin EC 81 MG tablet Take 81 mg by mouth daily.   . bacitracin ointment Apply 1 application topically 2 (two) times daily.   . calcium-vitamin D (OSCAL WITH D) 500-200 MG-UNIT per tablet Take 1 tablet by mouth 2 (two) times daily.   . cholecalciferol (VITAMIN D) 1000 UNITS tablet Take 1,000 Units by mouth daily.   . Cyanocobalamin (B-12 PO) Take 1 tablet by mouth daily.   . fenofibrate (TRIGLIDE) 50 MG tablet Take 50 mg by mouth daily.   . ferrous sulfate 325 (65 FE) MG tablet Take 1 tablet (325 mg total) by mouth daily with breakfast.   . Flaxseed, Linseed, (FLAX PO) Take 1,200 mg by mouth daily.   . metFORMIN (GLUCOPHAGE) 500 MG tablet Take 500 mg by mouth 2 (two) times daily with a meal.   . pantoprazole (PROTONIX) 40 MG tablet Take 1 tablet (40 mg total) by mouth daily. 01/12/2015: Patient has not started taking it yet per daughter  . polyethylene glycol powder (GLYCOLAX/MIRALAX) powder Take 17 g by mouth 2 (two) times daily.   . vitamin C (ASCORBIC ACID) 500 MG tablet Take 500 mg by mouth  daily.   . [DISCONTINUED] polyethylene glycol (MIRALAX / GLYCOLAX) packet Use 2-3 times per day to have a regular bowel movement.    No facility-administered encounter medications on file as of 01/19/2015.      Objective:   Physical Exam Filed Vitals:   01/19/15 1328  BP: 124/65  Pulse: 69  Temp: 98.3 F (36.8 C)  TempSrc: Oral  Height: 5\' 6"  (1.676 m)  Weight: 133 lb 12.8 oz (60.691 kg)  SpO2: 100%   General: Vital signs reviewed.  Patient is an elderly female, in no acute distress and cooperative with exam.  Cardiovascular: RRR, S1 normal, S2 normal Pulmonary/Chest: Clear to auscultation bilaterally, no wheezes, rales, or rhonchi. Abdominal: Soft, non-tender, non-distended, BS +, no masses, or guarding present.  Extremities: No lower extremity edema bilaterally, pulses symmetric and intact bilaterally.  Neurological: A&O x3 Skin: Warm, dry and intact. No rashes or erythema.    Assessment & Plan:   Please see problem oriented assessment and plan.

## 2015-01-20 NOTE — Progress Notes (Signed)
Internal Medicine Clinic Attending  Case discussed with Dr. Richardson soon after the resident saw the patient.  We reviewed the resident's history and exam and pertinent patient test results.  I agree with the assessment, diagnosis, and plan of care documented in the resident's note. 

## 2015-01-26 NOTE — Progress Notes (Signed)
Patient ID: Jill Short, female   DOB: Jun 06, 1931, 81 y.o.   MRN: [id number] Medicine attending discharge note: 81 year old woman from [place] who came to [place] to visit her family. She was initially admitted on June 12 with hematochezia. No abdominal pain. She has known diverticulosis. She had intermittent bleeding while hospitalized. She was seen in consultation by gastroenterology. A colonoscopy was performed on June 17. No active bleeding seen at that time. 3 days following discharge, she developed recurrent rectal bleeding and was readmitted on June 23. At time of initial admission, she was on anticoagulation for chronic atrial fibrillation. Anticoagulation was held and not resumed at time of discharge.  Hospital course:  Initial blood pressure 117/81, pulse 71 and regular. Abdomen remained soft, nontender, not distended, no mass or organomegaly. Hemoglobin was 7.9 at time of discharge on June 18, 8.1 on day of current admission, subsequent fall within 24 hours to 7.2 g. She received a blood transfusion. Blood pressures improved up to the 201 systolic range. GI was reconsulted. Upper endoscopy was done on June 24. She had a hiatal hernia but no evidence of esophagitis or ulcer. No source of bleeding from the upper GI tract. A technetium  labeled red cell scan was done and did not show any obvious point of bleeding. Once again, her hematochezia resolved spontaneously. Hemoglobin remained stable with an additional 48 hours of observation and was 9.1 at time of discharge on June 26.  Disposition: Condition stable at time of discharge. Source of bleeding still presumed to be from diverticuli. We cannot exclude the possibility of small bowel AVMs and would consider a capsule endoscopy if bleeding recurs again. We will schedule a short interval follow-up in our clinic. Continue to hold anticoagulation.

## 2015-02-14 ENCOUNTER — Other Ambulatory Visit: Payer: Self-pay | Admitting: Internal Medicine

## 2015-03-08 ENCOUNTER — Other Ambulatory Visit: Payer: Self-pay | Admitting: Internal Medicine

## 2015-03-28 DIAGNOSIS — Z1231 Encounter for screening mammogram for malignant neoplasm of breast: Secondary | ICD-10-CM | POA: Diagnosis not present

## 2015-03-31 DIAGNOSIS — L602 Onychogryphosis: Secondary | ICD-10-CM | POA: Diagnosis not present

## 2015-03-31 DIAGNOSIS — L84 Corns and callosities: Secondary | ICD-10-CM | POA: Diagnosis not present

## 2015-03-31 DIAGNOSIS — E1149 Type 2 diabetes mellitus with other diabetic neurological complication: Secondary | ICD-10-CM | POA: Diagnosis not present

## 2015-04-05 ENCOUNTER — Other Ambulatory Visit: Payer: Self-pay | Admitting: Internal Medicine

## 2015-04-05 NOTE — Telephone Encounter (Signed)
Pt called requesting ferrous sulfate to be filled @ [facility] on [address]. Please call pt back.

## 2015-04-05 NOTE — Telephone Encounter (Signed)
Pt in [place] visiting daughter-needs ferrous sulfate rx called into [facility] in [place], [place] (ph# [phone]).  Rx called in with one additional refill.Despina Hidden Cassady9/14/20163:35 PM

## 2015-04-13 DIAGNOSIS — I482 Chronic atrial fibrillation: Secondary | ICD-10-CM | POA: Diagnosis not present

## 2015-04-13 DIAGNOSIS — I10 Essential (primary) hypertension: Secondary | ICD-10-CM | POA: Diagnosis not present

## 2015-04-13 DIAGNOSIS — I350 Nonrheumatic aortic (valve) stenosis: Secondary | ICD-10-CM | POA: Diagnosis not present

## 2015-04-13 DIAGNOSIS — E782 Mixed hyperlipidemia: Secondary | ICD-10-CM | POA: Diagnosis not present

## 2015-04-13 DIAGNOSIS — I429 Cardiomyopathy, unspecified: Secondary | ICD-10-CM | POA: Diagnosis not present

## 2015-04-13 DIAGNOSIS — Z7901 Long term (current) use of anticoagulants: Secondary | ICD-10-CM | POA: Diagnosis not present

## 2015-04-14 DIAGNOSIS — Z23 Encounter for immunization: Secondary | ICD-10-CM | POA: Diagnosis not present

## 2015-04-26 DIAGNOSIS — Z0000 Encounter for general adult medical examination without abnormal findings: Secondary | ICD-10-CM | POA: Diagnosis not present

## 2015-04-26 DIAGNOSIS — E782 Mixed hyperlipidemia: Secondary | ICD-10-CM | POA: Diagnosis not present

## 2015-04-26 DIAGNOSIS — I10 Essential (primary) hypertension: Secondary | ICD-10-CM | POA: Diagnosis not present

## 2015-04-26 DIAGNOSIS — K5731 Diverticulosis of large intestine without perforation or abscess with bleeding: Secondary | ICD-10-CM | POA: Diagnosis not present

## 2015-04-26 DIAGNOSIS — Z950 Presence of cardiac pacemaker: Secondary | ICD-10-CM | POA: Diagnosis not present

## 2015-04-26 DIAGNOSIS — I482 Chronic atrial fibrillation: Secondary | ICD-10-CM | POA: Diagnosis not present

## 2015-04-26 DIAGNOSIS — R634 Abnormal weight loss: Secondary | ICD-10-CM | POA: Diagnosis not present

## 2015-06-08 DIAGNOSIS — B351 Tinea unguium: Secondary | ICD-10-CM | POA: Diagnosis not present

## 2015-06-21 DIAGNOSIS — I495 Sick sinus syndrome: Secondary | ICD-10-CM | POA: Diagnosis not present

## 2015-06-21 DIAGNOSIS — Z950 Presence of cardiac pacemaker: Secondary | ICD-10-CM | POA: Diagnosis not present

## 2015-06-30 DIAGNOSIS — I495 Sick sinus syndrome: Secondary | ICD-10-CM | POA: Diagnosis not present

## 2015-07-18 DIAGNOSIS — D649 Anemia, unspecified: Secondary | ICD-10-CM | POA: Diagnosis not present

## 2015-07-18 DIAGNOSIS — K219 Gastro-esophageal reflux disease without esophagitis: Secondary | ICD-10-CM | POA: Diagnosis not present

## 2015-07-18 DIAGNOSIS — M79632 Pain in left forearm: Secondary | ICD-10-CM | POA: Diagnosis not present

## 2015-07-18 DIAGNOSIS — L988 Other specified disorders of the skin and subcutaneous tissue: Secondary | ICD-10-CM | POA: Diagnosis not present

## 2015-07-18 DIAGNOSIS — Z950 Presence of cardiac pacemaker: Secondary | ICD-10-CM | POA: Diagnosis not present

## 2015-07-18 DIAGNOSIS — L989 Disorder of the skin and subcutaneous tissue, unspecified: Secondary | ICD-10-CM | POA: Diagnosis not present

## 2015-07-18 DIAGNOSIS — Z79899 Other long term (current) drug therapy: Secondary | ICD-10-CM | POA: Diagnosis not present

## 2015-07-19 DIAGNOSIS — L723 Sebaceous cyst: Secondary | ICD-10-CM | POA: Diagnosis not present

## 2015-07-19 DIAGNOSIS — R2232 Localized swelling, mass and lump, left upper limb: Secondary | ICD-10-CM | POA: Diagnosis not present

## 2015-07-20 ENCOUNTER — Encounter: Payer: Self-pay | Admitting: Internal Medicine

## 2015-07-26 DIAGNOSIS — Z01818 Encounter for other preprocedural examination: Secondary | ICD-10-CM | POA: Diagnosis not present

## 2015-07-26 DIAGNOSIS — R2232 Localized swelling, mass and lump, left upper limb: Secondary | ICD-10-CM | POA: Diagnosis not present

## 2015-07-26 DIAGNOSIS — Z950 Presence of cardiac pacemaker: Secondary | ICD-10-CM | POA: Diagnosis not present

## 2015-08-03 DIAGNOSIS — I252 Old myocardial infarction: Secondary | ICD-10-CM | POA: Diagnosis not present

## 2015-08-03 DIAGNOSIS — C44629 Squamous cell carcinoma of skin of left upper limb, including shoulder: Secondary | ICD-10-CM | POA: Diagnosis not present

## 2015-08-03 DIAGNOSIS — C44619 Basal cell carcinoma of skin of left upper limb, including shoulder: Secondary | ICD-10-CM | POA: Diagnosis not present

## 2015-08-03 DIAGNOSIS — I10 Essential (primary) hypertension: Secondary | ICD-10-CM | POA: Diagnosis not present

## 2015-08-03 DIAGNOSIS — C44621 Squamous cell carcinoma of skin of unspecified upper limb, including shoulder: Secondary | ICD-10-CM | POA: Diagnosis not present

## 2015-08-03 DIAGNOSIS — Z950 Presence of cardiac pacemaker: Secondary | ICD-10-CM | POA: Diagnosis not present

## 2015-08-16 DIAGNOSIS — C44621 Squamous cell carcinoma of skin of unspecified upper limb, including shoulder: Secondary | ICD-10-CM | POA: Diagnosis not present

## 2015-08-17 DIAGNOSIS — I83813 Varicose veins of bilateral lower extremities with pain: Secondary | ICD-10-CM | POA: Diagnosis not present

## 2015-08-17 DIAGNOSIS — B351 Tinea unguium: Secondary | ICD-10-CM | POA: Diagnosis not present

## 2015-08-17 DIAGNOSIS — I70213 Atherosclerosis of native arteries of extremities with intermittent claudication, bilateral legs: Secondary | ICD-10-CM | POA: Diagnosis not present

## 2015-08-30 DIAGNOSIS — I10 Essential (primary) hypertension: Secondary | ICD-10-CM | POA: Diagnosis not present

## 2015-08-30 DIAGNOSIS — Z950 Presence of cardiac pacemaker: Secondary | ICD-10-CM | POA: Diagnosis not present

## 2015-08-30 DIAGNOSIS — C44629 Squamous cell carcinoma of skin of left upper limb, including shoulder: Secondary | ICD-10-CM | POA: Diagnosis not present

## 2015-08-30 DIAGNOSIS — D2362 Other benign neoplasm of skin of left upper limb, including shoulder: Secondary | ICD-10-CM | POA: Diagnosis not present

## 2015-09-18 DIAGNOSIS — C44629 Squamous cell carcinoma of skin of left upper limb, including shoulder: Secondary | ICD-10-CM | POA: Diagnosis not present

## 2015-09-22 DIAGNOSIS — D2362 Other benign neoplasm of skin of left upper limb, including shoulder: Secondary | ICD-10-CM | POA: Diagnosis not present

## 2015-10-26 DIAGNOSIS — B351 Tinea unguium: Secondary | ICD-10-CM | POA: Diagnosis not present

## 2015-11-15 DIAGNOSIS — I495 Sick sinus syndrome: Secondary | ICD-10-CM | POA: Diagnosis not present

## 2015-11-15 DIAGNOSIS — Z950 Presence of cardiac pacemaker: Secondary | ICD-10-CM | POA: Diagnosis not present

## 2015-11-20 HISTORY — PX: INSERT / REPLACE / REMOVE PACEMAKER: SUR710

## 2016-02-16 DIAGNOSIS — E782 Mixed hyperlipidemia: Secondary | ICD-10-CM | POA: Diagnosis not present

## 2016-02-16 DIAGNOSIS — I482 Chronic atrial fibrillation: Secondary | ICD-10-CM | POA: Diagnosis not present

## 2016-02-16 DIAGNOSIS — I10 Essential (primary) hypertension: Secondary | ICD-10-CM | POA: Diagnosis not present

## 2016-02-16 DIAGNOSIS — Z1239 Encounter for other screening for malignant neoplasm of breast: Secondary | ICD-10-CM | POA: Diagnosis not present

## 2016-02-16 DIAGNOSIS — E559 Vitamin D deficiency, unspecified: Secondary | ICD-10-CM | POA: Diagnosis not present

## 2016-02-27 DIAGNOSIS — E1149 Type 2 diabetes mellitus with other diabetic neurological complication: Secondary | ICD-10-CM | POA: Diagnosis not present

## 2016-02-27 DIAGNOSIS — L84 Corns and callosities: Secondary | ICD-10-CM | POA: Diagnosis not present

## 2016-02-27 DIAGNOSIS — L602 Onychogryphosis: Secondary | ICD-10-CM | POA: Diagnosis not present

## 2016-03-29 DIAGNOSIS — Z1231 Encounter for screening mammogram for malignant neoplasm of breast: Secondary | ICD-10-CM | POA: Diagnosis not present

## 2016-04-03 DIAGNOSIS — I10 Essential (primary) hypertension: Secondary | ICD-10-CM | POA: Diagnosis not present

## 2016-04-03 DIAGNOSIS — I482 Chronic atrial fibrillation: Secondary | ICD-10-CM | POA: Diagnosis not present

## 2016-04-03 DIAGNOSIS — E782 Mixed hyperlipidemia: Secondary | ICD-10-CM | POA: Diagnosis not present

## 2016-04-05 DIAGNOSIS — Z23 Encounter for immunization: Secondary | ICD-10-CM | POA: Diagnosis not present

## 2016-04-09 DIAGNOSIS — I429 Cardiomyopathy, unspecified: Secondary | ICD-10-CM | POA: Diagnosis not present

## 2016-04-09 DIAGNOSIS — Z1239 Encounter for other screening for malignant neoplasm of breast: Secondary | ICD-10-CM | POA: Diagnosis not present

## 2016-04-09 DIAGNOSIS — E559 Vitamin D deficiency, unspecified: Secondary | ICD-10-CM | POA: Diagnosis not present

## 2016-04-09 DIAGNOSIS — Z950 Presence of cardiac pacemaker: Secondary | ICD-10-CM | POA: Diagnosis not present

## 2016-04-09 DIAGNOSIS — Z7901 Long term (current) use of anticoagulants: Secondary | ICD-10-CM | POA: Diagnosis not present

## 2016-04-09 DIAGNOSIS — I10 Essential (primary) hypertension: Secondary | ICD-10-CM | POA: Diagnosis not present

## 2016-04-09 DIAGNOSIS — I482 Chronic atrial fibrillation: Secondary | ICD-10-CM | POA: Diagnosis not present

## 2016-04-09 DIAGNOSIS — E119 Type 2 diabetes mellitus without complications: Secondary | ICD-10-CM | POA: Diagnosis not present

## 2016-04-09 DIAGNOSIS — E782 Mixed hyperlipidemia: Secondary | ICD-10-CM | POA: Diagnosis not present

## 2016-04-09 DIAGNOSIS — Z0000 Encounter for general adult medical examination without abnormal findings: Secondary | ICD-10-CM | POA: Diagnosis not present

## 2016-04-11 DIAGNOSIS — S0990XA Unspecified injury of head, initial encounter: Secondary | ICD-10-CM | POA: Diagnosis not present

## 2016-04-11 DIAGNOSIS — Y9389 Activity, other specified: Secondary | ICD-10-CM | POA: Diagnosis not present

## 2016-04-11 DIAGNOSIS — S299XXA Unspecified injury of thorax, initial encounter: Secondary | ICD-10-CM | POA: Diagnosis not present

## 2016-04-11 DIAGNOSIS — W108XXA Fall (on) (from) other stairs and steps, initial encounter: Secondary | ICD-10-CM | POA: Diagnosis not present

## 2016-04-11 DIAGNOSIS — Y92008 Other place in unspecified non-institutional (private) residence as the place of occurrence of the external cause: Secondary | ICD-10-CM | POA: Diagnosis not present

## 2016-04-11 DIAGNOSIS — S81811A Laceration without foreign body, right lower leg, initial encounter: Secondary | ICD-10-CM | POA: Diagnosis not present

## 2016-04-11 DIAGNOSIS — M25512 Pain in left shoulder: Secondary | ICD-10-CM | POA: Diagnosis not present

## 2016-04-11 DIAGNOSIS — S0511XA Contusion of eyeball and orbital tissues, right eye, initial encounter: Secondary | ICD-10-CM | POA: Diagnosis not present

## 2016-04-11 DIAGNOSIS — T148 Other injury of unspecified body region: Secondary | ICD-10-CM | POA: Diagnosis not present

## 2016-04-11 DIAGNOSIS — S01112A Laceration without foreign body of left eyelid and periocular area, initial encounter: Secondary | ICD-10-CM | POA: Diagnosis not present

## 2016-04-11 DIAGNOSIS — E785 Hyperlipidemia, unspecified: Secondary | ICD-10-CM | POA: Diagnosis not present

## 2016-04-11 DIAGNOSIS — K219 Gastro-esophageal reflux disease without esophagitis: Secondary | ICD-10-CM | POA: Diagnosis not present

## 2016-04-11 DIAGNOSIS — Z23 Encounter for immunization: Secondary | ICD-10-CM | POA: Diagnosis not present

## 2016-04-11 DIAGNOSIS — Z7902 Long term (current) use of antithrombotics/antiplatelets: Secondary | ICD-10-CM | POA: Diagnosis not present

## 2016-04-11 DIAGNOSIS — S0003XA Contusion of scalp, initial encounter: Secondary | ICD-10-CM | POA: Diagnosis not present

## 2016-04-11 DIAGNOSIS — S0512XA Contusion of eyeball and orbital tissues, left eye, initial encounter: Secondary | ICD-10-CM | POA: Diagnosis not present

## 2016-04-11 DIAGNOSIS — Z950 Presence of cardiac pacemaker: Secondary | ICD-10-CM | POA: Diagnosis not present

## 2016-04-11 DIAGNOSIS — S0081XA Abrasion of other part of head, initial encounter: Secondary | ICD-10-CM | POA: Diagnosis not present

## 2016-04-12 DIAGNOSIS — W108XXA Fall (on) (from) other stairs and steps, initial encounter: Secondary | ICD-10-CM | POA: Diagnosis not present

## 2016-04-12 DIAGNOSIS — T148 Other injury of unspecified body region: Secondary | ICD-10-CM | POA: Diagnosis not present

## 2016-04-12 DIAGNOSIS — S0990XA Unspecified injury of head, initial encounter: Secondary | ICD-10-CM | POA: Diagnosis not present

## 2016-05-16 ENCOUNTER — Ambulatory Visit (INDEPENDENT_AMBULATORY_CARE_PROVIDER_SITE_OTHER): Payer: Medicare Other | Admitting: Internal Medicine

## 2016-05-16 VITALS — BP 142/72 | HR 70 | Temp 98.0°F | Wt 129.6 lb

## 2016-05-16 DIAGNOSIS — Z952 Presence of prosthetic heart valve: Secondary | ICD-10-CM

## 2016-05-16 DIAGNOSIS — D509 Iron deficiency anemia, unspecified: Secondary | ICD-10-CM | POA: Diagnosis not present

## 2016-05-16 DIAGNOSIS — Z8719 Personal history of other diseases of the digestive system: Secondary | ICD-10-CM

## 2016-05-16 DIAGNOSIS — R159 Full incontinence of feces: Secondary | ICD-10-CM

## 2016-05-16 DIAGNOSIS — Z7901 Long term (current) use of anticoagulants: Secondary | ICD-10-CM

## 2016-05-16 DIAGNOSIS — Z950 Presence of cardiac pacemaker: Secondary | ICD-10-CM

## 2016-05-16 DIAGNOSIS — I4891 Unspecified atrial fibrillation: Secondary | ICD-10-CM

## 2016-05-16 DIAGNOSIS — E785 Hyperlipidemia, unspecified: Secondary | ICD-10-CM | POA: Diagnosis not present

## 2016-05-16 DIAGNOSIS — K5731 Diverticulosis of large intestine without perforation or abscess with bleeding: Secondary | ICD-10-CM

## 2016-05-16 MED ORDER — METOPROLOL SUCCINATE ER 25 MG PO TB24: 25.0000 mg | ORAL_TABLET | Freq: Every day | ORAL | 11 refills | Status: DC

## 2016-05-16 MED ORDER — POLYETHYLENE GLYCOL 3350 17 GM/SCOOP PO POWD: 17.0000 g | Freq: Two times a day (BID) | ORAL | 0 refills | Status: DC | PRN

## 2016-05-16 NOTE — Patient Instructions (Addendum)
You were seen today for loose stools.  I think you've been having troubles loosing your stool because of the laxative, Miralax, that you are taking to prevent constipation.  Take Miralax as needed in order to have at least 1 bowel movement per day without straining.  I will check some blood work today to see how your blood counts are doing.  Please schedule an appointment to establish a PCP in 1-2 months.

## 2016-05-16 NOTE — Progress Notes (Signed)
   CC: fecal incontinence  HPI:  [name] is a 82 y.o. 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  For the past 6 weeks, she has had some fecal incontinence.  She would have very loose, watery stools and not feel the urge to defecate in time to make it to the bathroom.  Would not have incontinence with formed stool.  Denies numbness and rectal pain.  She had been taking Miralax daily for her diverticulosis for years, but in last 6 weeks has stopped since the issues with incontinence began.  Since then she is having 1 well formed stool daily and no further episodes of fecal incontinence.  She is occasional straining to have BM.  Sometimes thinks she has black stools, no hematochezzia, but doesn't pay attention to BM.  She also has a pacemaker in place for remote symptomatic bradycardia, and wonders if she needs a cardiology referral.  She typically lives in [place] in the summer and [place] in the winter, but has been visiting daughter Jill Short in [place] and may be spending more of her time here in the future.  Wants a local PCP.  [place] PCP Jill Short, on [address]  Cardiologist Dr Tomi Likens [phone]   Past Medical History:  Diagnosis Date  . Coronary artery disease   . Pacemaker     Review of Systems:   Review of Systems  Constitutional: Positive for malaise/fatigue. Negative for chills and fever.  HENT: Negative for nosebleeds.   Respiratory: Negative for cough and shortness of breath.   Cardiovascular: Negative for chest pain and palpitations.  Gastrointestinal: Negative for abdominal pain, nausea and vomiting.       See HPI  Genitourinary: Negative for dysuria and hematuria.  Neurological: Positive for weakness. Negative for dizziness.  Endo/Heme/Allergies: Does not bruise/bleed easily.    Physical Exam:  Vitals:   05/16/16 1452  BP:  (!) 142/72  Short: 70  Temp: 98 F (36.7 C)  TempSrc: Oral  SpO2: 98%  Weight: 129 lb 9.6 oz (58.8 kg)   Physical Exam  Constitutional: She is oriented to person, place, and time.  Elderly, somewhat frail appearing woman in no distress  Eyes: Conjunctivae are normal. No scleral icterus.  No conjunctival pallor  Cardiovascular: Normal rate and regular rhythm.   Pulmonary/Chest: Effort normal and breath sounds normal.  Abdominal: Soft. She exhibits no distension. There is no tenderness.  Neurological: She is alert and oriented to person, place, and time.  Skin: Skin is warm and dry. No pallor.  Psychiatric: She has a normal mood and affect. Her behavior is normal.    Assessment & Plan:   See Encounters Tab for problem based charting.  RTC 1-2 months for regular PCP visit.  Patient seen with Dr. Eppie Gibson

## 2016-05-17 ENCOUNTER — Encounter: Payer: Self-pay | Admitting: Internal Medicine

## 2016-05-17 DIAGNOSIS — E785 Hyperlipidemia, unspecified: Secondary | ICD-10-CM | POA: Insufficient documentation

## 2016-05-17 DIAGNOSIS — D62 Acute posthemorrhagic anemia: Secondary | ICD-10-CM | POA: Insufficient documentation

## 2016-05-17 LAB — CMP14 + ANION GAP
ALT: 10 IU/L (ref 0–32)
AST: 19 IU/L (ref 0–40)
Albumin/Globulin Ratio: 1.1 — ABNORMAL LOW (ref 1.2–2.2)
Albumin: 3.7 g/dL (ref 3.5–4.7)
Alkaline Phosphatase: 57 IU/L (ref 39–117)
Anion Gap: 13 mmol/L (ref 10.0–18.0)
BILIRUBIN TOTAL: 0.3 mg/dL (ref 0.0–1.2)
BUN/Creatinine Ratio: 34 — ABNORMAL HIGH (ref 12–28)
BUN: 25 mg/dL (ref 8–27)
CHLORIDE: 103 mmol/L (ref 96–106)
CO2: 26 mmol/L (ref 18–29)
Calcium: 9.2 mg/dL (ref 8.7–10.3)
Creatinine, Ser: 0.73 mg/dL (ref 0.57–1.00)
GFR calc non Af Amer: 76 mL/min/{1.73_m2} (ref >59)
GFR, EST AFRICAN AMERICAN: 88 mL/min/{1.73_m2} (ref >59)
GLUCOSE: 85 mg/dL (ref 65–99)
Globulin, Total: 3.4 g/dL (ref 1.5–4.5)
Potassium: 4.3 mmol/L (ref 3.5–5.2)
Sodium: 142 mmol/L (ref 134–144)
Total Protein: 7.1 g/dL (ref 6.0–8.5)

## 2016-05-17 LAB — CBC WITH DIFFERENTIAL/PLATELET
BASOS ABS: 0 10*3/uL (ref 0.0–0.2)
Basos: 0 %
EOS (ABSOLUTE): 0.1 10*3/uL (ref 0.0–0.4)
Eos: 1 %
Hematocrit: 38.9 % (ref 34.0–46.6)
Hemoglobin: 12.9 g/dL (ref 11.1–15.9)
IMMATURE GRANS (ABS): 0 10*3/uL (ref 0.0–0.1)
Immature Granulocytes: 0 %
LYMPHS ABS: 2.4 10*3/uL (ref 0.7–3.1)
LYMPHS: 31 %
MCH: 32.3 pg (ref 26.6–33.0)
MCHC: 33.2 g/dL (ref 31.5–35.7)
MCV: 97 fL (ref 79–97)
MONOCYTES: 5 %
Monocytes Absolute: 0.4 10*3/uL (ref 0.1–0.9)
Neutrophils Absolute: 4.8 10*3/uL (ref 1.4–7.0)
Neutrophils: 63 %
Platelets: 317 10*3/uL (ref 150–379)
RBC: 4 x10E6/uL (ref 3.77–5.28)
RDW: 13.9 % (ref 12.3–15.4)
WBC: 7.6 10*3/uL (ref 3.4–10.8)

## 2016-05-17 NOTE — Assessment & Plan Note (Signed)
Apixaban held for a month following GI bleed in2016, but she has been taking it again for over a year.  CHA2DS2-Vasc 2-3. -Continue apixaban 2.5 mg daily

## 2016-05-17 NOTE — Assessment & Plan Note (Signed)
History of GI bleeding and extensive diverticulosis by colonoscopy.  At that time, GI recommended scheduled daily Miralax to avoid constipation.  She is now having fecal incontinence likely related to her laxative use. -Miralax PRN with goal of 1+ BMs daily without straining

## 2016-05-17 NOTE — Assessment & Plan Note (Addendum)
Says pacemaker has been interrogated every 6 months in the office, think last time over the summer. -Request cardiology records -Reassess need for local cardiologist at next visit

## 2016-05-17 NOTE — Assessment & Plan Note (Addendum)
Reports being on chronic oral iron therapy for many years. -Request records -Continue d/c iron, Vit C at next visit to help regulate BMs  Update 10/27 Not anemic, high-normal MCV, unlikely to be iron deficient at this time.

## 2016-05-17 NOTE — Assessment & Plan Note (Signed)
Unknown history of hyperlipidemia, prescribed fenofibrate by PCP in [place]. -Request records -Consider d/c fibrate at next visit

## 2016-05-20 NOTE — Progress Notes (Signed)
I saw and evaluated the patient. I personally confirmed the key portions of Dr. Rowe Pavy history and exam and reviewed pertinent patient test results. The assessment, diagnosis, and plan were formulated together and I agree with the documentation in the resident's note.

## 2016-06-09 ENCOUNTER — Inpatient Hospital Stay (HOSPITAL_COMMUNITY)
Admission: EM | Admit: 2016-06-09 | Discharge: 2016-06-14 | DRG: 378 | Disposition: A | Discharge status: Home / Self Care | Payer: Medicare Other | Attending: Oncology | Admitting: Oncology

## 2016-06-09 ENCOUNTER — Encounter (HOSPITAL_COMMUNITY): Payer: Self-pay

## 2016-06-09 DIAGNOSIS — K644 Residual hemorrhoidal skin tags: Secondary | ICD-10-CM | POA: Diagnosis present

## 2016-06-09 DIAGNOSIS — K922 Gastrointestinal hemorrhage, unspecified: Secondary | ICD-10-CM | POA: Diagnosis not present

## 2016-06-09 DIAGNOSIS — D62 Acute posthemorrhagic anemia: Secondary | ICD-10-CM | POA: Diagnosis not present

## 2016-06-09 DIAGNOSIS — I482 Chronic atrial fibrillation: Secondary | ICD-10-CM | POA: Diagnosis present

## 2016-06-09 DIAGNOSIS — I2510 Atherosclerotic heart disease of native coronary artery without angina pectoris: Secondary | ICD-10-CM | POA: Diagnosis not present

## 2016-06-09 DIAGNOSIS — E785 Hyperlipidemia, unspecified: Secondary | ICD-10-CM | POA: Diagnosis present

## 2016-06-09 DIAGNOSIS — E876 Hypokalemia: Secondary | ICD-10-CM | POA: Diagnosis present

## 2016-06-09 DIAGNOSIS — M1990 Unspecified osteoarthritis, unspecified site: Secondary | ICD-10-CM | POA: Diagnosis present

## 2016-06-09 DIAGNOSIS — K625 Hemorrhage of anus and rectum: Secondary | ICD-10-CM

## 2016-06-09 DIAGNOSIS — K5731 Diverticulosis of large intestine without perforation or abscess with bleeding: Principal | ICD-10-CM | POA: Diagnosis present

## 2016-06-09 DIAGNOSIS — K2971 Gastritis, unspecified, with bleeding: Secondary | ICD-10-CM | POA: Diagnosis not present

## 2016-06-09 DIAGNOSIS — I480 Paroxysmal atrial fibrillation: Secondary | ICD-10-CM | POA: Diagnosis present

## 2016-06-09 DIAGNOSIS — D509 Iron deficiency anemia, unspecified: Secondary | ICD-10-CM | POA: Diagnosis present

## 2016-06-09 DIAGNOSIS — Z96643 Presence of artificial hip joint, bilateral: Secondary | ICD-10-CM | POA: Diagnosis present

## 2016-06-09 DIAGNOSIS — Z7901 Long term (current) use of anticoagulants: Secondary | ICD-10-CM | POA: Diagnosis not present

## 2016-06-09 DIAGNOSIS — E119 Type 2 diabetes mellitus without complications: Secondary | ICD-10-CM

## 2016-06-09 DIAGNOSIS — I959 Hypotension, unspecified: Secondary | ICD-10-CM | POA: Diagnosis present

## 2016-06-09 DIAGNOSIS — R634 Abnormal weight loss: Secondary | ICD-10-CM | POA: Diagnosis not present

## 2016-06-09 DIAGNOSIS — Z950 Presence of cardiac pacemaker: Secondary | ICD-10-CM | POA: Diagnosis not present

## 2016-06-09 DIAGNOSIS — I472 Ventricular tachycardia: Secondary | ICD-10-CM | POA: Diagnosis present

## 2016-06-09 DIAGNOSIS — K5733 Diverticulitis of large intestine without perforation or abscess with bleeding: Secondary | ICD-10-CM | POA: Diagnosis present

## 2016-06-09 DIAGNOSIS — I10 Essential (primary) hypertension: Secondary | ICD-10-CM | POA: Diagnosis present

## 2016-06-09 DIAGNOSIS — I350 Nonrheumatic aortic (valve) stenosis: Secondary | ICD-10-CM

## 2016-06-09 HISTORY — DX: Unspecified osteoarthritis, unspecified site: M19.90

## 2016-06-09 HISTORY — DX: Essential (primary) hypertension: I10

## 2016-06-09 LAB — COMPREHENSIVE METABOLIC PANEL
ALBUMIN: 3.1 g/dL — AB (ref 3.5–5.0)
ALT: 12 U/L — ABNORMAL LOW (ref 14–54)
ANION GAP: 8 (ref 5–15)
AST: 28 U/L (ref 15–41)
Alkaline Phosphatase: 58 U/L (ref 38–126)
BUN: 16 mg/dL (ref 6–20)
CO2: 24 mmol/L (ref 22–32)
Calcium: 9.6 mg/dL (ref 8.9–10.3)
Chloride: 107 mmol/L (ref 101–111)
Creatinine, Ser: 0.75 mg/dL (ref 0.44–1.00)
GFR calc Af Amer: >60 mL/min (ref >60)
GFR calc non Af Amer: >60 mL/min (ref >60)
GLUCOSE: 143 mg/dL — AB (ref 65–99)
POTASSIUM: 3.3 mmol/L — AB (ref 3.5–5.1)
SODIUM: 139 mmol/L (ref 135–145)
TOTAL PROTEIN: 7 g/dL (ref 6.5–8.1)
Total Bilirubin: 0.4 mg/dL (ref 0.3–1.2)

## 2016-06-09 LAB — CBC
HCT: 36.5 % (ref 36.0–46.0)
HEMOGLOBIN: 12.1 g/dL (ref 12.0–15.0)
MCH: 31.8 pg (ref 26.0–34.0)
MCHC: 33.2 g/dL (ref 30.0–36.0)
MCV: 95.8 fL (ref 78.0–100.0)
Platelets: 415 10*3/uL — ABNORMAL HIGH (ref 150–400)
RBC: 3.81 MIL/uL — AB (ref 3.87–5.11)
RDW: 12.9 % (ref 11.5–15.5)
WBC: 9.3 10*3/uL (ref 4.0–10.5)

## 2016-06-09 LAB — TYPE AND SCREEN
ABO/RH(D): A NEG
ANTIBODY SCREEN: NEGATIVE

## 2016-06-09 LAB — POC OCCULT BLOOD, ED: Fecal Occult Bld: POSITIVE — AB

## 2016-06-09 MED ORDER — POTASSIUM CHLORIDE CRYS ER 20 MEQ PO TBCR
40.0000 meq | EXTENDED_RELEASE_TABLET | Freq: Once | ORAL | Status: AC
  Administered 2016-06-09: 40 meq via ORAL
  Filled 2016-06-09: qty 2

## 2016-06-09 MED ORDER — SODIUM CHLORIDE 0.9 % IV BOLUS (SEPSIS)
500.0000 mL | Freq: Once | INTRAVENOUS | Status: AC
  Administered 2016-06-09: 500 mL via INTRAVENOUS

## 2016-06-09 NOTE — H&P (Signed)
Date: 06/09/2016               Patient Name:  Jill Short MRN: [id number]  DOB: [birth year] Age / Sex: 82 y.o., female   PCP: Florinda Marker, MD         Medical Service: Internal Medicine Teaching Service         Attending Physician: Dr. Annia Belt, MD    First Contact: Dr. [name], MD Pager: [phone]  Second Contact: Dr. Burgess Estelle, MD Pager: [phone]       After Hours (After 5p/  First Contact Pager: [phone]  weekends / holidays): Second Contact Pager: [phone]   Chief Complaint: blood in toilet  History of Present Illness:  This is an 82 y/o F with Mhx significant for Atrial Fibrillation (a/c with Eliquis 2.5 mg BID, rate control with Toprol XL 25mg , diet-controlled DM2, iron deficiency anemia on supplementation and history of diverticular bleed who presents for evaluation of blood in stool. Pt reports that this evening after having a normal bowel movement she saw bright red blood in the toilet. Since that time she has had 2 bloody bowel movements. Denies any fevers, chills, abdominal pain, rectal pain, diarrhea, fatigue, shortness of breath or lightheadedness. Took Eliquis this evening after seeing blood. Reports this happened once before 12/2014, diagnosed as a diverticular bleed, which required transfusion of 1 unit and a colonoscopy which demonstrated severe diverticulosis with no active bleeding.   In the ED VSS (Tempurature 97.9, pulse 84, respiration 16 and saturating 96% on room air, blood pressure 125/68). Rectal exam significant for external hemorrhoids, stool in rectal vault, and bright red blood per rectum. Fecal occult blood test positive. CBC without anemia, hemoglobin of 12.1 and without leukocytosis. CMP shows slight hypokalemia at 3.3. GI was consulted on the ED who recommended hospital admission, AND will see patient in morning.  Meds:  Current Meds  Medication Sig  . apixaban (ELIQUIS) 2.5 MG TABS tablet Take 2.5 mg by mouth 2 (two) times  daily.  . calcium-vitamin D (OSCAL WITH D) 500-200 MG-UNIT per tablet Take 1 tablet by mouth 2 (two) times daily. (Patient taking differently: Take 1 tablet by mouth daily with breakfast. )  . cholecalciferol (VITAMIN D) 1000 UNITS tablet Take 1,000 Units by mouth daily.  . Cyanocobalamin (B-12 PO) Take 1 tablet by mouth daily.  . fenofibrate micronized (LOFIBRA) 200 MG capsule Take 200 mg by mouth daily before breakfast.  . ferrous sulfate 325 (65 FE) MG tablet TAKE ONE TABLET BY MOUTH DAILY WITH BREAKFAST  . Flaxseed, Linseed, (FLAX PO) Take 1,200 mg by mouth daily.  . metoprolol succinate (TOPROL-XL) 25 MG 24 hr tablet Take 25 mg by mouth daily.  . pantoprazole (PROTONIX) 40 MG tablet TAKE ONE TABLET BY MOUTH DAILY  . vitamin C (ASCORBIC ACID) 500 MG tablet Take 500 mg by mouth daily.    Allergies: Allergies as of 06/09/2016  . (No Known Allergies)   Past Medical History:  Diagnosis Date  . Arthritis   . Coronary artery disease   . Diabetes mellitus without complication ([place])   . Hypertension   . Pacemaker    Social History: Has never smoked. Denied any alcohol or drug use. She travels the country with her husband in an RV and is in town visiting her daughter currently. Daughter, [name], present during evaluation. Pleasant and supportive.  Review of Systems: A complete ROS was negative except as per HPI.   Physical Exam: Blood pressure  105/92, pulse 87, temperature 97.9 F (36.6 C), temperature source Oral, resp. rate 16, height 5\' 3"  (1.6 m), weight 129 lb (58.5 kg), SpO2 98 %. General: Alert and frail appearing caucasian woman resting comfortably in bed. In no acute distress. Very pleasant.  HENT: PERLL, EOMI, oropharynx clear. Mucous membranes moist. Conjunctiva without pallor. Cardiovascular: Irregularly irregular, rate approximately 80. 2/6 systolic ejection murmur best heard RUSB.  Pulmonary: CTA BL. No wheezes, crackles or rhonchi. Unlabored breathing.  Abdomen: Soft,  non-tender and non-distended. No guarding or rigidity noted. +bowel sounds Extremities: Nonpitting peripheral edema noted BL LE, sock indentations. Intact distal pulses.  Skin: Warm, dry. Does not appear overtly pale. No jaundice.  Psych: Pleasant. Normal mood & affect. Judgement & insight normal.   CBC Latest Ref Rng & Units 06/09/2016 05/16/2016 01/19/2015  WBC 4.0 - 10.5 K/uL 9.3 7.6 5.9  Hemoglobin 12.0 - 15.0 g/dL 12.1 - 10.6(L)  Hematocrit 36.0 - 46.0 % 36.5 38.9 33.3(L)  Platelets 150 - 400 K/uL 415(H) 317 455(H)   CMP Latest Ref Rng & Units 06/09/2016 05/16/2016 01/06/2015  Glucose 65 - 99 mg/dL 143(H) 85 106(H)  BUN 6 - 20 mg/dL 16 25 <5(L)  Creatinine 0.44 - 1.00 mg/dL 0.75 0.73 0.72  Sodium 135 - 145 mmol/L 139 142 141  Potassium 3.5 - 5.1 mmol/L 3.3(L) 4.3 3.7  Chloride 101 - 111 mmol/L 107 103 109  CO2 22 - 32 mmol/L 24 26 27   Calcium 8.9 - 10.3 mg/dL 9.6 9.2 8.8(L)  Total Protein 6.5 - 8.1 g/dL 7.0 7.1 -  Total Bilirubin 0.3 - 1.2 mg/dL 0.4 0.3 -  Alkaline Phos 38 - 126 U/L 58 57 -  AST 15 - 41 U/L 28 19 -  ALT 14 - 54 U/L 12(L) 10 -   +FOBT  Assessment & Plan by Problem: 82 y/o female on a/c with Eliquis with hx of diverticular bleed presents with painless hematochezia. Has had several "large" BRB bowel movements since onset this evening. Denies any other symptoms including fevers, chills, abdominal pain, rectal pain, SOB, fatigue, HA.   Active Problems:   GI bleed   DM (diabetes mellitus) (HCC)   Atrial fibrillation (HCC)   Diverticulosis of colon with hemorrhage   Anemia, iron deficiency   Hyperlipemia   Aortic stenosis   Coronary artery disease  Lower GI bleeding, Confirmed Diverticulosis Hematochezia confirmed by rectal examination. Bleeding source could be secondary to extensive diverticulosis (noted on recent colonoscopy 6/16), or unlikely external hemorrhoids. Admitted 6/16 with same complaint and was seen by Dr. Oletta Lamas of GI who performed an EGD/Colo  which did not demonstrate overt bleeding.  Did however demonstrate severe diverticulosis. Was d/c home with instructions to take Miralax indefinitely and does not appear patient still taking. Last took Eliquis this evening. Hb stable at 12.1 and denies syx of symptomatic anemia.  -GI has been consulted, Dr. Oletta Lamas to see patient -GI recs NPO + Ice chips until evaluated -Type & screen -Morning CBC shows Hb 9.1 -Received 1L ns bolus in ED, patient currently appears hemodynamically stable. Will retain IV access -Monitoring vitals  Atrial Fibrillation A/c with Eliquis 2.5 mg BID and rate control with Toprol XL 25mg  Qdaily. In atrial fibrillation during examination, was rate controlled. CHADSVASC score 5. Will hold Eliquis in setting of GI bleed.  -Continue Toprol XL 25 mg daily  Hyperlipidemia Recently saw cardiologist this year. LDL 60, HDL 44, TG 97.  -Continue Fenofibrate 160mg  daily  Aortic Stenosis Auscultated on examination. Presence of  AS confirmed on recent echo 9/17: EF 50-55%, moderate AS, mild AI, Mild TR and MR.  Type 2 Diabetes Diet controlled. Most recent HbA1c 4.4% 12/2014.   CAD S/p Pacemaker Cardiologist appointment 7/17.   Hx of Iron Deficiency Anemia Takes iron supplementation at home. Not anemic presently and MCV normal.  -Held Iron supl  Diet: NPO except ice chips. HH once cleared by GI IVF: Received 1L NS in ED. NSL Code Status: FULL CODE DVT Prophylaxis: SCDs  Dispo: Admit patient to Inpatient with expected length of stay greater than 2 midnights.  SignedEinar Gip, DO 06/09/2016, 10:28 PM  Pager: [phone]

## 2016-06-09 NOTE — ED Notes (Signed)
ED Provider at bedside. 

## 2016-06-09 NOTE — ED Provider Notes (Signed)
[facility] DEPT Provider Note   CSN: [id number] Arrival date & time: 06/09/16  1946     History   Chief Complaint Chief Complaint  Patient presents with  . Rectal Bleeding    HPI Jill Short is a 82 y.o. female.  Jill Short is a 82 y.o. female with h/o arthritis, CAD, HTN, T2DM, pacemaker, afib on eliquis, and diverticulosis presents to ED with complaint of rectal bleeding. Pt reports she has had two bowel movements of bright red blood this evening. She has a history of diverticular disease and is on eliquis. She denies fever, fatigue, CP, SOB, abdominal pain, N/V, dizziness, lightheadedness, or syncope. She was admitted in June 2016 for diverticular bleed and followed by [facility] GI while in hospital. During her hospital stay she had received 2 units of PRBC. Most of her follow ups since have been with internal medicine. PCP Dr. Quay Burow.       Past Medical History:  Diagnosis Date  . Arthritis   . Coronary artery disease   . Diabetes mellitus without complication ([place])   . Hypertension   . Pacemaker     Patient Active Problem List   Diagnosis Date Noted  . Anemia, iron deficiency 05/17/2016  . Hyperlipemia 05/17/2016  . Pacemaker 01/02/2015  . DM (diabetes mellitus) ([place]) 01/02/2015  . Atrial fibrillation ([place]) 01/02/2015  . Anticoagulated by anticoagulation treatment 01/02/2015  . Diverticulosis of colon with hemorrhage 01/02/2015  . GI bleed 01/01/2015    Past Surgical History:  Procedure Laterality Date  . COLONOSCOPY N/A 01/06/2015   Procedure: COLONOSCOPY;  Surgeon: Laurence Spates, MD;  Location: [facility] ENDOSCOPY;  Service: Endoscopy;  Laterality: N/A;  . ESOPHAGOGASTRODUODENOSCOPY N/A 01/13/2015   Procedure: ESOPHAGOGASTRODUODENOSCOPY (EGD);  Surgeon: Laurence Spates, MD;  Location: [facility] ENDOSCOPY;  Service: Endoscopy;  Laterality: N/A;  . TOTAL HIP ARTHROPLASTY Bilateral     OB History    No data available       Home Medications    Prior to  Admission medications   Medication Sig Start Date End Date Taking? Authorizing Provider  apixaban (ELIQUIS) 2.5 MG TABS tablet Take 2.5 mg by mouth 2 (two) times daily.   Yes Historical Provider, MD  calcium-vitamin D (OSCAL WITH D) 500-200 MG-UNIT per tablet Take 1 tablet by mouth 2 (two) times daily. Patient taking differently: Take 1 tablet by mouth daily with breakfast.  01/15/15  Yes Jones Bales, MD  cholecalciferol (VITAMIN D) 1000 UNITS tablet Take 1,000 Units by mouth daily.   Yes Historical Provider, MD  Cyanocobalamin (B-12 PO) Take 1 tablet by mouth daily.   Yes Historical Provider, MD  fenofibrate micronized (LOFIBRA) 200 MG capsule Take 200 mg by mouth daily before breakfast.   Yes Historical Provider, MD  ferrous sulfate 325 (65 FE) MG tablet TAKE ONE TABLET BY MOUTH DAILY WITH BREAKFAST 03/08/15  Yes Alexa R Burns, MD  Flaxseed, Linseed, (FLAX PO) Take 1,200 mg by mouth daily.   Yes Historical Provider, MD  metoprolol succinate (TOPROL-XL) 25 MG 24 hr tablet Take 25 mg by mouth daily.   Yes Historical Provider, MD  pantoprazole (PROTONIX) 40 MG tablet TAKE ONE TABLET BY MOUTH DAILY 02/16/15  Yes Alexa Angela Burke, MD  vitamin C (ASCORBIC ACID) 500 MG tablet Take 500 mg by mouth daily.   Yes Historical Provider, MD    Family History History reviewed. No pertinent family history.  Social History Social History  Substance Use Topics  . Smoking status: Never Smoker  . Smokeless tobacco:  Never Used  . Alcohol use No     Allergies   Patient has no known allergies.   Review of Systems Review of Systems  Constitutional: Negative for chills, diaphoresis and fever.  HENT: Negative for trouble swallowing.   Eyes: Negative for visual disturbance.  Respiratory: Negative for shortness of breath.   Cardiovascular: Negative for chest pain.  Gastrointestinal: Positive for blood in stool. Negative for abdominal pain, nausea and vomiting.  Genitourinary: Negative for dysuria and  hematuria.  Musculoskeletal: Negative for back pain.  Skin: Negative for rash.  Neurological: Negative for dizziness, syncope, light-headedness and headaches.     Physical Exam Updated Vital Signs BP 125/68 (BP Location: Right Arm)   Pulse 84   Temp 97.9 F (36.6 C) (Oral)   Resp 16   Ht 5\' 3"  (1.6 m)   Wt 58.5 kg   SpO2 96%   BMI 22.85 kg/m   Physical Exam  Constitutional: She appears well-developed and well-nourished. No distress.  HENT:  Head: Normocephalic and atraumatic.  Mouth/Throat: Oropharynx is clear and moist. No oropharyngeal exudate.  Eyes: Conjunctivae and EOM are normal. Pupils are equal, round, and reactive to light. Right eye exhibits no discharge. Left eye exhibits no discharge. No scleral icterus.  Neck: Normal range of motion and phonation normal. Neck supple. No neck rigidity. Normal range of motion present.  Cardiovascular: Normal rate, regular rhythm, normal heart sounds and intact distal pulses.   No murmur heard. Pulmonary/Chest: Effort normal and breath sounds normal. No stridor. No respiratory distress. She has no wheezes. She has no rales.  Abdominal: Soft. Bowel sounds are normal. She exhibits no distension. There is no tenderness. There is no rigidity, no rebound, no guarding and no CVA tenderness.  Genitourinary:  Genitourinary Comments: Chaperone present for duration of exam. External hemorrhoids noted. Rectal tone is normal. No tenderness. No masses palpated. Stool noted in rectal vault. Bright red blood per rectum.  Musculoskeletal: Normal range of motion.  Lymphadenopathy:    She has no cervical adenopathy.  Neurological: She is alert. She is not disoriented. Coordination and gait normal. GCS eye subscore is 4. GCS verbal subscore is 5. GCS motor subscore is 6.  Skin: Skin is warm and dry. She is not diaphoretic.  Psychiatric: She has a normal mood and affect. Her behavior is normal.     ED Treatments / Results  Labs (all labs ordered are  listed, but only abnormal results are displayed) Labs Reviewed  COMPREHENSIVE METABOLIC PANEL - Abnormal; Notable for the following:       Result Value   Potassium 3.3 (*)    Glucose, Bld 143 (*)    Albumin 3.1 (*)    ALT 12 (*)    All other components within normal limits  CBC - Abnormal; Notable for the following:    RBC 3.81 (*)    Platelets 415 (*)    All other components within normal limits  POC OCCULT BLOOD, ED - Abnormal; Notable for the following:    Fecal Occult Bld POSITIVE (*)    All other components within normal limits  TYPE AND SCREEN    EKG  EKG Interpretation None       Radiology No results found.  Procedures Procedures (including critical care time)  Medications Ordered in ED Medications - No data to display   Initial Impression / Assessment and Plan / ED Course  I have reviewed the triage vital signs and the nursing notes.  Pertinent labs & imaging results  that were available during my care of the patient were reviewed by me and considered in my medical decision making (see chart for details).  Clinical Course     Patient presents to ED with complaint of rectal bleeding. Patient is afebrile and non-toxic appearing in NAD. VSS. Bright red blood per rectum on rectal exam. Abdomen is soft, non-distended, and non-tender. +hemoccult. Concern for diverticular bleed. hgb stable at this time. Mild hypokalemia. Pt typed and screened. Will consult GI regarding diverticular bleed. Pt will need hospitalist admission for continued management. Shared visit with Dr. Tamera Punt agrees with plan.   9:30 PM: Spoke with Dr. Oletta Lamas for GI, greatly appreciate his time. Recommend hospital admission. Ice chips for now. Will see patient. Consult to internal medicine placed.   10:02 PM: Spoke with Internal Medicine, greatly appreciate his time and input. Agree to admit patient for further management of GI bleed likely secondary to diverticulosis. Thank you for your continued  care of this patient.   Final Clinical Impressions(s) / ED Diagnoses   Final diagnoses:  Lower GI bleed    New Prescriptions New Prescriptions   No medications on file     Roxanna Mew, PA-C 06/09/16 2204    Malvin Johns, MD 06/09/16 [phone]

## 2016-06-09 NOTE — ED Triage Notes (Signed)
Onset tonight pt went to bathroom passed small amount of stool and large amount bright red blood.  No other symptoms noted.

## 2016-06-10 DIAGNOSIS — R634 Abnormal weight loss: Secondary | ICD-10-CM

## 2016-06-10 DIAGNOSIS — I2510 Atherosclerotic heart disease of native coronary artery without angina pectoris: Secondary | ICD-10-CM

## 2016-06-10 DIAGNOSIS — Z7901 Long term (current) use of anticoagulants: Secondary | ICD-10-CM

## 2016-06-10 DIAGNOSIS — D509 Iron deficiency anemia, unspecified: Secondary | ICD-10-CM

## 2016-06-10 DIAGNOSIS — Z950 Presence of cardiac pacemaker: Secondary | ICD-10-CM

## 2016-06-10 DIAGNOSIS — K5731 Diverticulosis of large intestine without perforation or abscess with bleeding: Principal | ICD-10-CM

## 2016-06-10 DIAGNOSIS — Z79899 Other long term (current) drug therapy: Secondary | ICD-10-CM

## 2016-06-10 DIAGNOSIS — E785 Hyperlipidemia, unspecified: Secondary | ICD-10-CM

## 2016-06-10 DIAGNOSIS — I350 Nonrheumatic aortic (valve) stenosis: Secondary | ICD-10-CM

## 2016-06-10 LAB — CBC
HCT: 29.3 % — ABNORMAL LOW (ref 36.0–46.0)
HEMATOCRIT: 31.9 % — AB (ref 36.0–46.0)
HEMOGLOBIN: 9.8 g/dL — AB (ref 12.0–15.0)
Hemoglobin: 10.6 g/dL — ABNORMAL LOW (ref 12.0–15.0)
MCH: 31.9 pg (ref 26.0–34.0)
MCH: 31.9 pg (ref 26.0–34.0)
MCHC: 33.4 g/dL (ref 30.0–36.0)
MCHC: 33.2 g/dL (ref 30.0–36.0)
MCV: 95.4 fL (ref 78.0–100.0)
MCV: 96.1 fL (ref 78.0–100.0)
PLATELETS: 345 10*3/uL (ref 150–400)
Platelets: 314 10*3/uL (ref 150–400)
RBC: 3.07 MIL/uL — AB (ref 3.87–5.11)
RBC: 3.32 MIL/uL — AB (ref 3.87–5.11)
RDW: 13.2 % (ref 11.5–15.5)
RDW: 13.1 % (ref 11.5–15.5)
WBC: 9.4 10*3/uL (ref 4.0–10.5)
WBC: 9.4 10*3/uL (ref 4.0–10.5)

## 2016-06-10 LAB — BASIC METABOLIC PANEL
ANION GAP: 6 (ref 5–15)
BUN: 15 mg/dL (ref 6–20)
CALCIUM: 8.8 mg/dL — AB (ref 8.9–10.3)
CO2: 25 mmol/L (ref 22–32)
Chloride: 109 mmol/L (ref 101–111)
Creatinine, Ser: 0.6 mg/dL (ref 0.44–1.00)
Glucose, Bld: 88 mg/dL (ref 65–99)
Potassium: 3.7 mmol/L (ref 3.5–5.1)
Sodium: 140 mmol/L (ref 135–145)

## 2016-06-10 MED ORDER — FENOFIBRATE 160 MG PO TABS
160.0000 mg | ORAL_TABLET | Freq: Every day | ORAL | Status: DC
  Administered 2016-06-10 – 2016-06-14 (×5): 160 mg via ORAL
  Filled 2016-06-10 (×6): qty 1

## 2016-06-10 MED ORDER — SODIUM CHLORIDE 0.9% FLUSH
3.0000 mL | Freq: Two times a day (BID) | INTRAVENOUS | Status: DC
  Administered 2016-06-10 – 2016-06-14 (×7): 3 mL via INTRAVENOUS

## 2016-06-10 MED ORDER — METOPROLOL SUCCINATE ER 25 MG PO TB24
25.0000 mg | ORAL_TABLET | Freq: Every day | ORAL | Status: DC
  Administered 2016-06-10 – 2016-06-14 (×5): 25 mg via ORAL
  Filled 2016-06-10 (×5): qty 1

## 2016-06-10 MED ORDER — PANTOPRAZOLE SODIUM 40 MG PO TBEC
40.0000 mg | DELAYED_RELEASE_TABLET | Freq: Every day | ORAL | Status: DC
  Administered 2016-06-10 – 2016-06-14 (×5): 40 mg via ORAL
  Filled 2016-06-10 (×5): qty 1

## 2016-06-10 NOTE — Progress Notes (Signed)
At around midnight, 06/10/16, pt arrived floor in the company of daughter and ED NT. Pt was admitted and settled in the room, cardiac monitoring initiated. MD (internal medicine teaching service) was notified of pt's arrival on the floor.  Pt's pacemaker was reported to be inappropriately pacing by the CMT. MD was notified for follow up. Pt is doing well. Will continue to monitor.

## 2016-06-10 NOTE — Care Management Note (Addendum)
Case Management Note  Patient Details  Name: Jill Short MRN: [id number] Date of Birth: 05-30-1931  Subjective/Objective:         Pt present with rectal bleeding, hx significant for Atrial Fibrillation, DM2, iron deficiency anemia, and history of diverticular bleed. From home with daughter and husband.Pt and husband both reside with daughter, [name]. Pt states independent with ADL's and no DME usage PTA.    [name] (Daughter)     [phone]       PCP; [name]  Action/Plan: GI following.... Return to home when medically stable. CM to f/u with disposition needs.  Expected Discharge Date:                  Expected Discharge Plan:  Home/Self Care  In-House Referral:     Discharge planning Services  CM Consult  Post Acute Care Choice:    Choice offered to:     DME Arranged:    DME Agency:     HH Arranged:    HH Agency:     Status of Service:  In process, will continue to follow  If discussed at Long Length of Stay Meetings, dates discussed:    Additional Comments:  Sharin Mons, RN 06/10/2016, 10:50 AM

## 2016-06-10 NOTE — Consult Note (Signed)
Subjective:   HPI  The patient is an 82 year old female who presents to the emergency department because of rectal bleeding. She is being admitted to the hospital for this. Her hemoglobin on presentation was found to be 9 point. The patient had a couple episodes of bright red rectal bleeding before coming to the hospital. She does have a history of significant diverticulosis of the colon and has had a prior diverticular bleed. She underwent colonoscopy last year by Dr. Oletta Lamas showing significant diverticulosis of the colon. No other lesions were seen to explain bleeding. She does have a history of atrial fibrillation and has been on Eliquis. She did take this yesterday.  Review of Systems Denies chest pain or shortness of breath.  Past Medical History:  Diagnosis Date  . Arthritis   . Coronary artery disease   . Diabetes mellitus without complication ([place])   . Hypertension   . Pacemaker    Past Surgical History:  Procedure Laterality Date  . COLONOSCOPY N/A 01/06/2015   Procedure: COLONOSCOPY;  Surgeon: Laurence Spates, MD;  Location: [facility] ENDOSCOPY;  Service: Endoscopy;  Laterality: N/A;  . ESOPHAGOGASTRODUODENOSCOPY N/A 01/13/2015   Procedure: ESOPHAGOGASTRODUODENOSCOPY (EGD);  Surgeon: Laurence Spates, MD;  Location: [facility] ENDOSCOPY;  Service: Endoscopy;  Laterality: N/A;  . TOTAL HIP ARTHROPLASTY Bilateral    Social History   Social History  . Marital status: Married    Spouse name: N/A  . Number of children: N/A  . Years of education: N/A   Occupational History  . Not on file.   Social History Main Topics  . Smoking status: Never Smoker  . Smokeless tobacco: Never Used  . Alcohol use No  . Drug use: No  . Sexual activity: Not on file   Other Topics Concern  . Not on file   Social History Narrative  . No narrative on file   family history is not on file.  Current Facility-Administered Medications:  .  fenofibrate tablet 160 mg, 160 mg, Oral, Daily, Jule Ser, DO .   metoprolol succinate (TOPROL-XL) 24 hr tablet 25 mg, 25 mg, Oral, Daily, Jule Ser, DO .  pantoprazole (PROTONIX) EC tablet 40 mg, 40 mg, Oral, Daily, Jule Ser, DO .  sodium chloride flush (NS) 0.9 % injection 3 mL, 3 mL, Intravenous, Q12H, Jule Ser, DO, 3 mL at 06/10/16 0034 No Known Allergies   Objective:     BP 139/72 (BP Location: Right Arm)   Pulse 81   Temp 97.5 F (36.4 C) (Oral)   Resp 18   Ht 5\' 3"  (1.6 m)   Wt 58.5 kg (129 lb)   SpO2 99%   BMI 22.85 kg/m   She is in no acute distress  Nonicteric  Heart irregular rhythm  Lungs clear  Abdomen: Bowel sounds present, soft, nontender  Laboratory No components found for: D1    Assessment:     Lower GI bleed secondary to diverticulosis.      Plan:     I would recommend continued observation and medical management with supportive care. Most of the time these diverticular bleeds will stop spontaneously without any specific intervention. If she continues to have further significant bleeding I would recommend a nuclear medicine GI bleeding scan as the next diagnostic step and if positive proceed to angiography with embolization. If the bleeding stops spontaneously no intervention will be needed. I do not think she needs another colonoscopy. We will follow. Lab Results  Component Value Date   HGB 9.8 (L) 06/10/2016  HGB 12.1 06/09/2016   HGB 10.6 (L) 01/19/2015   HCT 29.3 (L) 06/10/2016   HCT 36.5 06/09/2016   HCT 38.9 05/16/2016   HCT 33.3 (L) 01/19/2015   ALKPHOS 58 06/09/2016   ALKPHOS 57 05/16/2016   ALKPHOS 26 (L) 01/01/2015   AST 28 06/09/2016   AST 19 05/16/2016   AST 31 01/01/2015   ALT 12 (L) 06/09/2016   ALT 10 05/16/2016   ALT 15 01/01/2015

## 2016-06-10 NOTE — Progress Notes (Signed)
Subjective: Sleepy this morning but conversational when roused. Reported passing painless BM around 430 that looked like clotted blood but none since. No complaints this morning.   Objective: Vital signs in last 24 hours: Vitals:   06/09/16 2200 06/09/16 2245 06/10/16 0007 06/10/16 0519  BP: 105/92 118/70 119/62 139/72  Pulse: 87 80 81 81  Resp:  15 18 18   Temp:  99.1 F (37.3 C) 98.1 F (36.7 C) 97.5 F (36.4 C)  TempSrc:  Oral Oral Oral  SpO2: 98% 95% 99% 99%  Weight:      Height:        Intake/Output Summary (Last 24 hours) at 06/10/16 0746 Last data filed at 06/09/16 2339  Gross per 24 hour  Intake              500 ml  Output                0 ml  Net              500 ml   Weight 129 lbs (prev 134 in 12/2014)  Physical Exam General: Alert, frail elderly woman resting comfortably in bed, no  distress. Conversational and pleasant HENT: PERLL, EOMI, oropharynx clear. Mucous membranes mildly dry Cardiovascular: RRR. 2/6 systolic ejection murmur, otherwise no rubs or gallop Pulmonary: CTA BL. Normal work of breathing Abdomen: Soft, BS+, non-tender and non-distended. No guarding or rigidity noted. Extremities: Trace peripheral edema noted BLE, 2+ distal pulses.  Skin: Warm, dry, intact Psych: Positive affect  Labs / Imaging / Procedures: CBC Latest Ref Rng & Units 06/10/2016 06/09/2016 05/16/2016  WBC 4.0 - 10.5 K/uL 9.4 9.3 7.6  Hemoglobin 12.0 - 15.0 g/dL 9.8(L) 12.1 -  Hematocrit 36.0 - 46.0 % 29.3(L) 36.5 38.9  Platelets 150 - 400 K/uL 314 415(H) 317   BMP Latest Ref Rng & Units 06/10/2016 06/09/2016 05/16/2016  Glucose 65 - 99 mg/dL 88 143(H) 85  BUN 6 - 20 mg/dL 15 16 25   Creatinine 0.44 - 1.00 mg/dL 0.60 0.75 0.73  BUN/Creat Ratio 12 - 28 - - 34(H)  Sodium 135 - 145 mmol/L 140 139 142  Potassium 3.5 - 5.1 mmol/L 3.7 3.3(L) 4.3  Chloride 101 - 111 mmol/L 109 107 103  CO2 22 - 32 mmol/L 25 24 26   Calcium 8.9 - 10.3 mg/dL 8.8(L) 9.6 9.2   No results  found.  Assessment/Plan: 82 y/o female on a/c with Eliquis with hx of diverticular bleed presents with painless hematochezia. Has had several "large" BRB bowel movements since onset this evening. Denies any other symptoms including fevers, chills, abdominal pain, rectal pain, SOB, fatigue, HA.   Lower GI bleeding, Confirmed Diverticulosis Hematochezia confirmed by rectal examination. Bleeding source could be secondary to extensive diverticulosis (noted on recent colonoscopy 6/16), or unlikely external hemorrhoids. Admitted 6/16 with same complaint and was seen by Dr. Oletta Lamas of GI who performed an EGD/Colo which did not demonstrate overt bleeding.  Did however demonstrate severe diverticulosis. Was d/c home with instructions to take Miralax indefinitely and does not appear patient still taking. Last took Eliquis this evening. Hb stable at 12.1 and denies syx of symptomatic anemia.  -Appreciate GI recs:   - Medical management with supportive care, likely to stop spontaneously  - if bleed continues will pursue NM scan, if positive then angiography/embolization  - no current indication for colonoscopy -Morning CBC shows Hb 9.8, trend CBC BID -Received 1L ns bolus in ED, patient currently appears hemodynamically stable. Will retain IV  access -Monitoring vitals  Atrial Fibrillation A/c with Eliquis 2.5 mg BID and rate control with Toprol XL 25mg  Qdaily. In atrial fibrillation during examination, was rate controlled. CHADSVASC score 5. Will hold Eliquis in setting of GI bleed.  -Continue Toprol XL 25 mg daily  Hyperlipidemia Recently saw cardiologist this year. LDL 60, HDL 44, TG 97.  -Continue Fenofibrate 160mg  daily  Aortic Stenosis Auscultated on examination. Presence of AS confirmed on recent echo 9/17: EF 50-55%, moderate AS, mild AI, Mild TR and MR.   Type 2 Diabetes Diet controlled. Most recent HbA1c 4.4% 12/2014.   CAD S/p Pacemaker Cardiologist appointment 7/17.   Hx of Iron  Deficiency Anemia Takes iron supplementation at home. Not anemic presently and MCV normal.  -Held Iron supl  Diet: HH IVF: Received 1L NS in ED. NSL Code Status: FULL CODE DVT Prophylaxis: SCDs  Dispo: Anticipated discharge in approximately 1-2 day(s).   LOS: 1 day   [name], MD 06/10/2016, 7:46 AM Pager: [phone]

## 2016-06-11 ENCOUNTER — Encounter (HOSPITAL_COMMUNITY): Payer: Self-pay | Admitting: General Surgery

## 2016-06-11 ENCOUNTER — Inpatient Hospital Stay (HOSPITAL_COMMUNITY): Payer: Medicare Other

## 2016-06-11 LAB — CBC
HCT: 28.7 % — ABNORMAL LOW (ref 36.0–46.0)
HCT: 26.9 % — ABNORMAL LOW (ref 36.0–46.0)
HEMATOCRIT: 29.9 % — AB (ref 36.0–46.0)
Hemoglobin: 9.8 g/dL — ABNORMAL LOW (ref 12.0–15.0)
Hemoglobin: 9.4 g/dL — ABNORMAL LOW (ref 12.0–15.0)
Hemoglobin: 8.8 g/dL — ABNORMAL LOW (ref 12.0–15.0)
MCH: 31.5 pg (ref 26.0–34.0)
MCH: 31.8 pg (ref 26.0–34.0)
MCH: 31.5 pg (ref 26.0–34.0)
MCHC: 32.8 g/dL (ref 30.0–36.0)
MCHC: 32.8 g/dL (ref 30.0–36.0)
MCHC: 32.7 g/dL (ref 30.0–36.0)
MCV: 96.1 fL (ref 78.0–100.0)
MCV: 97 fL (ref 78.0–100.0)
MCV: 96.4 fL (ref 78.0–100.0)
Platelets: 345 10*3/uL (ref 150–400)
Platelets: 350 10*3/uL (ref 150–400)
Platelets: 325 10*3/uL (ref 150–400)
RBC: 3.11 MIL/uL — ABNORMAL LOW (ref 3.87–5.11)
RBC: 2.96 MIL/uL — ABNORMAL LOW (ref 3.87–5.11)
RBC: 2.79 MIL/uL — AB (ref 3.87–5.11)
RDW: 13 % (ref 11.5–15.5)
RDW: 13 % (ref 11.5–15.5)
RDW: 13.1 % (ref 11.5–15.5)
WBC: 8.8 10*3/uL (ref 4.0–10.5)
WBC: 8.9 10*3/uL (ref 4.0–10.5)
WBC: 9.1 10*3/uL (ref 4.0–10.5)

## 2016-06-11 LAB — PROTIME-INR
INR: 1.3
PROTHROMBIN TIME: 16.3 s — AB (ref 11.4–15.2)

## 2016-06-11 LAB — MRSA PCR SCREENING: MRSA BY PCR: NEGATIVE

## 2016-06-11 MED ORDER — SODIUM CHLORIDE 0.9 % IV SOLN: INTRAVENOUS | Status: AC

## 2016-06-11 MED ORDER — TECHNETIUM TC 99M-LABELED RED BLOOD CELLS IV KIT
25.0000 | PACK | Freq: Once | INTRAVENOUS | Status: AC | PRN
  Administered 2016-06-11: 25 via INTRAVENOUS

## 2016-06-11 MED ORDER — SODIUM CHLORIDE 0.9 % IV SOLN
INTRAVENOUS | Status: DC
  Administered 2016-06-11: 1000 mL via INTRAVENOUS

## 2016-06-11 NOTE — Consult Note (Signed)
Reason for Consult:  Lower GI bleed Referring Physician: [name] (Resident) PCP:  Florinda Marker, MD   Jill Short is an 83 y.o. female.  HPI: Pt presented to the Ed on 06/09/16 with painless rectal bleeding.  She had a similar episode of this last year.  Colonoscopy by Dr. Laurence Spates 01/06/15 showing pan diverticulosis, with no bleeding site.  Work up here in the hospital shows she is afebrile, VSS,  H/H on admit was 12.1/36.5.  She is down to 9.4/28.7 this AM.  INR is 1.30.  She was seen and admitted by Medicine, her Eliquis has been stopped.  She was seen by Dr. Penelope Coop yesterday, and he recommends that if she bleeds again she should undergo GI bleeding scan, and if positive emobolization.  Today she is bleeding again, bleeding scan is ordered and we are ask to see.    Her past medical history is significant for Atrial Fibrillation on Eliquis, DM2, iron deficiency anemia, with a diverticular bleed last year that stopped after anticoagulation was held.       Past Medical History:  Diagnosis Date  . Arthritis   . Coronary artery disease   . Diabetes mellitus without complication ([place])   . Hypertension   . Pacemaker     Past Surgical History:  Procedure Laterality Date  . COLONOSCOPY N/A 01/06/2015   Procedure: COLONOSCOPY;  Surgeon: Laurence Spates, MD;  Location: [facility] ENDOSCOPY;  Service: Endoscopy;  Laterality: N/A;  . ESOPHAGOGASTRODUODENOSCOPY N/A 01/13/2015   Procedure: ESOPHAGOGASTRODUODENOSCOPY (EGD);  Surgeon: Laurence Spates, MD;  Location: [facility] ENDOSCOPY;  Service: Endoscopy;  Laterality: N/A;  . TOTAL HIP ARTHROPLASTY Bilateral     History reviewed. No pertinent family history.  Social History:  reports that she has never smoked. She has never used smokeless tobacco. She reports that she does not drink alcohol or use drugs.  Allergies: No Known Allergies  Prior to Admission medications   Medication Sig Start Date End Date Taking? Authorizing Provider  apixaban  (ELIQUIS) 2.5 MG TABS tablet Take 2.5 mg by mouth 2 (two) times daily.   Yes Historical Provider, MD  calcium-vitamin D (OSCAL WITH D) 500-200 MG-UNIT per tablet Take 1 tablet by mouth 2 (two) times daily. Patient taking differently: Take 1 tablet by mouth daily with breakfast.  01/15/15  Yes Jones Bales, MD  cholecalciferol (VITAMIN D) 1000 UNITS tablet Take 1,000 Units by mouth daily.   Yes Historical Provider, MD  Cyanocobalamin (B-12 PO) Take 1 tablet by mouth daily.   Yes Historical Provider, MD  fenofibrate micronized (LOFIBRA) 200 MG capsule Take 200 mg by mouth daily before breakfast.   Yes Historical Provider, MD  ferrous sulfate 325 (65 FE) MG tablet TAKE ONE TABLET BY MOUTH DAILY WITH BREAKFAST 03/08/15  Yes Alexa R Burns, MD  Flaxseed, Linseed, (FLAX PO) Take 1,200 mg by mouth daily.   Yes Historical Provider, MD  metoprolol succinate (TOPROL-XL) 25 MG 24 hr tablet Take 25 mg by mouth daily.   Yes Historical Provider, MD  pantoprazole (PROTONIX) 40 MG tablet TAKE ONE TABLET BY MOUTH DAILY 02/16/15  Yes Alexa Angela Burke, MD  vitamin C (ASCORBIC ACID) 500 MG tablet Take 500 mg by mouth daily.   Yes Historical Provider, MD     Results for orders placed or performed during the hospital encounter of 06/09/16 (from the past 48 hour(s))  Comprehensive metabolic panel     Status: Abnormal   Collection Time: 06/09/16  8:01 PM  Result Value Ref  Range   Sodium 139 135 - 145 mmol/L   Potassium 3.3 (L) 3.5 - 5.1 mmol/L   Chloride 107 101 - 111 mmol/L   CO2 24 22 - 32 mmol/L   Glucose, Bld 143 (H) 65 - 99 mg/dL   BUN 16 6 - 20 mg/dL   Creatinine, Ser 0.75 0.44 - 1.00 mg/dL   Calcium 9.6 8.9 - 10.3 mg/dL   Total Protein 7.0 6.5 - 8.1 g/dL   Albumin 3.1 (L) 3.5 - 5.0 g/dL   AST 28 15 - 41 U/L   ALT 12 (L) 14 - 54 U/L   Alkaline Phosphatase 58 38 - 126 U/L   Total Bilirubin 0.4 0.3 - 1.2 mg/dL   GFR calc non Af Amer >60 >60 mL/min   GFR calc Af Amer >60 >60 mL/min    Comment: (NOTE) The  eGFR has been calculated using the CKD EPI equation. This calculation has not been validated in all clinical situations. eGFR's persistently <60 mL/min signify possible Chronic Kidney Disease.    Anion gap 8 5 - 15  CBC     Status: Abnormal   Collection Time: 06/09/16  8:01 PM  Result Value Ref Range   WBC 9.3 4.0 - 10.5 K/uL   RBC 3.81 (L) 3.87 - 5.11 MIL/uL   Hemoglobin 12.1 12.0 - 15.0 g/dL   HCT 36.5 36.0 - 46.0 %   MCV 95.8 78.0 - 100.0 fL   MCH 31.8 26.0 - 34.0 pg   MCHC 33.2 30.0 - 36.0 g/dL   RDW 12.9 11.5 - 15.5 %   Platelets 415 (H) 150 - 400 K/uL  POC occult blood, ED     Status: Abnormal   Collection Time: 06/09/16  9:07 PM  Result Value Ref Range   Fecal Occult Bld POSITIVE (A) NEGATIVE  Type and screen [place]     Status: None   Collection Time: 06/09/16  9:11 PM  Result Value Ref Range   ABO/RH(D) A NEG    Antibody Screen NEG    Sample Expiration 77/09/4033   Basic metabolic panel     Status: Abnormal   Collection Time: 06/10/16  4:30 AM  Result Value Ref Range   Sodium 140 135 - 145 mmol/L   Potassium 3.7 3.5 - 5.1 mmol/L   Chloride 109 101 - 111 mmol/L   CO2 25 22 - 32 mmol/L   Glucose, Bld 88 65 - 99 mg/dL   BUN 15 6 - 20 mg/dL   Creatinine, Ser 0.60 0.44 - 1.00 mg/dL   Calcium 8.8 (L) 8.9 - 10.3 mg/dL   GFR calc non Af Amer >60 >60 mL/min   GFR calc Af Amer >60 >60 mL/min    Comment: (NOTE) The eGFR has been calculated using the CKD EPI equation. This calculation has not been validated in all clinical situations. eGFR's persistently <60 mL/min signify possible Chronic Kidney Disease.    Anion gap 6 5 - 15  CBC     Status: Abnormal   Collection Time: 06/10/16  4:30 AM  Result Value Ref Range   WBC 9.4 4.0 - 10.5 K/uL   RBC 3.07 (L) 3.87 - 5.11 MIL/uL   Hemoglobin 9.8 (L) 12.0 - 15.0 g/dL   HCT 29.3 (L) 36.0 - 46.0 %   MCV 95.4 78.0 - 100.0 fL   MCH 31.9 26.0 - 34.0 pg   MCHC 33.4 30.0 - 36.0 g/dL   RDW 13.2 11.5 - 15.5 %  Platelets 314 150 - 400 K/uL  CBC     Status: Abnormal   Collection Time: 06/10/16  4:07 PM  Result Value Ref Range   WBC 9.4 4.0 - 10.5 K/uL   RBC 3.32 (L) 3.87 - 5.11 MIL/uL   Hemoglobin 10.6 (L) 12.0 - 15.0 g/dL   HCT 31.9 (L) 36.0 - 46.0 %   MCV 96.1 78.0 - 100.0 fL   MCH 31.9 26.0 - 34.0 pg   MCHC 33.2 30.0 - 36.0 g/dL   RDW 13.1 11.5 - 15.5 %   Platelets 345 150 - 400 K/uL  CBC     Status: Abnormal   Collection Time: 06/11/16  4:59 AM  Result Value Ref Range   WBC 8.8 4.0 - 10.5 K/uL   RBC 3.11 (L) 3.87 - 5.11 MIL/uL   Hemoglobin 9.8 (L) 12.0 - 15.0 g/dL   HCT 29.9 (L) 36.0 - 46.0 %   MCV 96.1 78.0 - 100.0 fL   MCH 31.5 26.0 - 34.0 pg   MCHC 32.8 30.0 - 36.0 g/dL   RDW 13.0 11.5 - 15.5 %   Platelets 345 150 - 400 K/uL  CBC     Status: Abnormal   Collection Time: 06/11/16 10:00 AM  Result Value Ref Range   WBC 8.9 4.0 - 10.5 K/uL   RBC 2.96 (L) 3.87 - 5.11 MIL/uL   Hemoglobin 9.4 (L) 12.0 - 15.0 g/dL   HCT 28.7 (L) 36.0 - 46.0 %   MCV 97.0 78.0 - 100.0 fL   MCH 31.8 26.0 - 34.0 pg   MCHC 32.8 30.0 - 36.0 g/dL   RDW 13.0 11.5 - 15.5 %   Platelets 350 150 - 400 K/uL  Protime-INR     Status: Abnormal   Collection Time: 06/11/16 10:11 AM  Result Value Ref Range   Prothrombin Time 16.3 (H) 11.4 - 15.2 seconds   INR 1.30     No results found.  Review of Systems  Constitutional: Negative.   HENT: Negative.   Eyes: Negative.   Respiratory: Negative.   Cardiovascular: Negative.   Gastrointestinal: Positive for blood in stool and melena. Negative for abdominal pain, constipation, diarrhea, heartburn, nausea and vomiting.  Genitourinary: Negative.   Musculoskeletal: Negative.   Skin: Negative.   Neurological: Negative.   Endo/Heme/Allergies: Negative for environmental allergies and polydipsia. Bruises/bleeds easily.  Psychiatric/Behavioral: Negative.    Blood pressure 101/76, pulse 84, temperature 98.1 F (36.7 C), temperature source Oral, resp. rate 17,  height 5' 3"  (1.6 m), weight 58.5 kg (129 lb), SpO2 100 %. Physical Exam  Constitutional: She is oriented to person, place, and time. No distress.  Elderly WF reading in BED no distress, HR 80-90's currently.  She reports she is not bleeding  BP 101/76 (BP Location: Right Arm)   Pulse 84   Temp 98.1 F (36.7 C) (Oral)   Resp 17   Ht 5' 3"  (1.6 m)   Wt 58.5 kg (129 lb)   SpO2 100%   BMI 22.85 kg/m   HENT:  Head: Normocephalic and atraumatic.  Mouth/Throat: No oropharyngeal exudate.  Eyes: Right eye exhibits no discharge. Left eye exhibits no discharge. No scleral icterus.  Neck: Normal range of motion. Neck supple. No JVD present. No tracheal deviation present. No thyromegaly present.  Cardiovascular: Normal rate, regular rhythm, normal heart sounds and intact distal pulses.   No murmur heard. Respiratory: Effort normal and breath sounds normal. No respiratory distress. She has no wheezes. She has no rales. She  exhibits no tenderness.  GI: Soft. Bowel sounds are normal. She exhibits no distension and no mass. There is no tenderness. There is no rebound and no guarding.  Genitourinary:  Genitourinary Comments: currently no bleeding  Musculoskeletal: She exhibits no edema or tenderness.  Lymphadenopathy:    She has no cervical adenopathy.  Neurological: She is alert and oriented to person, place, and time. A cranial nerve deficit is present.  Skin: Skin is warm and dry. No rash noted. She is not diaphoretic. No erythema. No pallor.  Psychiatric: She has a normal mood and affect. Her behavior is normal. Judgment and thought content normal.    Assessment/Plan: Recurrent Lower GI bleed Pan diverticulosis Atrial fibrillation, on Eliquis, currently of this Medication AODM Anemia  Plan:  For bleeding scan, Dr. Rosendo Gros has reviewed.  If bleeding is found he would agree with embolization.  If she came to surgery without a location she would most likely require a total colectomy with her  extensive diverticulosis.  Will follow with you and continue to hold anticoagulation.     Jill Short 06/11/2016, 1:03 PM

## 2016-06-11 NOTE — Progress Notes (Signed)
[facility] Progress Note  Subjective: The patient states that she passed a large bloody bowel movement this morning. Otherwise she feels fine.  Objective: Vital signs in last 24 hours: Temp:  [97.9 F (36.6 C)-98.7 F (37.1 C)] 98.1 F (36.7 C) (11/21 0855) Pulse Rate:  [70-86] 76 (11/21 0855) Resp:  [14-20] 18 (11/21 0855) BP: (106-121)/(47-60) 108/57 (11/21 0855) SpO2:  [98 %-100 %] 98 % (11/21 0537) Weight change:    PE:  No distress  Heart regular rhythm  Lungs clear  Abdomen soft and nontender  Lab Results: Results for orders placed or performed during the hospital encounter of 06/09/16 (from the past 24 hour(s))  CBC     Status: Abnormal   Collection Time: 06/10/16  4:07 PM  Result Value Ref Range   WBC 9.4 4.0 - 10.5 K/uL   RBC 3.32 (L) 3.87 - 5.11 MIL/uL   Hemoglobin 10.6 (L) 12.0 - 15.0 g/dL   HCT 31.9 (L) 36.0 - 46.0 %   MCV 96.1 78.0 - 100.0 fL   MCH 31.9 26.0 - 34.0 pg   MCHC 33.2 30.0 - 36.0 g/dL   RDW 13.1 11.5 - 15.5 %   Platelets 345 150 - 400 K/uL  CBC     Status: Abnormal   Collection Time: 06/11/16  4:59 AM  Result Value Ref Range   WBC 8.8 4.0 - 10.5 K/uL   RBC 3.11 (L) 3.87 - 5.11 MIL/uL   Hemoglobin 9.8 (L) 12.0 - 15.0 g/dL   HCT 29.9 (L) 36.0 - 46.0 %   MCV 96.1 78.0 - 100.0 fL   MCH 31.5 26.0 - 34.0 pg   MCHC 32.8 30.0 - 36.0 g/dL   RDW 13.0 11.5 - 15.5 %   Platelets 345 150 - 400 K/uL    Studies/Results: No results found.    Assessment: Diverticular bleed  Plan:   Proceed with nuclear medicine GI bleeding scan. If it is positive would recommend angiography with embolization. If negative continue supportive care.    Cassell Clement 06/11/2016, 9:47 AM  Pager: [phone] If no answer or after 5 PM call [phone]

## 2016-06-11 NOTE — Progress Notes (Addendum)
Subjective: Jill Short unfortunately was actively having a bright red bloody bowel movement when we visited her this morning. She suddenly became incontinent of bloody stool which trailed from her hospital bed to the bathroom. She reports that she had no such BMs yesterday. She denies any symptoms of pain or shortness of breath.   Objective: Vital signs in last 24 hours: Vitals:   06/10/16 0519 06/10/16 1502 06/10/16 2208 06/11/16 0537  BP: 139/72 121/60 (!) 114/55 (!) 106/47  Pulse: 81 86 75 70  Resp: 18 14 20 18   Temp: 97.5 F (36.4 C) 97.9 F (36.6 C) 98.7 F (37.1 C) 98.5 F (36.9 C)  TempSrc: Oral Oral Oral   SpO2: 99% 98% 100% 98%  Weight:      Height:        Intake/Output Summary (Last 24 hours) at 06/11/16 0634 Last data filed at 06/11/16 0335  Gross per 24 hour  Intake               60 ml  Output              900 ml  Net             -840 ml   Weight 129 lbs (prev 134 in 12/2014)  Physical Exam General: Alert, frail elderly woman resting in bed, blood visible on bed sheet, mild distress, pale HENT: PERLL, mucous membranes mildly dry Cardiovascular: Mildly tachycardic rate, regular rhythm, 2/6 systolic ejection murmur, otherwise no rubs or gallop Pulmonary: CTA BL. Normal work of breathing Abdomen: Soft, BS+, non-tender and non-distended. No guarding or rigidity noted. Extremities: Trace peripheral edema noted BLE, 1+ distal pulses.  Skin: Warm, dry, intact Psych: Positive affect  Labs / Imaging / Procedures: CBC Latest Ref Rng & Units 06/11/2016 06/10/2016 06/10/2016  WBC 4.0 - 10.5 K/uL 8.8 9.4 9.4  Hemoglobin 12.0 - 15.0 g/dL 9.8(L) 10.6(L) 9.8(L)  Hematocrit 36.0 - 46.0 % 29.9(L) 31.9(L) 29.3(L)  Platelets 150 - 400 K/uL 345 345 314   BMP Latest Ref Rng & Units 06/10/2016 06/09/2016 05/16/2016  Glucose 65 - 99 mg/dL 88 143(H) 85  BUN 6 - 20 mg/dL 15 16 25   Creatinine 0.44 - 1.00 mg/dL 0.60 0.75 0.73  BUN/Creat Ratio 12 - 28 - - 34(H)  Sodium 135 -  145 mmol/L 140 139 142  Potassium 3.5 - 5.1 mmol/L 3.7 3.3(L) 4.3  Chloride 101 - 111 mmol/L 109 107 103  CO2 22 - 32 mmol/L 25 24 26   Calcium 8.9 - 10.3 mg/dL 8.8(L) 9.6 9.2   No results found.  Assessment/Plan: 82 y/o female on a/c with Eliquis with hx of diverticular bleed presents with painless hematochezia. Has had several "large" BRB bowel movements since onset this evening. Denies any other symptoms including fevers, chills, abdominal pain, rectal pain, SOB, fatigue, HA.   Lower GI bleeding with acute blood loss anemia, likely from previously documented sigmoid diverticulosis  Hematochezia confirmed by rectal examination. Bleeding source likely from extensive diverticulosis (noted on recent colonoscopy 12/2014), or unlikely external hemorrhoids. Admitted 12/2014 with same complaint and was seen by Dr. Oletta Lamas of GI who performed an EGD/Colo which did not demonstrate overt bleeding.  Did however demonstrate severe diverticulosis. Was d/c home with instructions to take Miralax indefinitely and does not appear patient still taking. Last took Eliquis this evening. Hb stable at 12.1 and denies syx of symptomatic anemia. BRBPR returned on AM 11/21, developing hypotension to 100s/50s -Appreciate GI recs:   - NM bleeding  scan  - Consult IR to eval for angiogram/embolization   - Consult to gen surg for eval need for urgent colectomy  - no currently planned for colonoscopy -Morning CBC shows Hb 9.8, recheck CBC at 10 AM, trend BID - Developing hypotension, 100s/50s, start IVF 100cc/hr - Transfer to SDU for closer monitoring - NPO sips/chips  Atrial Fibrillation A/c with Eliquis 2.5 mg BID and rate control with Toprol XL 25mg  Qdaily. In atrial fibrillation during examination, was rate controlled. CHADSVASC score 5. Will hold Eliquis in setting of GI bleed.  -Continue Toprol XL 25 mg daily  Hyperlipidemia Recently saw cardiologist this year. LDL 60, HDL 44, TG 97.  -Continue Fenofibrate 160mg   daily  Aortic Stenosis Auscultated on examination. Presence of AS confirmed on recent echo 9/17: EF 50-55%, moderate AS, mild AI, Mild TR and MR.   Type 2 Diabetes Diet controlled. Most recent HbA1c 4.4% 12/2014.   CAD S/p Pacemaker Cardiologist appointment 7/17.   Hx of Iron Deficiency Anemia Takes iron supplementation at home. Not anemic presently and MCV normal.  -Held Iron supl  Diet: NPO sips/chips IVF: Received 1L NS in ED. NSL Code Status: FULL CODE DVT Prophylaxis: SCDs  Dispo: Anticipated discharge in approximately 1-2 day(s).   LOS: 2 days   [name], MD 06/11/2016, 6:34 AM Pager: [phone]

## 2016-06-11 NOTE — Progress Notes (Signed)
Medicine attending: I personally examined this patient this morning and I concur with the evaluation of mass and plan as recorded by resident physician Dr. [name]. Unfortunately, the patient started to have a profuse rectal hemorrhage again this morning while we were on rounds. We have alerted gastroenterology who has been following the patient with [place]. I have also requested  a general surgery consultation in the event that we need to move to an emergency sigmoid colectomy. I personally have not found tagged red cell scans very helpful in the past. However we will proceed with the same per GI recommendation. Vital signs at time of our exam: Blood pressure 108/57, pulse 76 and regular, Abdomen remains soft, nontender. Bright red blood on the bed sheets and on the floor. Hemoglobin 9.4 at 10 AM but expect it will fall at time of next measurement. Anticoagulation has been on hold since admission and current INR is 1.3.

## 2016-06-11 NOTE — Consult Note (Signed)
Chief Complaint: LGI bleed  Referring Physician:Dr. Murriel Hopper  Supervising Physician: Marybelle Killings  Patient Status: [name] - In-pt  HPI: Jill Short is an 82 y.o. female with a history of a. Fib who is in eliquis who began have BRBPR on Saturday.  Due to its persistence, she presented to the [facility] where she was admitted on Sunday and her eliquis held at that time.  She did not have any other bloody BMs until this morning.  She has a history of a GI bleed from last year, for which it stopped on its own.  However, she has had a colonoscopy that revealed pandiverticulosis and is likely the source.  Due to her new BMs this morning, she is getting ready to go down for a NM bleeding scan and if this is positive they would like for [place] to perform a angiogram with embolization.  Past Medical History:  Past Medical History:  Diagnosis Date  . Arthritis   . Coronary artery disease   . Diabetes mellitus without complication ([place])   . Hypertension   . Pacemaker     Past Surgical History:  Past Surgical History:  Procedure Laterality Date  . COLONOSCOPY N/A 01/06/2015   Procedure: COLONOSCOPY;  Surgeon: Laurence Spates, MD;  Location: [facility] ENDOSCOPY;  Service: Endoscopy;  Laterality: N/A;  . ESOPHAGOGASTRODUODENOSCOPY N/A 01/13/2015   Procedure: ESOPHAGOGASTRODUODENOSCOPY (EGD);  Surgeon: Laurence Spates, MD;  Location: [facility] ENDOSCOPY;  Service: Endoscopy;  Laterality: N/A;  . TOTAL HIP ARTHROPLASTY Bilateral     Family History: History reviewed. No pertinent family history.  Social History:  reports that she has never smoked. She has never used smokeless tobacco. She reports that she does not drink alcohol or use drugs.  Allergies: No Known Allergies  Medications: Medications reviewed in epic  Please HPI for pertinent positives, otherwise complete 10 system ROS negative.  Mallampati Score: MD Evaluation Airway: WNL Heart: WNL Abdomen: WNL Chest/ Lungs: WNL ASA  Classification:  3 Mallampati/Airway Score: Two  Physical Exam: BP (!) 108/57 (BP Location: Right Arm)   Pulse 76   Temp 98.1 F (36.7 C) (Oral)   Resp 18   Ht 5' 3" (1.6 m)   Wt 129 lb (58.5 kg)   SpO2 98%   BMI 22.85 kg/m  Body mass index is 22.85 kg/m. General: pleasant, elderly white female who is laying in bed in NAD HEENT: head is normocephalic, atraumatic.  Sclera are noninjected.  PERRL. Glasses in place  Ears and nose without any masses or lesions.  Mouth is pink and moist Heart: irregularly irregular.  Normal s1,s2. No obvious murmurs, gallops, or rubs noted.  Palpable radial and pedal pulses bilaterally Lungs: CTAB, no wheezes, rhonchi, or rales noted.  Respiratory effort nonlabored Abd: soft, NT, ND, +BS, no masses, hernias, or organomegaly Psych: A&Ox3 with an appropriate affect.   Labs: Results for orders placed or performed during the hospital encounter of 06/09/16 (from the past 48 hour(s))  Comprehensive metabolic panel     Status: Abnormal   Collection Time: 06/09/16  8:01 PM  Result Value Ref Range   Sodium 139 135 - 145 mmol/L   Potassium 3.3 (L) 3.5 - 5.1 mmol/L   Chloride 107 101 - 111 mmol/L   CO2 24 22 - 32 mmol/L   Glucose, Bld 143 (H) 65 - 99 mg/dL   BUN 16 6 - 20 mg/dL   Creatinine, Ser 0.75 0.44 - 1.00 mg/dL   Calcium 9.6 8.9 - 10.3 mg/dL  Total Protein 7.0 6.5 - 8.1 g/dL   Albumin 3.1 (L) 3.5 - 5.0 g/dL   AST 28 15 - 41 U/L   ALT 12 (L) 14 - 54 U/L   Alkaline Phosphatase 58 38 - 126 U/L   Total Bilirubin 0.4 0.3 - 1.2 mg/dL   GFR calc non Af Amer >60 >60 mL/min   GFR calc Af Amer >60 >60 mL/min    Comment: (NOTE) The eGFR has been calculated using the CKD EPI equation. This calculation has not been validated in all clinical situations. eGFR's persistently <60 mL/min signify possible Chronic Kidney Disease.    Anion gap 8 5 - 15  CBC     Status: Abnormal   Collection Time: 06/09/16  8:01 PM  Result Value Ref Range   WBC 9.3 4.0 - 10.5 K/uL   RBC 3.81  (L) 3.87 - 5.11 MIL/uL   Hemoglobin 12.1 12.0 - 15.0 g/dL   HCT 36.5 36.0 - 46.0 %   MCV 95.8 78.0 - 100.0 fL   MCH 31.8 26.0 - 34.0 pg   MCHC 33.2 30.0 - 36.0 g/dL   RDW 12.9 11.5 - 15.5 %   Platelets 415 (H) 150 - 400 K/uL  POC occult blood, ED     Status: Abnormal   Collection Time: 06/09/16  9:07 PM  Result Value Ref Range   Fecal Occult Bld POSITIVE (A) NEGATIVE  Type and screen [place]     Status: None   Collection Time: 06/09/16  9:11 PM  Result Value Ref Range   ABO/RH(D) A NEG    Antibody Screen NEG    Sample Expiration 02/63/7858   Basic metabolic panel     Status: Abnormal   Collection Time: 06/10/16  4:30 AM  Result Value Ref Range   Sodium 140 135 - 145 mmol/L   Potassium 3.7 3.5 - 5.1 mmol/L   Chloride 109 101 - 111 mmol/L   CO2 25 22 - 32 mmol/L   Glucose, Bld 88 65 - 99 mg/dL   BUN 15 6 - 20 mg/dL   Creatinine, Ser 0.60 0.44 - 1.00 mg/dL   Calcium 8.8 (L) 8.9 - 10.3 mg/dL   GFR calc non Af Amer >60 >60 mL/min   GFR calc Af Amer >60 >60 mL/min    Comment: (NOTE) The eGFR has been calculated using the CKD EPI equation. This calculation has not been validated in all clinical situations. eGFR's persistently <60 mL/min signify possible Chronic Kidney Disease.    Anion gap 6 5 - 15  CBC     Status: Abnormal   Collection Time: 06/10/16  4:30 AM  Result Value Ref Range   WBC 9.4 4.0 - 10.5 K/uL   RBC 3.07 (L) 3.87 - 5.11 MIL/uL   Hemoglobin 9.8 (L) 12.0 - 15.0 g/dL   HCT 29.3 (L) 36.0 - 46.0 %   MCV 95.4 78.0 - 100.0 fL   MCH 31.9 26.0 - 34.0 pg   MCHC 33.4 30.0 - 36.0 g/dL   RDW 13.2 11.5 - 15.5 %   Platelets 314 150 - 400 K/uL  CBC     Status: Abnormal   Collection Time: 06/10/16  4:07 PM  Result Value Ref Range   WBC 9.4 4.0 - 10.5 K/uL   RBC 3.32 (L) 3.87 - 5.11 MIL/uL   Hemoglobin 10.6 (L) 12.0 - 15.0 g/dL   HCT 31.9 (L) 36.0 - 46.0 %   MCV 96.1 78.0 - 100.0 fL  MCH 31.9 26.0 - 34.0 pg   MCHC 33.2 30.0 - 36.0 g/dL   RDW  13.1 11.5 - 15.5 %   Platelets 345 150 - 400 K/uL  CBC     Status: Abnormal   Collection Time: 06/11/16  4:59 AM  Result Value Ref Range   WBC 8.8 4.0 - 10.5 K/uL   RBC 3.11 (L) 3.87 - 5.11 MIL/uL   Hemoglobin 9.8 (L) 12.0 - 15.0 g/dL   HCT 29.9 (L) 36.0 - 46.0 %   MCV 96.1 78.0 - 100.0 fL   MCH 31.5 26.0 - 34.0 pg   MCHC 32.8 30.0 - 36.0 g/dL   RDW 13.0 11.5 - 15.5 %   Platelets 345 150 - 400 K/uL    Imaging: No results found.  Assessment/Plan 1. GI bleed, likely diverticular in origin -the patient has a nuclear medicine bleeding scan ordered.  If this is positive, then we can proceed with a mesenteric arteriogram with possible embolization.  If her scan is negative, then she will return to her room for further observation and no intervention would be warranted. -cont NPO for now -I will try to contact her daughter at the patient's request. -I did discuss the possibility with the patient that if her NM scan was + that by the time she were to have her arteriogram that her bleeding could cease and we may do her arteriogram, but not be able to intervention.  She understood. -Risks and Benefits discussed with the patient including, but not limited to bleeding, infection, vascular injury or contrast induced renal failure. All of the patient's questions were answered, patient is agreeable to proceed. Consent signed and in chart.   Thank you for this interesting consult.  I greatly enjoyed meeting Gavyn Espaillat and look forward to participating in their care.  A copy of this report was sent to the requesting provider on this date.  Electronically Signed: OSBORNE,KELLY E 06/11/2016, 10:10 AM   I spent a total of 40 Minutes    in face to face in clinical consultation, greater than 50% of which was counseling/coordinating care for GI bleed   

## 2016-06-11 NOTE — Progress Notes (Signed)
Patient was transferred to 3S15 by MD order. Patient was transported to 3S by nurse and nurse tech. Report was given to the nurse who received the patient.

## 2016-06-12 LAB — CBC
HCT: 25.9 % — ABNORMAL LOW (ref 36.0–46.0)
HCT: 27.2 % — ABNORMAL LOW (ref 36.0–46.0)
Hemoglobin: 8.6 g/dL — ABNORMAL LOW (ref 12.0–15.0)
Hemoglobin: 8.9 g/dL — ABNORMAL LOW (ref 12.0–15.0)
MCH: 32.1 pg (ref 26.0–34.0)
MCH: 31.6 pg (ref 26.0–34.0)
MCHC: 33.2 g/dL (ref 30.0–36.0)
MCHC: 32.7 g/dL (ref 30.0–36.0)
MCV: 96.6 fL (ref 78.0–100.0)
MCV: 96.5 fL (ref 78.0–100.0)
PLATELETS: 378 10*3/uL (ref 150–400)
Platelets: 319 10*3/uL (ref 150–400)
RBC: 2.68 MIL/uL — ABNORMAL LOW (ref 3.87–5.11)
RBC: 2.82 MIL/uL — ABNORMAL LOW (ref 3.87–5.11)
RDW: 13 % (ref 11.5–15.5)
RDW: 13 % (ref 11.5–15.5)
WBC: 9 10*3/uL (ref 4.0–10.5)
WBC: 13.9 10*3/uL — AB (ref 4.0–10.5)

## 2016-06-12 LAB — BASIC METABOLIC PANEL
Anion gap: 10 (ref 5–15)
BUN: 17 mg/dL (ref 6–20)
CALCIUM: 8.5 mg/dL — AB (ref 8.9–10.3)
CO2: 22 mmol/L (ref 22–32)
Chloride: 108 mmol/L (ref 101–111)
Creatinine, Ser: 0.71 mg/dL (ref 0.44–1.00)
GFR calc non Af Amer: >60 mL/min (ref >60)
Glucose, Bld: 70 mg/dL (ref 65–99)
Potassium: 3.3 mmol/L — ABNORMAL LOW (ref 3.5–5.1)
SODIUM: 140 mmol/L (ref 135–145)

## 2016-06-12 MED ORDER — POTASSIUM CHLORIDE CRYS ER 20 MEQ PO TBCR
40.0000 meq | EXTENDED_RELEASE_TABLET | Freq: Once | ORAL | Status: AC
  Administered 2016-06-12: 40 meq via ORAL
  Filled 2016-06-12: qty 2

## 2016-06-12 NOTE — Progress Notes (Signed)
Medicine attending: I examined this patient this morning together with resident physician Dr. [name] and I concur with his evaluation and management plan which we discussed together. She had another episode of significant hematochezia yesterday. She remained hemodynamically stable. Hemoglobin fell from 9.8 to 8.6. But he has again stopped on its own. Surgery not felt indicated at this time. Recommendation is attempted embolization if she rebleeds. Tagged red cell study was negative as anticipated. She had a 2 runs of asymptomatic non-sustained V. tach versus wide complex tachycardia on monitor during the night. She otherwise remains in sinus rhythm. Her underlying arrhythmia was chronic atrial fibrillation but she has been in sinus rhythm since admission. Impression:  Recurrent diverticular bleed 1 year after initial event. Initial stabilization and then recurrent bleed. Bleeding has now stopped for the moment. In view of complex cardiac history and need for chronic anticoagulation, after she needs to be kept in the hospital for at least another 48 hours to make sure that the bleeding will not recur and then make a decision on whether or not to resume anticoagulation.

## 2016-06-12 NOTE — Progress Notes (Signed)
Patient has had a small soft bowel movement. Brown, no evidence of blood noted.

## 2016-06-12 NOTE — Progress Notes (Signed)
@   0524 paged Dr. Danford Bad on-call for IMTS regarding pt's asymptomatic periods of wide complex tachycardia. VSS and resting comfortably. No orders received. Will continue to monitor and will convey to oncoming RN.

## 2016-06-12 NOTE — Progress Notes (Signed)
  Subjective: Pt with no further LGIB.  Nuc med scan results noted  Objective: Vital signs in last 24 hours: Temp:  [97.9 F (36.6 C)-98.9 F (37.2 C)] 98.6 F (37 C) (11/22 0718) Pulse Rate:  [65-95] 83 (11/22 0718) Resp:  [15-21] 18 (11/22 0718) BP: (97-135)/(42-76) 111/58 (11/22 0718) SpO2:  [91 %-100 %] 100 % (11/22 0718) Last BM Date: 06/10/16  Intake/Output from previous day: 11/21 0701 - 11/22 0700 In: 100 [P.O.:100] Out: 150 [Urine:150] Intake/Output this shift: No intake/output data recorded.  General appearance: alert and cooperative GI: soft, non-tender; bowel sounds normal; no masses,  no organomegaly  Lab Results:   Recent Labs  06/11/16 1916 06/12/16 0445  WBC 9.1 9.0  HGB 8.8* 8.6*  HCT 26.9* 25.9*  PLT 325 319   BMET  Recent Labs  06/10/16 0430 06/12/16 0445  NA 140 140  K 3.7 3.3*  CL 109 108  CO2 25 22  GLUCOSE 88 70  BUN 15 17  CREATININE 0.60 0.71  CALCIUM 8.8* 8.5*   PT/INR  Recent Labs  06/11/16 1011  LABPROT 16.3*  INR 1.30   ABG No results for input(s): PHART, HCO3 in the last 72 hours.  Invalid input(s): PCO2, PO2  Studies/Results: Nm Gi Blood Loss  Result Date: 06/11/2016 CLINICAL DATA:  Painless rectal bleeding.  No known injury. EXAM: NUCLEAR MEDICINE GASTROINTESTINAL BLEEDING SCAN TECHNIQUE: Sequential abdominal images were obtained following intravenous administration of Tc-56m labeled red blood cells. RADIOPHARMACEUTICALS:  27 mCi Tc-92m in-vitro labeled red cells. COMPARISON:  Bleeding scan 01/13/2015. FINDINGS: Sequential planar images of the abdomen and pelvis were obtained prepped 2 hours. No evidence of GI bleed is identified. Normal radiotracer activity in urinary bladder is noted. IMPRESSION: Negative for GI bleed. Electronically Signed   By: Inge Rise M.D.   On: 06/11/2016 19:51    Anti-infectives: Anti-infectives    None      Assessment/Plan: Recurrent Lower GI bleed Pan  diverticulosis Atrial fibrillation, on Eliquis, currently of this Medication AODM Anemia  Pt with no localization of LGIB on nuc med scan Hct stable At this point it appears that pt is no longer bleeding, but no recent BM noted per pt. Would not rec total colectomy at this time, and no plans for surgery.  If rebleeds would rec IR embolization Call back with any questions.    LOS: 3 days    Rosario Jacks., [facility] 06/12/2016

## 2016-06-12 NOTE — Progress Notes (Addendum)
Subjective: Hb stable this morning. NM scan showed no active bleeding however due to delays was not run until approx 7 hours after her bleeding. Ms. Steinle is feeling fine this morning. She reports no new episodes of BRBPR but had a small amount of blood visible on toilet paper when she wiped this morning, although she has not had a true BM since yesterday. She denies dizziness, abdominal pain, shortness of breath, chest pain - asymptomatic this morning. She is happy to learn that we are restarting her diet.  Brief review of telemetry revealed that she had 2 short runs of V-tach overnight    Once having BMs without blood, safe for transfer back to telemetry  Objective: Vital signs in last 24 hours: Vitals:   06/11/16 1453 06/11/16 1906 06/11/16 2231 06/12/16 0322  BP: 115/75 (!) 135/43 (!) 106/42 112/63  Pulse: 85 65 95 90  Resp: 15 16 17  (!) 21  Temp: 98.3 F (36.8 C) 97.9 F (36.6 C) 98.9 F (37.2 C) 97.9 F (36.6 C)  TempSrc: Oral Oral Oral Oral  SpO2: 100% 91% 97% 100%  Weight:      Height:        Intake/Output Summary (Last 24 hours) at 06/12/16 0716 Last data filed at 06/11/16 [id number]  Gross per 24 hour  Intake              100 ml  Output              150 ml  Net              -50 ml   Weight 129 lbs (prev 134 in 12/2014)   Physical Exam General: Alert, frail elderly woman resting in bed, in no distress HENT: PERLL, mucous membranes mildly dry Cardiovascular: RRR, 2/6 systolic ejection murmur, otherwise no rubs or gallop Pulmonary: CTA BL. Normal work of breathing Abdomen: Soft, BS+, non-tender and non-distended. No guarding or rigidity noted. Extremities: Trace peripheral edema noted BLE, 1+ distal pulses.  Skin: Warm, dry, intact Psych: Positive affect  Labs / Imaging / Procedures: CBC Latest Ref Rng & Units 06/12/2016 06/11/2016 06/11/2016  WBC 4.0 - 10.5 K/uL 9.0 9.1 8.9  Hemoglobin 12.0 - 15.0 g/dL 8.6(L) 8.8(L) 9.4(L)  Hematocrit 36.0 - 46.0 % 25.9(L)  26.9(L) 28.7(L)  Platelets 150 - 400 K/uL 319 325 350   BMP Latest Ref Rng & Units 06/12/2016 06/10/2016 06/09/2016  Glucose 65 - 99 mg/dL 70 88 143(H)  BUN 6 - 20 mg/dL 17 15 16   Creatinine 0.44 - 1.00 mg/dL 0.71 0.60 0.75  BUN/Creat Ratio 12 - 28 - - -  Sodium 135 - 145 mmol/L 140 140 139  Potassium 3.5 - 5.1 mmol/L 3.3(L) 3.7 3.3(L)  Chloride 101 - 111 mmol/L 108 109 107  CO2 22 - 32 mmol/L 22 25 24   Calcium 8.9 - 10.3 mg/dL 8.5(L) 8.8(L) 9.6   Nm Gi Blood Loss  Result Date: 06/11/2016 CLINICAL DATA:  Painless rectal bleeding.  No known injury. EXAM: NUCLEAR MEDICINE GASTROINTESTINAL BLEEDING SCAN TECHNIQUE: Sequential abdominal images were obtained following intravenous administration of Tc-15m labeled red blood cells. RADIOPHARMACEUTICALS:  27 mCi Tc-62m in-vitro labeled red cells. COMPARISON:  Bleeding scan 01/13/2015. FINDINGS: Sequential planar images of the abdomen and pelvis were obtained prepped 2 hours. No evidence of GI bleed is identified. Normal radiotracer activity in urinary bladder is noted. IMPRESSION: Negative for GI bleed. Electronically Signed   By: Inge Rise M.D.   On: 06/11/2016 19:51  Assessment/Plan: 82 y/o female on a/c with Eliquis with hx of diverticular bleed presents with painless hematochezia. Has had several "large" BRB bowel movements since onset this evening. Denies any other symptoms including fevers, chills, abdominal pain, rectal pain, SOB, fatigue, HA.   Lower GI bleeding with acute blood loss anemia, likely from previously documented sigmoid diverticulosis  Hematochezia confirmed by rectal examination. Bleeding source likely from extensive diverticulosis (noted on recent colonoscopy 12/2014), or unlikely external hemorrhoids. Admitted 12/2014 with same complaint and was seen by Dr. Oletta Lamas of GI who performed an EGD/Colo which did not demonstrate overt bleeding.  Did however demonstrate severe diverticulosis. Was d/c home with instructions to  take Miralax indefinitely and does not appear patient still taking. Last took Eliquis this evening. Hb stable at 12.1 and denies syx of symptomatic anemia. BRBPR returned on AM 11/21, developing hypotension to 100s/50s that has improved with IVF. NM scan on 11/21 PM showed no source of bleeding. - Episode of bleeding yesterday morning, none since, Hb 8.6 from 9.8 yesterday morning - Appreciate GI, IR, gen surg recs - Trend CBC this PM and tomorrow morning - Restarting HH diet today - Observe, can consider discharge once 48 hours without bleeding and having stable BMs - If remains stable in SDU can return to telemetry  Atrial Fibrillation A/c with Eliquis 2.5 mg BID and rate control with Toprol XL 25mg  Qdaily. In atrial fibrillation during examination, was rate controlled. CHADSVASC score 5. Will hold Eliquis in setting of GI bleed.  -Continue Toprol XL 25 mg daily - Tele  Hyperlipidemia -  LDL 60, HDL 44, TG 97.  -Continue Fenofibrate 160mg  daily  Aortic Stenosis Auscultated on examination. Presence of AS confirmed on recent echo 9/17: EF 50-55%, moderate AS, mild AI, Mild TR and MR.   Type 2 Diabetes Diet controlled. Most recent HbA1c 4.4% 12/2014.   CAD S/p Pacemaker - Stable, has seen her cardiologist this year  Hx of Iron Deficiency Anemia Takes iron supplementation at home. -Hold Iron supl  Diet: HH diet IVF: Received 1L NS in ED. NSL Code Status: FULL CODE DVT Prophylaxis: SCDs  Dispo: Anticipated discharge in approximately 1-2 day(s).   LOS: 3 days   [name], MD 06/12/2016, 7:16 AM Pager: [phone]

## 2016-06-12 NOTE — Progress Notes (Signed)
Patient ID: Jill Short, female   DOB: 1930/09/28, 82 y.o.   MRN: [id number]  Pt with GI Bleed Mesenteric arteriogram was requested with possible intervention 11/21  NM study performed yesterday afternoon was negative for bleed.  No bleeding since yesterday Hg stable  Call IR if need [place]

## 2016-06-13 DIAGNOSIS — I482 Chronic atrial fibrillation: Secondary | ICD-10-CM

## 2016-06-13 DIAGNOSIS — E119 Type 2 diabetes mellitus without complications: Secondary | ICD-10-CM

## 2016-06-13 DIAGNOSIS — Z7901 Long term (current) use of anticoagulants: Secondary | ICD-10-CM

## 2016-06-13 DIAGNOSIS — K2971 Gastritis, unspecified, with bleeding: Secondary | ICD-10-CM

## 2016-06-13 DIAGNOSIS — D62 Acute posthemorrhagic anemia: Secondary | ICD-10-CM

## 2016-06-13 LAB — CBC
HCT: 29.4 % — ABNORMAL LOW (ref 36.0–46.0)
HEMOGLOBIN: 9.3 g/dL — AB (ref 12.0–15.0)
MCH: 30.4 pg (ref 26.0–34.0)
MCHC: 31.6 g/dL (ref 30.0–36.0)
MCV: 96.1 fL (ref 78.0–100.0)
Platelets: 427 10*3/uL — ABNORMAL HIGH (ref 150–400)
RBC: 3.06 MIL/uL — AB (ref 3.87–5.11)
RDW: 13 % (ref 11.5–15.5)
WBC: 14.4 10*3/uL — ABNORMAL HIGH (ref 4.0–10.5)

## 2016-06-13 MED ORDER — VITAMIN D 1000 UNITS PO TABS
1000.0000 [IU] | ORAL_TABLET | Freq: Every day | ORAL | Status: DC
  Administered 2016-06-13 – 2016-06-14 (×2): 1000 [IU] via ORAL
  Filled 2016-06-13 (×2): qty 1

## 2016-06-13 NOTE — Progress Notes (Signed)
Subjective: Ms. Frock is doing well this morning, but had difficulty sleeping last night. She reports no recurrence of hematochezia and that she had a normal BM last night. She denies any new symptoms or pain. Stable for transfer back to med-surg floor today.  Objective: Vital signs in last 24 hours: Vitals:   06/12/16 2058 06/12/16 2322 06/13/16 0541 06/13/16 0700  BP: 122/68 128/68 (!) 108/53 (!) 113/55  Pulse: 79 91 74 79  Resp: 17 17 (!) 24 (!) 24  Temp: 99 F (37.2 C) 98.3 F (36.8 C) 98 F (36.7 C) 98.1 F (36.7 C)  TempSrc: Oral Oral Oral Oral  SpO2: 100% 98% 99% 94%  Weight:      Height:        Intake/Output Summary (Last 24 hours) at 06/13/16 0753 Last data filed at 06/12/16 1800  Gross per 24 hour  Intake              900 ml  Output              200 ml  Net              700 ml   Weight 129 lbs (prev 134 in 12/2014)   Physical Exam General: Alert, frail elderly woman resting in bed, in no distress HENT: PERLL, moist mucous membranes Cardiovascular: RRR, 2/6 systolic ejection murmur, otherwise no rubs or gallop Pulmonary: CTA BL. Normal work of breathing Abdomen: Soft, BS+, non-tender and non-distended. Extremities: Normal ROM, no edema, 1+ distal pulses.  Skin: Warm, dry, intact Psych: Positive affect  Labs / Imaging / Procedures: CBC Latest Ref Rng & Units 06/12/2016 06/12/2016 06/11/2016  WBC 4.0 - 10.5 K/uL 13.9(H) 9.0 9.1  Hemoglobin 12.0 - 15.0 g/dL 8.9(L) 8.6(L) 8.8(L)  Hematocrit 36.0 - 46.0 % 27.2(L) 25.9(L) 26.9(L)  Platelets 150 - 400 K/uL 378 319 325   BMP Latest Ref Rng & Units 06/12/2016 06/10/2016 06/09/2016  Glucose 65 - 99 mg/dL 70 88 143(H)  BUN 6 - 20 mg/dL 17 15 16   Creatinine 0.44 - 1.00 mg/dL 0.71 0.60 0.75  BUN/Creat Ratio 12 - 28 - - -  Sodium 135 - 145 mmol/L 140 140 139  Potassium 3.5 - 5.1 mmol/L 3.3(L) 3.7 3.3(L)  Chloride 101 - 111 mmol/L 108 109 107  CO2 22 - 32 mmol/L 22 25 24   Calcium 8.9 - 10.3 mg/dL 8.5(L) 8.8(L)  9.6   No results found.  Assessment/Plan: 82 y/o female on a/c with Eliquis with hx of diverticular bleed admitted for painless hematochezia.  Lower GI bleeding with acute blood loss anemia, likely from previously documented sigmoid diverticulosis  Hematochezia confirmed by rectal examination. Bleeding source likely from extensive diverticulosis (noted on recent colonoscopy 12/2014), or unlikely external hemorrhoids. Admitted 12/2014 with same complaint and was seen by Dr. Oletta Lamas of GI who performed an EGD/Colo which did not demonstrate overt bleeding.  Did however demonstrate severe diverticulosis. Was d/c home with instructions to take Miralax indefinitely and does not appear patient still taking. Last took Eliquis 11/19 evening. Hb stable at 12.1 and denies syx of symptomatic anemia. BRBPR returned on AM 11/21, developing hypotension to 100s/50s that has improved with IVF. NM scan on 11/21 PM showed no source of bleeding. Stable since - Hemoglobin stabilized, 9.3 this morning - Appreciate GI recs - Follow AM CBC - Stable for transfer to med-surg room today, if remains stable with no hematochezia recurrence consider DC tomorrow  Atrial Fibrillation A/c with Eliquis 2.5 mg BID  and rate control with Toprol XL 25mg  Qdaily. In atrial fibrillation during examination, was rate controlled. CHADSVASC score 5. Will hold Eliquis in setting of GI bleed.  -Continue Toprol XL 25 mg daily   Hyperlipidemia -  LDL 60, HDL 44, TG 97.  -Continue Fenofibrate 160mg  daily  Aortic Stenosis Auscultated on examination. Presence of AS confirmed on recent echo 9/17: EF 50-55%, moderate AS, mild AI, Mild TR and MR.   Type 2 Diabetes Diet controlled. Most recent HbA1c 4.4% 12/2014.   CAD S/p Pacemaker - Stable, has seen her cardiologist this year  Hx of Iron Deficiency Anemia Takes iron supplementation at home. -Hold Iron supl  Diet: HH diet IVF: Received 1L NS in ED. NSL Code Status: FULL CODE DVT  Prophylaxis: SCDs  Dispo: Anticipated discharge in approximately 1-2 day(s).   LOS: 4 days   [name], MD 06/13/2016, 7:53 AM Pager: [phone]

## 2016-06-13 NOTE — Progress Notes (Signed)
  Date: 06/13/2016  Patient name: Jill Short  Medical record number: [id number]  Date of birth: [birth year]   I have personally seen this patinet and the plan of care was discussed with the house staff. Please see Dr. Durenda Age note for complete details. I concur with his findings.  Patient with GI bleeding on eliquis, bleeding source extensive diverticulosis.  Patient stable for transfer to medical floor today.    Sid Falcon, MD 06/13/2016, 8:52 AM

## 2016-06-13 NOTE — Discharge Summary (Signed)
Name: Jill Short MRN: [id number] DOB: 1930/10/20 82 y.o. PCP: Florinda Marker, MD  Date of Admission: 06/09/2016  8:14 PM Date of Discharge: 06/14/2016 Attending Physician: Annia Belt, MD  Discharge Diagnosis: 1. Lower GI bleed 2. Acute blood loss anemia  Principal Problem:   GI bleed Active Problems:   Diverticulosis of colon with hemorrhage   Acute blood loss anemia   DM (diabetes mellitus) (HCC)   Atrial fibrillation (HCC)   Hyperlipemia   Aortic stenosis   Coronary artery disease   Chronic anticoagulation   Hypokalemia   Discharge Medications:   Medication List    STOP taking these medications   apixaban 2.5 MG Tabs tablet Commonly known as:  ELIQUIS     TAKE these medications   B-12 PO Take 1 tablet by mouth daily.   calcium-vitamin D 500-200 MG-UNIT tablet Commonly known as:  OSCAL WITH D Take 1 tablet by mouth daily with breakfast.   cholecalciferol 1000 units tablet Commonly known as:  VITAMIN D Take 1,000 Units by mouth daily.   fenofibrate micronized 200 MG capsule Commonly known as:  LOFIBRA Take 200 mg by mouth daily before breakfast.   ferrous sulfate 325 (65 FE) MG tablet TAKE ONE TABLET BY MOUTH DAILY WITH BREAKFAST   FLAX PO Take 1,200 mg by mouth daily.   metoprolol succinate 25 MG 24 hr tablet Commonly known as:  TOPROL-XL Take 25 mg by mouth daily.   pantoprazole 40 MG tablet Commonly known as:  PROTONIX TAKE ONE TABLET BY MOUTH DAILY   polycarbophil 625 MG tablet Commonly known as:  FIBERCON Take 1 tablet (625 mg total) by mouth daily.   polyethylene glycol packet Commonly known as:  MIRALAX / GLYCOLAX Take 17 g by mouth daily.   potassium chloride 10 MEQ tablet Commonly known as:  K-DUR Take 2 tablets (20 mEq total) by mouth daily.   vitamin C 500 MG tablet Commonly known as:  ASCORBIC ACID Take 500 mg by mouth daily.       Disposition and follow-up:   Jill Short was discharged from [facility] in Stable condition.  At the hospital follow up visit please address:  1.  Lower GI bleed with acute blood loss anemia - 2 distinct episodes of BRBPR that dropped her Hb from 12.1 to 8.6, history of sigmoid diverticulosis on Eliquis, held Eliquis and Hb stabilized at 9 with resolution of her intermittent hematochezia, holding Eliquis on discharge until follow up, also prescribed daily Miralax and fiber supplement on discharge - assess for recurrence of symptoms or signs of anemia, medication compliance  Atrial fibrillation - CHADSVASc score 5, remained in NSR during hospital stay, well-controlled on metoprolol succinate 25 mg, her Eliquis (2.5mg  BID) was held - assess need to cautiously restart this medication   Hypokalemia - persistent low potassium requiring supplementation throughout hospital stay, prescribed 20 mEq daily on discharge, assess medication compliance and effectiveness  2.  Labs / imaging needed at time of follow-up: CBC, BMP  3.  Pending labs/ test needing follow-up: None  Follow-up Appointments: Follow-up Information    [place]. Schedule an appointment as soon as possible for a visit.   Why:  Schedule a visit for within 2-3 weeks Contact information: [address] [place] [facility] Course by problem list: Principal Problem:   GI bleed Active Problems:   Diverticulosis of colon with hemorrhage  Acute blood loss anemia   DM (diabetes mellitus) (HCC)   Atrial fibrillation (HCC)   Hyperlipemia   Aortic stenosis   Coronary artery disease   Chronic anticoagulation   Hypokalemia   1. Lower GI bleed Jill Short is a 82 y/o woman with PMhx significant for Atrial Fibrillation (a/c with Eliquis 2.5 mg BID, rate control with Toprol XL 25mg ), diet-controlled DM2, iron deficiency anemia on supplementation and history of diverticular bleed who admitted on 11/19 for  evaluation of blood in stool. Pt reported bright red blood in the toilet after a BM that evening, then had 2 bloody bowel movements before presenting to the ER had had unfortunately taken Eliquis that evening. She had a similar event in 12/2014 requiring admission, blood transfusion, and colonoscopy at that time revealed severe sigmoid diverticulosis. She was hemodynamically stable on presentation, bright red blood visible and FOBT+. Initial Hb 12.1 and Eliquis was held. GI was consulted and recommended admission and observation. Hb dropped to 9.8 the day following admission but 11/20 was uneventful. On the morning of 11/21 she again had an episode of large volume BRBPR - Hb dropped to 8.8 and she was transferred to stepdown unit for closer monitoring. IR was consulted to evaluate and recommended NM bleeding scan and continued observation. General surgery was consulted given the potential need for colectomy if bleeding continued. NM bleeding scan on 11/21 found no localized source of bleeding but was taken several hours after the episode. She remained stable and asymptomatic, resumed normal BMs on 11/22. Continued observation revealed Hb stable at approx 9.0 with no further episodes of BRBPR, having regular BMs and started on daily Miralax and fiber supplement. She was deemed stable for discharge with PCP follow up on 11/24.  2. Hypokalemia  - regular BMP checks during hospital stay revealed low potassium that would correct with oral supplementation. She was prescribed 20 mEq daily on discharge.   Discharge Vitals:   BP (!) 98/58 (BP Location: Left Arm)   Pulse 80   Temp 99.2 F (37.3 C) (Oral)   Resp 16   Ht 5\' 3"  (1.6 m)   Wt 58.5 kg (129 lb)   SpO2 99%   BMI 22.85 kg/m   Pertinent Labs, Studies, and Procedures:  CBC Latest Ref Rng & Units 06/14/2016 06/14/2016 06/13/2016  WBC 4.0 - 10.5 K/uL 12.8(H) 11.4(H) 14.4(H)  Hemoglobin 12.0 - 15.0 g/dL 8.8(L) 8.2(L) 9.3(L)  Hematocrit 36.0 - 46.0 %  26.4(L) 24.4(L) 29.4(L)  Platelets 150 - 400 K/uL 414(H) 380 427(H)   BMP Latest Ref Rng & Units 06/14/2016 06/12/2016 06/10/2016  Glucose 65 - 99 mg/dL 88 70 88  BUN 6 - 20 mg/dL 11 17 15   Creatinine 0.44 - 1.00 mg/dL 0.68 0.71 0.60  BUN/Creat Ratio 12 - 28 - - -  Sodium 135 - 145 mmol/L 139 140 140  Potassium 3.5 - 5.1 mmol/L 3.1(L) 3.3(L) 3.7  Chloride 101 - 111 mmol/L 105 108 109  CO2 22 - 32 mmol/L 27 22 25   Calcium 8.9 - 10.3 mg/dL 8.7(L) 8.5(L) 8.8(L)   NUCLEAR MEDICINE GASTROINTESTINAL BLEEDING SCAN 06/11/2016 FINDINGS: Sequential planar images of the abdomen and pelvis were obtained prepped 2 hours. No evidence of GI bleed is identified. Normal radiotracer activity in urinary bladder is noted. IMPRESSION: Negative for GI bleed.  Discharge Instructions: Discharge Instructions    Call MD for:  difficulty breathing, headache or visual disturbances    Complete by:  As directed    Call MD for:  extreme fatigue    Complete by:  As directed    Call MD for:  persistant dizziness or light-headedness    Complete by:  As directed    Diet - low sodium heart healthy    Complete by:  As directed    Discharge instructions    Complete by:  As directed    Please continue to take your medications as prescribed - except Eliquis - which we have decided to stop for a little while. You should discuss if/when to restart this medication at you hospital follow up visit.   We have prescribed 3 new medications: - Fibercon 625 mg daily - fiber supplement that will prevent constipation and help prevent more bleeding events. - Miralax daily - laxative / stool softener to prevent constipation, if this gives you diarrhea you can take it as needed instead of daily. - Potassium chloride tablet - 20 mEq daily - to supplement your low potassium  If you develop bleeding in your bowel movements again, or dark black bowel movements, chest pain, shortness of breath, dizziness, severe fatigue, sudden  weakness, difficulty walking, or impaired speech - please visit the nearest ER.  You can call our clinic to schedule a hospital follow up visit this coming Monday - when the office re-opens. We may call you to get this done as well.   Increase activity slowly    Complete by:  As directed       Signed: [name], MD 06/16/2016, 6:18 PM   Pager: [phone]

## 2016-06-13 NOTE — Progress Notes (Signed)
NURSING PROGRESS NOTE  Terese Vi [id number] Transfer Data: 06/13/2016 6:00 PM Attending Provider: Annia Belt, MD [id number], Arloa Koh, MD Code Status: FULL Allergies:  Patient has no known allergies. Past Medical History:   has a past medical history of Arthritis; Coronary artery disease; Diabetes mellitus without complication ([place]); Hypertension; and Pacemaker. Past Surgical History:   has a past surgical history that includes Colonoscopy (N/A, 01/06/2015); Esophagogastroduodenoscopy (N/A, 01/13/2015); and Total hip arthroplasty (Bilateral). Social History:   reports that she has never smoked. She has never used smokeless tobacco. She reports that she does not drink alcohol or use drugs.  Jill Short is a 82 y.o. female patient transferred from 3S>  Blood pressure 118/65, pulse 80, temperature 98.9 F (37.2 C), temperature source Oral, resp. rate 16, height 5\' 3"  (1.6 m), weight 58.5 kg (129 lb), SpO2 100 %.  Cardiac Monitoring: Box # 02 in place. Cardiac monitor yields:  IV Fluids:  IV in place, occlusive dsg intact without redness, IV cath forearm right, condition patent and no redness none.   Skin: Intact  Will continue to evaluate and treat per MD orders.  Belva Bertin

## 2016-06-14 LAB — CBC
HEMATOCRIT: 24.4 % — AB (ref 36.0–46.0)
HEMATOCRIT: 26.4 % — AB (ref 36.0–46.0)
HEMOGLOBIN: 8.2 g/dL — AB (ref 12.0–15.0)
HEMOGLOBIN: 8.8 g/dL — AB (ref 12.0–15.0)
MCH: 32.3 pg (ref 26.0–34.0)
MCH: 32.2 pg (ref 26.0–34.0)
MCHC: 33.6 g/dL (ref 30.0–36.0)
MCHC: 33.3 g/dL (ref 30.0–36.0)
MCV: 96.1 fL (ref 78.0–100.0)
MCV: 96.7 fL (ref 78.0–100.0)
Platelets: 380 10*3/uL (ref 150–400)
Platelets: 414 10*3/uL — ABNORMAL HIGH (ref 150–400)
RBC: 2.54 MIL/uL — AB (ref 3.87–5.11)
RBC: 2.73 MIL/uL — AB (ref 3.87–5.11)
RDW: 13.2 % (ref 11.5–15.5)
RDW: 13.1 % (ref 11.5–15.5)
WBC: 11.4 10*3/uL — AB (ref 4.0–10.5)
WBC: 12.8 10*3/uL — ABNORMAL HIGH (ref 4.0–10.5)

## 2016-06-14 LAB — BASIC METABOLIC PANEL WITH GFR
Anion gap: 7 (ref 5–15)
BUN: 11 mg/dL (ref 6–20)
CO2: 27 mmol/L (ref 22–32)
Calcium: 8.7 mg/dL — ABNORMAL LOW (ref 8.9–10.3)
Chloride: 105 mmol/L (ref 101–111)
Creatinine, Ser: 0.68 mg/dL (ref 0.44–1.00)
GFR calc Af Amer: >60 mL/min (ref >60)
GFR calc non Af Amer: >60 mL/min (ref >60)
Glucose, Bld: 88 mg/dL (ref 65–99)
Potassium: 3.1 mmol/L — ABNORMAL LOW (ref 3.5–5.1)
Sodium: 139 mmol/L (ref 135–145)

## 2016-06-14 MED ORDER — POLYETHYLENE GLYCOL 3350 17 G PO PACK: 17.0000 g | PACK | Freq: Every day | ORAL | 3 refills | Status: DC

## 2016-06-14 MED ORDER — POLYETHYLENE GLYCOL 3350 17 G PO PACK
17.0000 g | PACK | Freq: Every day | ORAL | Status: DC
  Administered 2016-06-14: 17 g via ORAL

## 2016-06-14 MED ORDER — CALCIUM POLYCARBOPHIL 625 MG PO TABS: 625.0000 mg | ORAL_TABLET | Freq: Every day | ORAL | 2 refills | Status: DC

## 2016-06-14 MED ORDER — CALCIUM CARBONATE-VITAMIN D 500-200 MG-UNIT PO TABS: 1.0000 | ORAL_TABLET | Freq: Every day | ORAL | Status: AC

## 2016-06-14 MED ORDER — POTASSIUM CHLORIDE ER 10 MEQ PO TBCR: 20.0000 meq | EXTENDED_RELEASE_TABLET | Freq: Every day | ORAL | 0 refills | Status: DC

## 2016-06-14 MED ORDER — CALCIUM POLYCARBOPHIL 625 MG PO TABS
625.0000 mg | ORAL_TABLET | Freq: Every day | ORAL | Status: DC
  Administered 2016-06-14: 625 mg via ORAL
  Filled 2016-06-14: qty 1

## 2016-06-14 MED ORDER — POLYETHYLENE GLYCOL 3350 17 G PO PACK: 17.0000 g | PACK | Freq: Every day | ORAL | Status: DC | PRN

## 2016-06-14 MED ORDER — POTASSIUM CHLORIDE CRYS ER 20 MEQ PO TBCR
40.0000 meq | EXTENDED_RELEASE_TABLET | Freq: Once | ORAL | Status: AC
  Administered 2016-06-14: 40 meq via ORAL
  Filled 2016-06-14: qty 2

## 2016-06-14 MED ORDER — CALCIUM CARBONATE-VITAMIN D 500-200 MG-UNIT PO TABS: 1.0000 | ORAL_TABLET | Freq: Every day | ORAL | Status: DC

## 2016-06-14 NOTE — Progress Notes (Signed)
  Date: 06/14/2016  Patient name: Jill Short  Medical record number: [id number]  Date of birth: [birth year]   I have personally seen and evaluated this patient and the plan of care was discussed with the house staff. Please see Dr. Durenda Age note for complete details. I concur with his findings.   H/H slowly dropping today.  There are not great options, either radiological or surgical for resolution of her diverticular bleeding.  Team will need to have a discussion with her regarding risk/benefit of starting back Eliquis prior to discharge.  She has a high CHADS-VASC score, but also with debilitating diverticular bleeding during this admission.  Continue to trend CBC as noted in Dr. Durenda Age note.   Sid Falcon, MD 06/14/2016, 11:25 AM

## 2016-06-14 NOTE — Care Management Note (Signed)
Case Management Note  Patient Details  Name: Jill Short MRN: [id number] Date of Birth: 04-May-1931  Subjective/Objective:                 Independent patient form home, lives with husband at daughter's home. Admitted with GIB, anticipated DC today. No CM needs identified at this time.    Action/Plan:  DC to home with daughter.   Expected Discharge Date:                  Expected Discharge Plan:  Home/Self Care  In-House Referral:     Discharge planning Services  CM Consult  Post Acute Care Choice:    Choice offered to:     DME Arranged:    DME Agency:     HH Arranged:    [place] Agency:     Status of Service:  Completed, signed off  If discussed at [employer] of Stay Meetings, dates discussed:    Additional Comments:  Carles Collet, RN 06/14/2016, 10:31 AM

## 2016-06-14 NOTE — Progress Notes (Addendum)
Subjective: Transferred to floor room yesterday. Stable with no complaints this morning, enjoying breakfast. Tried to have BM earlier this morning but has had none. Hb dropped to 8.2 from 9.3 but she has not noticed any new hematochezia.  Objective: Vital signs in last 24 hours: Vitals:   06/13/16 1300 06/13/16 1751 06/13/16 2109 06/14/16 0439  BP: 118/65 118/65 110/64 (!) 100/57  Pulse: 81 80 92 68  Resp: 19 16 18 18   Temp: 98.8 F (37.1 C) 98.9 F (37.2 C) 99.4 F (37.4 C) 98 F (36.7 C)  TempSrc: Oral Oral Oral Oral  SpO2: 98% 100% 97% 99%  Weight:      Height:        Intake/Output Summary (Last 24 hours) at 06/14/16 0841 Last data filed at 06/14/16 [id number]  Gross per 24 hour  Intake              150 ml  Output              375 ml  Net             -225 ml   Weight 129 lbs (prev 134 in 12/2014)   Physical Exam General: Alert, frail elderly woman resting in bed, in no distress HENT: PERLL, moist mucous membranes Cardiovascular: RRR, 2/6 systolic ejection murmur, otherwise no rubs or gallop Pulmonary: CTA BL. Normal work of breathing Abdomen: Soft, BS+, non-tender and non-distended. Extremities: Normal ROM, no edema, 1+ distal pulses.  Skin: Warm, dry, intact Psych: Positive affect  Labs / Imaging / Procedures: CBC Latest Ref Rng & Units 06/14/2016 06/13/2016 06/12/2016  WBC 4.0 - 10.5 K/uL 11.4(H) 14.4(H) 13.9(H)  Hemoglobin 12.0 - 15.0 g/dL 8.2(L) 9.3(L) 8.9(L)  Hematocrit 36.0 - 46.0 % 24.4(L) 29.4(L) 27.2(L)  Platelets 150 - 400 K/uL 380 427(H) 378   BMP Latest Ref Rng & Units 06/14/2016 06/12/2016 06/10/2016  Glucose 65 - 99 mg/dL 88 70 88  BUN 6 - 20 mg/dL 11 17 15   Creatinine 0.44 - 1.00 mg/dL 0.68 0.71 0.60  BUN/Creat Ratio 12 - 28 - - -  Sodium 135 - 145 mmol/L 139 140 140  Potassium 3.5 - 5.1 mmol/L 3.1(L) 3.3(L) 3.7  Chloride 101 - 111 mmol/L 105 108 109  CO2 22 - 32 mmol/L 27 22 25   Calcium 8.9 - 10.3 mg/dL 8.7(L) 8.5(L) 8.8(L)   No results  found.  Assessment/Plan: 82 y/o female on a/c with Eliquis with hx of diverticular bleed admitted for painless hematochezia.  Lower GI bleeding with acute blood loss anemia, likely from previously documented sigmoid diverticulosis  Hematochezia confirmed by rectal examination. Bleeding source likely from extensive diverticulosis (noted on recent colonoscopy 12/2014), or unlikely external hemorrhoids. Admitted 12/2014 with same complaint and was seen by Dr. Oletta Lamas of GI who performed an EGD/[place] which did not demonstrate overt bleeding.  Did however demonstrate severe diverticulosis. Was d/c home with instructions to take Miralax indefinitely and does not appear patient still taking. Last took Eliquis 11/19 evening. Hb stable at 12.1 and denies syx of symptomatic anemia. BRBPR returned on AM 11/21, developing hypotension to 100s/50s that has improved with IVF. NM scan on 11/21 PM showed no source of bleeding. Stable since - Hemoglobin dropped 8.2 from 9.3 this AM  - Recheck CBC at noon - Appreciate GI recs - Follow AM CBC - Added MiraLax and Fibercon daily  Atrial Fibrillation A/c with Eliquis 2.5 mg BID and rate control with Toprol XL 25mg  Qdaily. In atrial fibrillation during examination,  was rate controlled. CHADSVASC score 5. Will hold Eliquis in setting of GI bleed.  -Continue Toprol XL 25 mg daily  Hyperlipidemia -  LDL 60, HDL 44, TG 97.  -Continue Fenofibrate 160mg  daily  Aortic Stenosis Auscultated on examination. Presence of AS confirmed on recent echo 9/17: EF 50-55%, moderate AS, mild AI, Mild TR and MR.   Type 2 Diabetes Diet controlled. Most recent HbA1c 4.4% 12/2014.   CAD S/p Pacemaker - Stable, has seen her cardiologist this year  Hx of Iron Deficiency Anemia Takes iron supplementation at home. -Hold Iron supl  Diet: HH diet IVF: Received 1L NS in ED. NSL Code Status: FULL CODE DVT Prophylaxis: SCDs  Dispo: Anticipated discharge in approximately 1-2 day(s).    LOS: 5 days   [name], MD 06/14/2016, 8:41 AM Pager: [phone]

## 2016-06-14 NOTE — Progress Notes (Signed)
Pt discharged to home with belongings, ivs and tele removed. Pt given instructions, teach back performed. Pt stable at time of discharge.

## 2016-06-16 DIAGNOSIS — E876 Hypokalemia: Secondary | ICD-10-CM

## 2016-06-17 ENCOUNTER — Telehealth: Payer: Self-pay

## 2016-06-17 NOTE — Telephone Encounter (Signed)
Needs to speak with regarding potassium.

## 2016-06-18 ENCOUNTER — Other Ambulatory Visit: Payer: Self-pay | Admitting: *Deleted

## 2016-06-18 MED ORDER — POTASSIUM CHLORIDE ER 10 MEQ PO TBCR: 20.0000 meq | EXTENDED_RELEASE_TABLET | Freq: Every day | ORAL | 1 refills | Status: DC

## 2016-06-18 NOTE — Telephone Encounter (Signed)
Spoke w/ daughter, she states the pharm would not let pt fill K+, states the next refill would be 12/4 appr, pharm was called and pharm states that insurance paid a claim 12/4 but not at that [place], pt will need to call insurance co, called daughter back and she states no meds were picked up that day, that pt was not disch until appr 5pm that day, she was advised to speak once again w/ pharm and call insurance co. Pt is not taking K+ at this time. Also K+ was only written for #30 should have been #60 if she takes 2x daily, will send new script for approval

## 2016-06-24 ENCOUNTER — Encounter (INDEPENDENT_AMBULATORY_CARE_PROVIDER_SITE_OTHER): Payer: Self-pay

## 2016-06-24 ENCOUNTER — Ambulatory Visit (INDEPENDENT_AMBULATORY_CARE_PROVIDER_SITE_OTHER): Payer: Medicare Other | Admitting: Internal Medicine

## 2016-06-24 ENCOUNTER — Encounter: Payer: Self-pay | Admitting: Internal Medicine

## 2016-06-24 VITALS — BP 136/61 | HR 75 | Temp 98.0°F | Wt 126.6 lb

## 2016-06-24 DIAGNOSIS — Z6822 Body mass index (BMI) 22.0-22.9, adult: Secondary | ICD-10-CM

## 2016-06-24 DIAGNOSIS — I482 Chronic atrial fibrillation, unspecified: Secondary | ICD-10-CM

## 2016-06-24 DIAGNOSIS — E876 Hypokalemia: Secondary | ICD-10-CM

## 2016-06-24 DIAGNOSIS — Z79899 Other long term (current) drug therapy: Secondary | ICD-10-CM

## 2016-06-24 DIAGNOSIS — I4891 Unspecified atrial fibrillation: Secondary | ICD-10-CM

## 2016-06-24 DIAGNOSIS — K5731 Diverticulosis of large intestine without perforation or abscess with bleeding: Secondary | ICD-10-CM

## 2016-06-24 DIAGNOSIS — Z09 Encounter for follow-up examination after completed treatment for conditions other than malignant neoplasm: Secondary | ICD-10-CM

## 2016-06-24 DIAGNOSIS — Z8719 Personal history of other diseases of the digestive system: Secondary | ICD-10-CM | POA: Diagnosis not present

## 2016-06-24 MED ORDER — ASPIRIN EC 81 MG PO TBEC: 81.0000 mg | DELAYED_RELEASE_TABLET | Freq: Every day | ORAL | 2 refills | Status: DC

## 2016-06-24 NOTE — Assessment & Plan Note (Signed)
CHADSVASc score 5. Previously on Eliquis 2.5 mg BID but this was discontinued during recent hospitalization for diverticular bleed. Also on Metoprolol succinate 25 mg daily for rate control and is typically in NSR. We had a discussion with Jill Short and her daughter today about the risks and benefits of restarting Eliquis vs continuing with no anticoagulation vs Aspirin. Her primary concern is that she will have more bleeding, and wants to avoid this as much as possible. She also feels uncomfortable not being on any thromboprophylaxis for her A-Fib as this puts her at a 7% yearly risk for stroke based on her CHADSVASc score. She was content to try Aspirin 81 mg daily as a compromise - which should provide a small stroke risk reduction without significantly increasing her risk for GI bleed recurrence.  Plan: - Start Aspirin 81 mg daily - Continue Metoprolol Succinate (Toprol-XL) 25 mg daily

## 2016-06-24 NOTE — Assessment & Plan Note (Signed)
Discovered during previous hospital stay, K down to 3.1 requiring oral supplementation. She was discharged on potassium supplement of 20 mEq daily.  Check BMP today.

## 2016-06-24 NOTE — Patient Instructions (Addendum)
Please continue to take your medications as prescribed.  We have added Aspirin 81 mg daily to your medication list.  You can continue to take fiber supplements daily to keep your bowel movements soft - Miralax can be as needed for constipation.  We will call if any of your lab work results are concerning or abnormal.  Thank you for visiting [place] today.

## 2016-06-24 NOTE — Progress Notes (Signed)
   CC: GI bleeding, hospital follow up  HPI:  [name] is a 82 y.o. female with PMHx detailed below presenting for follow up visit for her recent hospitalization for lower GI bleeding.   See problem based assessment and plan below for additional details.  Past Medical History:  Diagnosis Date  . Arthritis   . Atrial fibrillation ([place])   . Coronary artery disease   . Diverticulosis   . Hypertension   . Lower GI bleed   . Pacemaker     Review of Systems: Review of Systems  Constitutional: Negative for chills, fever, malaise/fatigue and weight loss.  Respiratory: Negative for cough and shortness of breath.   Cardiovascular: Negative for chest pain and palpitations.  Gastrointestinal: Negative for abdominal pain, blood in stool, constipation, diarrhea, melena, nausea and vomiting.  Endo/Heme/Allergies:       Positive for cold intolerance     Physical Exam: Vitals:   06/24/16 1456  BP: 136/61  Pulse: 75  Temp: 98 F (36.7 C)  TempSrc: Oral  SpO2: 100%  Weight: 126 lb 9.6 oz (57.4 kg)   Body mass index is 22.43 kg/m. GENERAL- Elderly woman sitting comfortably in exam room chair, wrapped in heavy coat, alert, no distress, pale HEENT- Atraumatic, moist mucous membranes CARDIAC- Regular rate and rhythm, no murmurs, rubs or gallops. RESP- Clear to ascultation bilaterally, no wheezing or crackles, normal work of breathing ABDOMEN- Soft, nontender, nondistended EXTREMITIES- Normal bulk and range of motion, 1+ pitting edema to mid calves, 2+ peripheral pulses SKIN- Warm, dry, intact PSYCH- Appropriate affect, clear speech, thoughts linear and goal-directed  Assessment & Plan:   See encounters tab for problem based medical decision making.  Patient seen with Dr. Evette Doffing

## 2016-06-24 NOTE — Assessment & Plan Note (Addendum)
Recent hospitalization for 2 episodes of painless BRBPR, discharged on 11/24. Her Hb dropped from 12 to 8.6 but then stabilized after Eliquis was held, observed for several days inpatient and had a negative NM bleeding scan. She reports no recurrence of bloody stools since discharge, and has been taking fiber supplements and Miralax PRN and having BMs daily to every other day. She denies pica, fatigue, dyspnea, but continues to experience cold intolerance. She is concerned about her bleeding risk and is very uncomfortable with restarting Eliquis, but she feels uncomfortable being on no thromboprophylaxis for her A-fib either. She is interested trying a compromise of Aspirin daily, which will give her some risk reduction and also not put her at great risk for GI bleed.   Plan: - Check CBC today - Continue fiber, Miralax PRN, ferrous sulfate

## 2016-06-25 LAB — CBC
Hematocrit: 30.3 % — ABNORMAL LOW (ref 34.0–46.6)
Hemoglobin: 9.6 g/dL — ABNORMAL LOW (ref 11.1–15.9)
MCH: 30.8 pg (ref 26.6–33.0)
MCHC: 31.7 g/dL (ref 31.5–35.7)
MCV: 97 fL (ref 79–97)
PLATELETS: 562 10*3/uL — AB (ref 150–379)
RBC: 3.12 x10E6/uL — AB (ref 3.77–5.28)
RDW: 13.4 % (ref 12.3–15.4)
WBC: 10.6 10*3/uL (ref 3.4–10.8)

## 2016-06-25 LAB — BMP8+ANION GAP
ANION GAP: 13 mmol/L (ref 10.0–18.0)
BUN/Creatinine Ratio: 39 — ABNORMAL HIGH (ref 12–28)
BUN: 22 mg/dL (ref 8–27)
CO2: 25 mmol/L (ref 18–29)
CREATININE: 0.57 mg/dL (ref 0.57–1.00)
Calcium: 8.9 mg/dL (ref 8.7–10.3)
Chloride: 107 mmol/L — ABNORMAL HIGH (ref 96–106)
GFR calc Af Amer: 99 mL/min/{1.73_m2} (ref >59)
GFR calc non Af Amer: 86 mL/min/{1.73_m2} (ref >59)
Glucose: 84 mg/dL (ref 65–99)
Potassium: 4.7 mmol/L (ref 3.5–5.2)
SODIUM: 145 mmol/L — AB (ref 134–144)

## 2016-06-25 NOTE — Progress Notes (Signed)
Internal Medicine Clinic Attending  I saw and evaluated the patient.  I personally confirmed the key portions of the history and exam documented by Dr. Johnson and I reviewed pertinent patient test results.  The assessment, diagnosis, and plan were formulated together and I agree with the documentation in the resident's note.  

## 2016-07-22 DIAGNOSIS — K625 Hemorrhage of anus and rectum: Secondary | ICD-10-CM

## 2016-07-22 HISTORY — DX: Hemorrhage of anus and rectum: K62.5

## 2016-07-29 ENCOUNTER — Encounter (HOSPITAL_COMMUNITY): Payer: Self-pay

## 2016-07-29 ENCOUNTER — Inpatient Hospital Stay (HOSPITAL_COMMUNITY)
Admission: EM | Admit: 2016-07-29 | Discharge: 2016-08-01 | DRG: 378 | Disposition: A | Discharge status: Home / Self Care | Payer: Medicare Other | Attending: Oncology | Admitting: Oncology

## 2016-07-29 DIAGNOSIS — I480 Paroxysmal atrial fibrillation: Secondary | ICD-10-CM | POA: Diagnosis not present

## 2016-07-29 DIAGNOSIS — Q438 Other specified congenital malformations of intestine: Secondary | ICD-10-CM | POA: Diagnosis not present

## 2016-07-29 DIAGNOSIS — Z950 Presence of cardiac pacemaker: Secondary | ICD-10-CM | POA: Diagnosis present

## 2016-07-29 DIAGNOSIS — M1990 Unspecified osteoarthritis, unspecified site: Secondary | ICD-10-CM | POA: Diagnosis present

## 2016-07-29 DIAGNOSIS — Z79899 Other long term (current) drug therapy: Secondary | ICD-10-CM

## 2016-07-29 DIAGNOSIS — I10 Essential (primary) hypertension: Secondary | ICD-10-CM | POA: Diagnosis present

## 2016-07-29 DIAGNOSIS — E119 Type 2 diabetes mellitus without complications: Secondary | ICD-10-CM | POA: Diagnosis present

## 2016-07-29 DIAGNOSIS — K5733 Diverticulitis of large intestine without perforation or abscess with bleeding: Principal | ICD-10-CM | POA: Diagnosis present

## 2016-07-29 DIAGNOSIS — D62 Acute posthemorrhagic anemia: Secondary | ICD-10-CM | POA: Diagnosis not present

## 2016-07-29 DIAGNOSIS — Z7982 Long term (current) use of aspirin: Secondary | ICD-10-CM

## 2016-07-29 DIAGNOSIS — I083 Combined rheumatic disorders of mitral, aortic and tricuspid valves: Secondary | ICD-10-CM | POA: Diagnosis present

## 2016-07-29 DIAGNOSIS — K5909 Other constipation: Secondary | ICD-10-CM | POA: Diagnosis present

## 2016-07-29 DIAGNOSIS — K625 Hemorrhage of anus and rectum: Secondary | ICD-10-CM | POA: Diagnosis not present

## 2016-07-29 DIAGNOSIS — I2510 Atherosclerotic heart disease of native coronary artery without angina pectoris: Secondary | ICD-10-CM | POA: Diagnosis present

## 2016-07-29 DIAGNOSIS — E876 Hypokalemia: Secondary | ICD-10-CM | POA: Diagnosis present

## 2016-07-29 DIAGNOSIS — I350 Nonrheumatic aortic (valve) stenosis: Secondary | ICD-10-CM

## 2016-07-29 DIAGNOSIS — K922 Gastrointestinal hemorrhage, unspecified: Secondary | ICD-10-CM | POA: Diagnosis present

## 2016-07-29 DIAGNOSIS — Z96643 Presence of artificial hip joint, bilateral: Secondary | ICD-10-CM | POA: Diagnosis present

## 2016-07-29 DIAGNOSIS — I482 Chronic atrial fibrillation: Secondary | ICD-10-CM | POA: Diagnosis present

## 2016-07-29 HISTORY — DX: Hemorrhage of anus and rectum: K62.5

## 2016-07-29 LAB — COMPREHENSIVE METABOLIC PANEL
ALBUMIN: 3.2 g/dL — AB (ref 3.5–5.0)
ALT: 12 U/L — AB (ref 14–54)
AST: 24 U/L (ref 15–41)
Alkaline Phosphatase: 46 U/L (ref 38–126)
Anion gap: 9 (ref 5–15)
BILIRUBIN TOTAL: 0.7 mg/dL (ref 0.3–1.2)
BUN: 22 mg/dL — AB (ref 6–20)
CO2: 26 mmol/L (ref 22–32)
CREATININE: 0.67 mg/dL (ref 0.44–1.00)
Calcium: 9.1 mg/dL (ref 8.9–10.3)
Chloride: 106 mmol/L (ref 101–111)
GFR calc Af Amer: >60 mL/min (ref >60)
GFR calc non Af Amer: >60 mL/min (ref >60)
GLUCOSE: 112 mg/dL — AB (ref 65–99)
POTASSIUM: 3.5 mmol/L (ref 3.5–5.1)
Sodium: 141 mmol/L (ref 135–145)
TOTAL PROTEIN: 7.2 g/dL (ref 6.5–8.1)

## 2016-07-29 LAB — CBC
HCT: 32.1 % — ABNORMAL LOW (ref 36.0–46.0)
HEMATOCRIT: 36.2 % (ref 36.0–46.0)
Hemoglobin: 11.8 g/dL — ABNORMAL LOW (ref 12.0–15.0)
Hemoglobin: 10.6 g/dL — ABNORMAL LOW (ref 12.0–15.0)
MCH: 30.5 pg (ref 26.0–34.0)
MCH: 30.8 pg (ref 26.0–34.0)
MCHC: 32.6 g/dL (ref 30.0–36.0)
MCHC: 33 g/dL (ref 30.0–36.0)
MCV: 93.5 fL (ref 78.0–100.0)
MCV: 93.3 fL (ref 78.0–100.0)
PLATELETS: 354 10*3/uL (ref 150–400)
PLATELETS: 319 10*3/uL (ref 150–400)
RBC: 3.87 MIL/uL (ref 3.87–5.11)
RBC: 3.44 MIL/uL — ABNORMAL LOW (ref 3.87–5.11)
RDW: 14.2 % (ref 11.5–15.5)
RDW: 14.3 % (ref 11.5–15.5)
WBC: 12.9 10*3/uL — ABNORMAL HIGH (ref 4.0–10.5)
WBC: 11.6 10*3/uL — ABNORMAL HIGH (ref 4.0–10.5)

## 2016-07-29 LAB — TYPE AND SCREEN
ABO/RH(D): A NEG
Antibody Screen: NEGATIVE

## 2016-07-29 LAB — POC OCCULT BLOOD, ED: Fecal Occult Bld: POSITIVE — AB

## 2016-07-29 LAB — GLUCOSE, CAPILLARY
GLUCOSE-CAPILLARY: 92 mg/dL (ref 65–99)
Glucose-Capillary: 81 mg/dL (ref 65–99)
Glucose-Capillary: 66 mg/dL (ref 65–99)

## 2016-07-29 MED ORDER — METOPROLOL SUCCINATE ER 25 MG PO TB24
25.0000 mg | ORAL_TABLET | Freq: Every day | ORAL | Status: DC
  Administered 2016-07-30 – 2016-08-01 (×3): 25 mg via ORAL
  Filled 2016-07-29 (×4): qty 1

## 2016-07-29 MED ORDER — SODIUM CHLORIDE 0.9 % IV SOLN
INTRAVENOUS | Status: AC
Start: 2016-07-29 — End: 2016-07-30
  Administered 2016-07-29: 14:00:00 via INTRAVENOUS

## 2016-07-29 MED ORDER — SODIUM CHLORIDE 0.9 % IV SOLN: Freq: Once | INTRAVENOUS | Status: DC

## 2016-07-29 MED ORDER — SODIUM CHLORIDE 0.9 % IV SOLN
INTRAVENOUS | Status: DC
  Administered 2016-07-29: 12:00:00 via INTRAVENOUS

## 2016-07-29 NOTE — ED Notes (Signed)
Pt reports rectal bleeding starting this morning at 5 am with hx of the same x 3 episodes.

## 2016-07-29 NOTE — H&P (Signed)
Date: 07/29/2016               Patient Name:  Jill Short MRN: [id number]  DOB: [birth year] Age / Sex: 83 y.o., female   PCP: Florinda Marker, MD         Medical Service: Internal Medicine Teaching Service         Attending Physician: Dr. Annia Belt, MD    First Contact: Dr. Ledell Noss Pager: [phone]  Second Contact: Dr. Jule Ser Pager: [phone]       After Hours (After 5p/  First Contact Pager: [phone]  weekends / holidays): Second Contact Pager: [phone]   Chief Complaint: Bloody bowel movement  History of Present Illness: Jill Short is an 83 year old native of [place] with diverticulosis complicated by recurrent lower GI bleeding, chronic atrial fibrillation not on anticoagulation, symptomatic bradycardia s/p pacemaker, moderate aortic stenosis presenting to the emergency department with Jill Short for a bloody bowel movement. Jill Short contributed to the history as he was at bedside.  This morning, she woke around 5:00 this morning and felt bloated and "a wiggling in my stomach." She went to use restroom at which point there was "blood all over the toilet" followed by a painless, bloody bowel movement. Jill Short immediately called Jill Short who is a Airline pilot who helped escort them both to the hospital for further evaluation. She has had similar hospitalizations in July 2016 at which time she required multiple blood transfusions. Colonoscopy was notable for severe diverticulosis, and Jill upper endoscopy was without an identifiable source of bleeding. She had a similar episode in November 2016 at which tagged RBC scan was without identifiable source of bleed. Hemoglobin on discharge was 8.8. Jill apixaban was discontinued on this hospitalization, she was started on aspirin monotherapy at Jill follow-up visit on 06/24/16 following an informed discussion of the risk of bleeding versus the risk of CVA with Jill atrial fibrillation. Since Jill last hospitalization, she  denies any other NSAID use, abdominal pain, nausea, vomiting, rectal bleeding, bloody bowel movements, dizziness, weakness, syncope, falls, chest pain, shortness of breath. Though she denies a prior history of MI or CVA, she did have a history of diabetes though is now diet-controlled .  In the emergency department, Jill initial hemoglobin was found to be 11.8 with MCV 93.5. FOBT was positive, and GI was consulted.  Meds:  Current Meds  Medication Sig  . aspirin EC 81 MG tablet Take 1 tablet (81 mg total) by mouth daily.  . calcium-vitamin D (OSCAL WITH D) 500-200 MG-UNIT tablet Take 1 tablet by mouth daily with breakfast.  . cholecalciferol (VITAMIN D) 1000 UNITS tablet Take 1,000 Units by mouth daily.  . Cyanocobalamin (B-12 PO) Take 1 tablet by mouth daily.  . fenofibrate micronized (LOFIBRA) 200 MG capsule Take 200 mg by mouth daily before breakfast.  . ferrous sulfate 325 (65 FE) MG tablet TAKE ONE TABLET BY MOUTH DAILY WITH BREAKFAST  . Flaxseed, Linseed, (FLAX PO) Take 1,200 mg by mouth daily.  . metoprolol succinate (TOPROL-XL) 25 MG 24 hr tablet Take 25 mg by mouth daily.  . pantoprazole (PROTONIX) 40 MG tablet TAKE ONE TABLET BY MOUTH DAILY  . polycarbophil (FIBERCON) 625 MG tablet Take 1 tablet (625 mg total) by mouth daily.  . polyethylene glycol (MIRALAX / GLYCOLAX) packet Take 17 g by mouth daily. (Patient taking differently: Take 17 g by mouth daily as needed for mild constipation. )  . potassium chloride (K-DUR) 10 MEQ  tablet Take 2 tablets (20 mEq total) by mouth daily.  . vitamin C (ASCORBIC ACID) 500 MG tablet Take 500 mg by mouth daily.     Allergies: Allergies as of 07/29/2016  . (No Known Allergies)   Past Medical History:  Diagnosis Date  . Arthritis   . Atrial fibrillation ([place])   . Coronary artery disease   . Diverticulosis   . Hypertension   . Lower GI bleed   . Pacemaker     Family History: No history of lung and heart disease.   Social History:  Married for 63 years to Jill Short and has 6 children. Enjoys walking as the weather allows. Travels to [place] during the winter though not this year due to the weather.  Review of Systems: A complete ROS was negative except as per HPI.   Physical Exam: Blood pressure 117/66, pulse 86, temperature 98.7 F (37.1 C), temperature source Oral, resp. rate 17, height 5\' 6"  (1.676 m), weight 130 lb (59 kg), SpO2 97 %. Physical Exam  Constitutional: She is oriented to person, place, and time. No distress.  HENT:  Head: Normocephalic and atraumatic.  Eyes: Conjunctivae are normal. No scleral icterus.  Cardiovascular: Normal rate and intact distal pulses.   Systolic ejection murmur at right upper sternal border.  Pulmonary/Chest: Effort normal. No respiratory distress.  Abdominal: Soft. Bowel sounds are normal. She exhibits no distension. There is no tenderness. There is no rebound and no guarding.  Neurological: She is alert and oriented to person, place, and time.  Skin: Skin is warm and dry. She is not diaphoretic.   Assessment & Plan by Problem: Principal Problem:   GI bleed Active Problems:   Cardiac pacemaker in situ   Atrial fibrillation (HCC)   Diverticulosis of colon   Aortic stenosis  Jill Short is an 83 year old native of [place] with diverticulosis complicated by recurrent lower GI bleeding, chronic atrial fibrillation not on anticoagulation, symptomatic bradycardia s/p pacemaker, moderate aortic stenosis hospitalized for diverticular bleeding.  Diverticular bleeding: Similar presentation to Jill past hospitalizations. ASA likely contributory. No additional bleeding since admission which is reassuring.   -Continue NS @ 75cc/hr -Switch to clear liquid diet -Transfuse if Hb<8 given cardiac co-morbidities -Follow-up GI recommendations  Chronic atrial fibrillation not on anticoagulation: CHADS Vasc score of 5 [age, gender, hypertension, history of diabetes] which correlates with a  7.2% annual risk of stroke. -Monitor on telemetry -Continue metoprolol succinate 25 mg daily -Holding ASA   Non-insulin-dependent Type 2 diabetes: Check CBGs at meals and bedtime. No insulin given that she does not have a regular diet.  Moderate aortic stenosis: Echo 04/03/16 with EF 50-55%, moderate aortic stenosis, mild mitral regurgitation, mild tricuspid regurgitation, mild aortic regurgitation, mild left atrial dilatation.  -Continue monitoring  Dispo: Admit patient to Observation with expected length of stay less than 2 midnights.  Signed: Riccardo Dubin, MD 07/29/2016, 11:57 AM  Pager: [phone]

## 2016-07-29 NOTE — ED Triage Notes (Signed)
Pt states she started having pain in her side and woke up to rectal bleeding; pt has hx of rectal bleeds; pt a&o 4 on arrival. Pt denies pain on arrival.

## 2016-07-29 NOTE — Progress Notes (Signed)
Patient arrived to the unit, vitals stable, oriented to room and unit, no pain. CCMD notified

## 2016-07-29 NOTE — ED Provider Notes (Signed)
[facility] DEPT Provider Note   CSN: [id number] Arrival date & time: 07/29/16  [id number]     History   Chief Complaint Chief Complaint  Patient presents with  . Rectal Bleeding    HPI Jill Short is a 83 y.o. female.  HPI Patient presents with concern of rectal bleeding. Twice over the past year patient has had profuse amount of bleeding, with minimal stool. No associated abdominal pain that is persistent, though there is occasional left lower quadrant abdominal pain. There is associated weakness, pallor, no syncope, no chest pain. She is here with her family members. Patient has a history of diverticulosis, prior bleeds. She was previously on request, but is currently taking only aspirin.    Past Medical History:  Diagnosis Date  . Arthritis   . Atrial fibrillation ([place])   . Coronary artery disease   . Diverticulosis   . Hypertension   . Lower GI bleed   . Pacemaker     Patient Active Problem List   Diagnosis Date Noted  . Hypokalemia 06/16/2016  . Chronic anticoagulation   . Aortic stenosis 06/09/2016  . Coronary artery disease 06/09/2016  . Acute blood loss anemia 05/17/2016  . Hyperlipemia 05/17/2016  . Cardiac pacemaker in situ 01/02/2015  . DM (diabetes mellitus) ([place]) 01/02/2015  . Atrial fibrillation ([place]) 01/02/2015  . Anticoagulated by anticoagulation treatment 01/02/2015  . Diverticulosis of colon with hemorrhage 01/02/2015  . GI bleed 01/01/2015    Past Surgical History:  Procedure Laterality Date  . COLONOSCOPY N/A 01/06/2015   Procedure: COLONOSCOPY;  Surgeon: Laurence Spates, MD;  Location: [facility] ENDOSCOPY;  Service: Endoscopy;  Laterality: N/A;  . ESOPHAGOGASTRODUODENOSCOPY N/A 01/13/2015   Procedure: ESOPHAGOGASTRODUODENOSCOPY (EGD);  Surgeon: Laurence Spates, MD;  Location[facility] ENDOSCOPY;  Service: Endoscopy;  Laterality: N/A;  . TOTAL HIP ARTHROPLASTY Bilateral     OB History    No data available       Home Medications    Prior to  Admission medications   Medication Sig Start Date End Date Taking? Authorizing Provider  aspirin EC 81 MG tablet Take 1 tablet (81 mg total) by mouth daily. 06/24/16 06/24/17  [name], MD  calcium-vitamin D (OSCAL WITH D) 500-200 MG-UNIT tablet Take 1 tablet by mouth daily with breakfast. 06/14/16   [name], MD  cholecalciferol (VITAMIN D) 1000 UNITS tablet Take 1,000 Units by mouth daily.    Historical Provider, MD  Cyanocobalamin (B-12 PO) Take 1 tablet by mouth daily.    Historical Provider, MD  fenofibrate micronized (LOFIBRA) 200 MG capsule Take 200 mg by mouth daily before breakfast.    Historical Provider, MD  ferrous sulfate 325 (65 FE) MG tablet TAKE ONE TABLET BY MOUTH DAILY WITH BREAKFAST 03/08/15   Alexa Angela Burke, MD  Flaxseed, Linseed, (FLAX PO) Take 1,200 mg by mouth daily.    Historical Provider, MD  metoprolol succinate (TOPROL-XL) 25 MG 24 hr tablet Take 25 mg by mouth daily.    Historical Provider, MD  pantoprazole (PROTONIX) 40 MG tablet TAKE ONE TABLET BY MOUTH DAILY 02/16/15   Alexa Angela Burke, MD  polycarbophil (FIBERCON) 625 MG tablet Take 1 tablet (625 mg total) by mouth daily. 06/15/16   [name], MD  polyethylene glycol [facility] / Floria Raveling) packet Take 17 g by mouth daily. 06/15/16   [name], MD  potassium chloride (K-DUR) 10 MEQ tablet Take 2 tablets (20 mEq total) by mouth daily. 06/18/16   Florinda Marker, MD  vitamin C (ASCORBIC ACID) 500  MG tablet Take 500 mg by mouth daily.    Historical Provider, MD    Family History No family history on file.  Social History Social History  Substance Use Topics  . Smoking status: Never Smoker  . Smokeless tobacco: Never Used  . Alcohol use No     Allergies   Patient has no known allergies.   Review of Systems Review of Systems  Constitutional:       Per HPI, otherwise negative  HENT:       Per HPI, otherwise negative  Respiratory:       Per HPI, otherwise negative  Cardiovascular:       Per HPI,  otherwise negative  Gastrointestinal: Positive for abdominal pain, blood in stool and nausea. Negative for vomiting.  Endocrine:       Negative aside from HPI  Genitourinary:       Neg aside from HPI   Musculoskeletal:       Per HPI, otherwise negative  Skin: Positive for pallor.  Neurological: Positive for weakness. Negative for syncope.     Physical Exam Updated Vital Signs BP (!) 171/101 (BP Location: Left Arm)   Pulse 86   Temp 97.9 F (36.6 C) (Oral)   Resp 18   Ht 5\' 6"  (1.676 m)   Wt 130 lb (59 kg)   SpO2 100%   BMI 20.98 kg/m   Physical Exam  Constitutional: She is oriented to person, place, and time. She has a sickly appearance.  HENT:  Head: Normocephalic and atraumatic.  Eyes: Conjunctivae and EOM are normal.  Cardiovascular: Normal rate and regular rhythm.   Pulmonary/Chest: Effort normal and breath sounds normal. No stridor. No respiratory distress.  Abdominal: She exhibits no distension.  No appreciable pain w palpation.   Musculoskeletal: She exhibits no edema.  Neurological: She is alert and oriented to person, place, and time. No cranial nerve deficit.  Skin: Skin is warm and dry. There is pallor.  Psychiatric: She has a normal mood and affect.  Nursing note and vitals reviewed.    ED Treatments / Results  Labs (all labs ordered are listed, but only abnormal results are displayed) Labs Reviewed  COMPREHENSIVE METABOLIC PANEL - Abnormal; Notable for the following:       Result Value   Glucose, Bld 112 (*)    BUN 22 (*)    Albumin 3.2 (*)    ALT 12 (*)    All other components within normal limits  CBC - Abnormal; Notable for the following:    WBC 12.9 (*)    Hemoglobin 11.8 (*)    All other components within normal limits  POC OCCULT BLOOD, ED - Abnormal; Notable for the following:    Fecal Occult Bld POSITIVE (*)    All other components within normal limits  POC OCCULT BLOOD, ED  TYPE AND SCREEN     Procedures Procedures (including  critical care time)  Medications Ordered in ED Normal saline provided  Initial Impression / Assessment and Plan / ED Course  I have reviewed the triage vital signs and the nursing notes.  Pertinent labs & imaging results that were available during my care of the patient were reviewed by me and considered in my medical decision making (see chart for details).  Clinical Course     Initial labs notable for reassuring hemoglobin, but patient continues to have active bleeding. Consultation the patient's GI service, and her admitting team. On repeat exam the patient has stable blood pressure,  remains in similar condition.  Final Clinical Impressions(s) / ED Diagnoses  Elderly female diverticulosis, history of recurrent GI bleed, currently on aspirin therapy presents with ongoing bleeding. Patient's blood pressure, hemoglobin reassuring, but with active bleeding, and a patient with known diverticulosis, on antiplatelet therapy, the patient was admitted for further evaluation, management.    Carmin Muskrat, MD 07/29/16 1056

## 2016-07-29 NOTE — Progress Notes (Signed)
Patients blood pressure is 103/46 hr 84. Paged provider to see if they would like to hold metoprolol. Awaiting orders

## 2016-07-30 DIAGNOSIS — Q438 Other specified congenital malformations of intestine: Secondary | ICD-10-CM | POA: Diagnosis not present

## 2016-07-30 DIAGNOSIS — K5730 Diverticulosis of large intestine without perforation or abscess without bleeding: Secondary | ICD-10-CM | POA: Diagnosis not present

## 2016-07-30 DIAGNOSIS — I083 Combined rheumatic disorders of mitral, aortic and tricuspid valves: Secondary | ICD-10-CM | POA: Diagnosis not present

## 2016-07-30 DIAGNOSIS — K5904 Chronic idiopathic constipation: Secondary | ICD-10-CM | POA: Diagnosis not present

## 2016-07-30 DIAGNOSIS — K5909 Other constipation: Secondary | ICD-10-CM | POA: Diagnosis present

## 2016-07-30 DIAGNOSIS — E876 Hypokalemia: Secondary | ICD-10-CM | POA: Diagnosis present

## 2016-07-30 DIAGNOSIS — I480 Paroxysmal atrial fibrillation: Secondary | ICD-10-CM | POA: Diagnosis not present

## 2016-07-30 DIAGNOSIS — K5733 Diverticulitis of large intestine without perforation or abscess with bleeding: Secondary | ICD-10-CM | POA: Diagnosis present

## 2016-07-30 DIAGNOSIS — Z79899 Other long term (current) drug therapy: Secondary | ICD-10-CM | POA: Diagnosis not present

## 2016-07-30 DIAGNOSIS — E119 Type 2 diabetes mellitus without complications: Secondary | ICD-10-CM | POA: Diagnosis not present

## 2016-07-30 DIAGNOSIS — I350 Nonrheumatic aortic (valve) stenosis: Secondary | ICD-10-CM

## 2016-07-30 DIAGNOSIS — Z7982 Long term (current) use of aspirin: Secondary | ICD-10-CM | POA: Diagnosis not present

## 2016-07-30 DIAGNOSIS — D500 Iron deficiency anemia secondary to blood loss (chronic): Secondary | ICD-10-CM | POA: Diagnosis not present

## 2016-07-30 DIAGNOSIS — K625 Hemorrhage of anus and rectum: Secondary | ICD-10-CM | POA: Diagnosis not present

## 2016-07-30 DIAGNOSIS — Z96643 Presence of artificial hip joint, bilateral: Secondary | ICD-10-CM | POA: Diagnosis present

## 2016-07-30 DIAGNOSIS — K5731 Diverticulosis of large intestine without perforation or abscess with bleeding: Secondary | ICD-10-CM

## 2016-07-30 DIAGNOSIS — Z950 Presence of cardiac pacemaker: Secondary | ICD-10-CM | POA: Diagnosis not present

## 2016-07-30 DIAGNOSIS — I2510 Atherosclerotic heart disease of native coronary artery without angina pectoris: Secondary | ICD-10-CM | POA: Diagnosis not present

## 2016-07-30 DIAGNOSIS — I10 Essential (primary) hypertension: Secondary | ICD-10-CM | POA: Diagnosis not present

## 2016-07-30 DIAGNOSIS — I482 Chronic atrial fibrillation: Secondary | ICD-10-CM | POA: Diagnosis not present

## 2016-07-30 DIAGNOSIS — D62 Acute posthemorrhagic anemia: Secondary | ICD-10-CM | POA: Diagnosis present

## 2016-07-30 DIAGNOSIS — M1990 Unspecified osteoarthritis, unspecified site: Secondary | ICD-10-CM | POA: Diagnosis present

## 2016-07-30 LAB — GLUCOSE, CAPILLARY
GLUCOSE-CAPILLARY: 85 mg/dL (ref 65–99)
GLUCOSE-CAPILLARY: 75 mg/dL (ref 65–99)
GLUCOSE-CAPILLARY: 73 mg/dL (ref 65–99)
Glucose-Capillary: 87 mg/dL (ref 65–99)

## 2016-07-30 LAB — CBC
HEMATOCRIT: 28.6 % — AB (ref 36.0–46.0)
HEMOGLOBIN: 9.4 g/dL — AB (ref 12.0–15.0)
MCH: 30.7 pg (ref 26.0–34.0)
MCHC: 32.9 g/dL (ref 30.0–36.0)
MCV: 93.5 fL (ref 78.0–100.0)
Platelets: 269 10*3/uL (ref 150–400)
RBC: 3.06 MIL/uL — ABNORMAL LOW (ref 3.87–5.11)
RDW: 14.6 % (ref 11.5–15.5)
WBC: 7.7 10*3/uL (ref 4.0–10.5)

## 2016-07-30 LAB — BASIC METABOLIC PANEL
ANION GAP: 6 (ref 5–15)
BUN: 14 mg/dL (ref 6–20)
CALCIUM: 8.7 mg/dL — AB (ref 8.9–10.3)
CHLORIDE: 108 mmol/L (ref 101–111)
CO2: 27 mmol/L (ref 22–32)
Creatinine, Ser: 0.58 mg/dL (ref 0.44–1.00)
GFR calc Af Amer: >60 mL/min (ref >60)
GFR calc non Af Amer: >60 mL/min (ref >60)
GLUCOSE: 83 mg/dL (ref 65–99)
Potassium: 3 mmol/L — ABNORMAL LOW (ref 3.5–5.1)
Sodium: 141 mmol/L (ref 135–145)

## 2016-07-30 MED ORDER — SODIUM CHLORIDE 0.9 % IV SOLN
INTRAVENOUS | Status: AC
  Administered 2016-07-30: 17:00:00 via INTRAVENOUS

## 2016-07-30 MED ORDER — POLYETHYLENE GLYCOL 3350 17 G PO PACK
17.0000 g | PACK | Freq: Three times a day (TID) | ORAL | Status: DC
  Administered 2016-07-30 – 2016-07-31 (×3): 17 g via ORAL
  Filled 2016-07-30 (×3): qty 1

## 2016-07-30 NOTE — Progress Notes (Addendum)
  Subjective: [name] says she is not feeling lightheadedness, chest pain, or shortness of breath today. She has had no further bloody bowel movements since the day of admission.   Objective:  Vital signs in last 24 hours: Vitals:   07/29/16 2152 07/30/16 0610 07/30/16 0825 07/30/16 1345  BP: 139/66 (!) 123/59 (!) 132/91 135/73  Pulse: 90 77 81 80  Resp: 18 17  20   Temp: 98.3 F (36.8 C) 97.5 F (36.4 C)  98 F (36.7 C)  TempSrc:  Oral  Oral  SpO2: 96% 97% 100% 100%  Weight:      Height:       Physical Exam  Constitutional: She appears well-developed and well-nourished. No distress.  Eyes: Conjunctivae are normal. No scleral icterus.  Cardiovascular: Normal rate and regular rhythm.   Murmur heard. Systolic murmur   Pulmonary/Chest: Effort normal. No respiratory distress. She has no wheezes. She has no rales.  Abdominal: Soft. Bowel sounds are normal. She exhibits distension. There is no tenderness. There is no guarding.  Skin: She is not diaphoretic.     Assessment/Plan: Pt is a 83 y.o. yo female with a PMHx of diverticulosis, chronic atrial fibrillation, non insulin dependent type 2 DM,  who was admitted on 07/29/2016 with symptoms of painless bright red blood per rectum, which was determined to be secondary to lower GI bleed.   Principal Problem:   GI bleed Active Problems:   Cardiac pacemaker in situ   Atrial fibrillation (HCC)   Diverticulitis of colon with bleeding   Aortic stenosis  GI bleed  Diverticulitis  Hemoglobin trending down since initial presentation but she reports no further bleeding and remains asymptomatic. Hgb today 9.4 with b/l 8. I spoke with the overnight on call physician at [facility] this evening, he says that she will be added to their consult list for tomorrow and asked for her to be started on Miralax TID and to continue clear liquid diet. I am hopeful that she can be taken for colonoscopy tomorrow, this would be helpful in guiding  further management .  -will follow up GI recommendations  -follow up EKG  Chronic Atrial fibrillation Rate controlled in the 80s. CHADSVASC = 5 (age, gender, HTN, DM) - holding aspirin  -Continue metoprolol succinate 25 mg qd  -continue telemetry   Non insulin dependent type 2 DM  CBG 70s on clear liquid diet  - d/c CBG monitoring   Dispo: Anticipated discharge in approximately 1-2 day(s).   Ledell Noss, MD 07/30/2016, 8:34 PM Pager: [phone]

## 2016-07-31 LAB — GLUCOSE, CAPILLARY: GLUCOSE-CAPILLARY: 71 mg/dL (ref 65–99)

## 2016-07-31 MED ORDER — POTASSIUM CHLORIDE CRYS ER 20 MEQ PO TBCR: 60.0000 meq | EXTENDED_RELEASE_TABLET | Freq: Two times a day (BID) | ORAL | Status: DC

## 2016-07-31 MED ORDER — ASPIRIN EC 81 MG PO TBEC
81.0000 mg | DELAYED_RELEASE_TABLET | Freq: Every day | ORAL | Status: DC
  Administered 2016-07-31 – 2016-08-01 (×2): 81 mg via ORAL
  Filled 2016-07-31 (×2): qty 1

## 2016-07-31 MED ORDER — POLYETHYLENE GLYCOL 3350 17 G PO PACK
17.0000 g | PACK | Freq: Every day | ORAL | Status: DC
  Administered 2016-08-01: 17 g via ORAL
  Filled 2016-07-31: qty 1

## 2016-07-31 MED ORDER — POTASSIUM CHLORIDE CRYS ER 20 MEQ PO TBCR
60.0000 meq | EXTENDED_RELEASE_TABLET | Freq: Once | ORAL | Status: AC
  Administered 2016-07-31: 60 meq via ORAL
  Filled 2016-07-31: qty 3

## 2016-07-31 MED ORDER — POTASSIUM CHLORIDE CRYS ER 20 MEQ PO TBCR: 40.0000 meq | EXTENDED_RELEASE_TABLET | Freq: Two times a day (BID) | ORAL | Status: DC

## 2016-07-31 NOTE — Progress Notes (Addendum)
  Subjective: [name] denies lightheadedness, chest pain, or shortness of breath today. She has had no further bloody bowel movements since the day of admission.   Objective:  Vital signs in last 24 hours: Vitals:   07/30/16 0825 07/30/16 1345 07/31/16 0452 07/31/16 1106  BP: (!) 132/91 135/73 111/79 (!) 137/93  Pulse: 81 80 67 (!) 102  Resp:  20 17   Temp:  98 F (36.7 C) 97.5 F (36.4 C)   TempSrc:  Oral Oral   SpO2: 100% 100% 98%   Weight:      Height:       Physical Exam  Constitutional: She appears well-developed and well-nourished. No distress.  Eyes: Conjunctivae are normal. No scleral icterus.  Cardiovascular: Normal rate and regular rhythm.   Murmur heard. Systolic murmur   Pulmonary/Chest: Effort normal. No respiratory distress. She has no wheezes. She has no rales.  Abdominal: Soft. Bowel sounds are normal. She exhibits distension. There is no tenderness. There is no guarding.  Skin: She is not diaphoretic.     Assessment/Plan: Pt is a 83 y.o. yo female with a PMHx of diverticulosis, chronic atrial fibrillation, non insulin dependent type 2 DM,  who was admitted on 07/29/2016 with symptoms of painless bright red blood per rectum, which was determined to be secondary to lower GI bleed.   Principal Problem:   GI bleed Active Problems:   Cardiac pacemaker in situ   Atrial fibrillation (HCC)   Diverticulitis of colon with bleeding   Aortic stenosis  GI bleed  Acute bleeding diverticulosis Bleeding has resolved and she remains asymptomatic. She is awaiting GI consult. I spoke with the overnight on call physician at [facility] yesterday evening, he said that she would be added to their consult list for today and asked for her to be started on Miralax TID and to continue clear liquid diet. Spoke with the on call physician at [facility] today and found out that she is considered an unassigned patient as she only had procedures performed by them on prior  hospitalization and has not been seen in their office and that she has been placed on the unassigned consult list by them. I am hopeful that she can be taken for colonoscopy today, this would be helpful in guiding further management. EKG showed no changes suggestive of acute ischemia.  -will follow up GI recommendations   Hypokalemia  K 3.0 on last check. No concerning EKG changes on this mornings BMP.  -repleated  -follow up morning BMP   Chronic Atrial fibrillation Rate controlled in the 80s. CHADSVASC = 5 (age, gender, HTN, DM.)  - Restart enteric coated aspirin  -Continue metoprolol succinate 25 mg qd  -continue telemetry   Non insulin dependent type 2 DM  CBG 70s on clear liquid diet   Dispo: Anticipated discharge in approximately 1-2 day(s).   Jill Noss, MD 07/31/2016, 2:10 PM Pager: [phone]

## 2016-07-31 NOTE — Progress Notes (Signed)
Medicine attending: I examined this patient today together with resident physician Dr. Ledell Noss and I concur with her evaluation and management plan which we discussed together. No new lab today. She remains hemodynamically stable and in an atrial paced rhythm Abdomen remained distended, tympanitic, but nontender. She had a bowel movement this morning and did not see any blood. Gastroenterology consultation has been called. I strongly feel that this lady should have a colonoscopy. This is her third diverticular bleed in the space of a year. Last colonoscopy approximately 2 years ago. She may be amenable to any number of local procedures if a focal point of bleeding can be found.

## 2016-07-31 NOTE — Care Management Note (Signed)
Case Management Note  Patient Details  Name: Tametra Koury MRN: [id number] Date of Birth: Apr 17, 1931  Subjective/Objective:    Admitted with GI Bleed, hx of diverticulosis/ lower GI bleeding,  atrial fibrillation , symptomatic bradycardia, s/p pacemaker, moderate aortic stenosis. Pt states she and husband is from [place] and has been in [place] living with daughter Tye [place] since Oct.2017. PTA pt states independent with ADL's and no DME usage.   PCP; Dr. Beryle Beams ([facility])  Action/Plan: GI consult pending.... CM to f/u with disposition needs.   Expected Discharge Date:                  Expected Discharge Plan:  Home/Self Care  In-House Referral:     Discharge planning Services  CM Consult  Post Acute Care Choice:    Choice offered to:     DME Arranged:    DME Agency:     HH Arranged:    HH Agency:     Status of Service:  In process, will continue to follow  If discussed at Long Length of Stay Meetings, dates discussed:    Additional Comments:  Sharin Mons, RN 07/31/2016, 1:27 PM

## 2016-07-31 NOTE — Consult Note (Signed)
Unassigned patient Reason for Consult: Rectal bleeding 2 day ago. Referring Physician: Kaiya Short is an 83 y.o. female.  HPI: Jill Short is 66 old white female with multiple medical problems listed below who presented to the hospital with painless rectal bleeding. She had a similar hospitalization 2016 when the EGD and colonoscopy were done. This was done by Dr. Laurence Spates and she was found to have pandiverticulosis with a very redundant colon. The EGD was unremarkable. In November 2017, which was her most recent admission, the surgeons were reluctant to do a total colectomy at the time and the patient was discharged home with plans for "close observation. She has come been on Eliquis until this hospitalization and now has been switched to aspirin she has had no further rectal bleeding since the problems on admission she denies any nausea vomiting or abdominal pain. She has a history of chronic constipation for which she takes Miralax on a daily basis. She is anxious to go home. She owns a motor home and was on her way to [place] when she got sick. She is currently living with her daughter tell she is able to get back on her feet. .  Past Medical History:  Diagnosis Date  . Arthritis   . Atrial fibrillation ([place])   . Coronary artery disease   . Diverticulosis   . Hypertension   . Lower GI bleed   . Pacemaker   . Rectal bleed 07/2016   Past Surgical History:  Procedure Laterality Date  . COLONOSCOPY N/A 01/06/2015   Procedure: COLONOSCOPY;  Surgeon: Laurence Spates, MD;  Location[facility] ENDOSCOPY;  Service: Endoscopy;  Laterality: N/A;  . ESOPHAGOGASTRODUODENOSCOPY N/A 01/13/2015   Procedure: ESOPHAGOGASTRODUODENOSCOPY (EGD);  Surgeon: Laurence Spates, MD;  Location: [facility] ENDOSCOPY;  Service: Endoscopy;  Laterality: N/A;  . INSERT / REPLACE / REMOVE PACEMAKER  11/2015  . TOTAL HIP ARTHROPLASTY Bilateral    History reviewed. No pertinent family history.  Social History:   reports that she has never smoked. She has never used smokeless tobacco. She reports that she does not drink alcohol or use drugs.  Allergies: No Known Allergies  Medications: I have reviewed the patient's current medications.  Results for orders placed or performed during the hospital encounter of 07/29/16 (from the past 48 hour(s))  Glucose, capillary     Status: None   Collection Time: 07/29/16  5:06 PM  Result Value Ref Range   Glucose-Capillary 81 65 - 99 mg/dL  Glucose, capillary     Status: None   Collection Time: 07/29/16  9:50 PM  Result Value Ref Range   Glucose-Capillary 66 65 - 99 mg/dL  Glucose, capillary     Status: None   Collection Time: 07/30/16 12:11 AM  Result Value Ref Range   Glucose-Capillary 87 65 - 99 mg/dL  CBC     Status: Abnormal   Collection Time: 07/30/16  6:48 AM  Result Value Ref Range   WBC 7.7 4.0 - 10.5 K/uL   RBC 3.06 (L) 3.87 - 5.11 MIL/uL   Hemoglobin 9.4 (L) 12.0 - 15.0 g/dL   HCT 28.6 (L) 36.0 - 46.0 %   MCV 93.5 78.0 - 100.0 fL   MCH 30.7 26.0 - 34.0 pg   MCHC 32.9 30.0 - 36.0 g/dL   RDW 14.6 11.5 - 15.5 %   Platelets 269 150 - 400 K/uL  Basic metabolic panel     Status: Abnormal   Collection Time: 07/30/16  6:48 AM  Result  Value Ref Range   Sodium 141 135 - 145 mmol/L   Potassium 3.0 (L) 3.5 - 5.1 mmol/L   Chloride 108 101 - 111 mmol/L   CO2 27 22 - 32 mmol/L   Glucose, Bld 83 65 - 99 mg/dL   BUN 14 6 - 20 mg/dL   Creatinine, Ser 0.58 0.44 - 1.00 mg/dL   Calcium 8.7 (L) 8.9 - 10.3 mg/dL   GFR calc non Af Amer >60 >60 mL/min   GFR calc Af Amer >60 >60 mL/min    Comment: (NOTE) The eGFR has been calculated using the CKD EPI equation. This calculation has not been validated in all clinical situations. eGFR's persistently <60 mL/min signify possible Chronic Kidney Disease.    Anion gap 6 5 - 15  Glucose, capillary     Status: None   Collection Time: 07/30/16  7:54 AM  Result Value Ref Range   Glucose-Capillary 85 65 - 99 mg/dL   Glucose, capillary     Status: None   Collection Time: 07/30/16 11:41 AM  Result Value Ref Range   Glucose-Capillary 75 65 - 99 mg/dL  Glucose, capillary     Status: None   Collection Time: 07/30/16  5:32 PM  Result Value Ref Range   Glucose-Capillary 73 65 - 99 mg/dL   Review of Systems  Constitutional: Negative.   HENT: Negative.  Negative for ear pain.   Eyes: Negative.   Respiratory: Positive for shortness of breath.   Gastrointestinal: Positive for blood in stool and constipation. Negative for abdominal pain, diarrhea, heartburn, nausea and vomiting.  Genitourinary: Negative.   Musculoskeletal: Positive for back pain and joint pain.  Skin: Negative.   Neurological: Negative.   Endo/Heme/Allergies: Negative.   Psychiatric/Behavioral: Negative.    Blood pressure (!) 137/93, pulse (!) 102, temperature 97.5 F (36.4 C), temperature source Oral, resp. rate 17, height 5' 6"  (1.676 m), weight 55.3 kg (121 lb 14.4 oz), SpO2 98 %. Physical Exam  Constitutional: She is oriented to person, place, and time. She appears well-developed and well-nourished.  HENT:  Head: Normocephalic and atraumatic.  Eyes: Conjunctivae and EOM are normal. Pupils are equal, round, and reactive to light.  Neck: Normal range of motion. Neck supple.  Respiratory: Effort normal and breath sounds normal.  pacemaker present on left chest  GI: Soft. Bowel sounds are normal.  Neurological: She is alert and oriented to person, place, and time.  Skin: Skin is warm and dry.  Psychiatric: She has a normal mood and affect. Her speech is normal and behavior is normal. Judgment and thought content normal.   Assessment/Plan: 1) Rectal bleeding most probably from diverticular disease. Her rectal bleeding has resolved and her vital signs are stable at this time. I do not think she needs a workup at this time we will sign off please call if further help is needed. It might be safer to keep her on Aspirin only [facility]  cardiology is agreeable]. I have discussed my thoughts with Dr. Beryle Beams. 2) Chronic constipation-continue taking Miralax as before.  3) Iron deficiency anemia secondary to acute blood loss-needs close follow up and iron supplementation as needed. She may need to increase the dose of Miralax when she is on iron supplements to prevent worsening of her constipation.   Theseus Birnie 07/31/2016, 1:19 PM

## 2016-08-01 ENCOUNTER — Telehealth: Payer: Self-pay

## 2016-08-01 DIAGNOSIS — K625 Hemorrhage of anus and rectum: Secondary | ICD-10-CM

## 2016-08-01 DIAGNOSIS — I482 Chronic atrial fibrillation: Secondary | ICD-10-CM

## 2016-08-01 LAB — BASIC METABOLIC PANEL
Anion gap: 4 — ABNORMAL LOW (ref 5–15)
BUN: 7 mg/dL (ref 6–20)
CHLORIDE: 110 mmol/L (ref 101–111)
CO2: 27 mmol/L (ref 22–32)
CREATININE: 0.62 mg/dL (ref 0.44–1.00)
Calcium: 8.8 mg/dL — ABNORMAL LOW (ref 8.9–10.3)
GFR calc Af Amer: >60 mL/min (ref >60)
GFR calc non Af Amer: >60 mL/min (ref >60)
Glucose, Bld: 82 mg/dL (ref 65–99)
Potassium: 3.8 mmol/L (ref 3.5–5.1)
SODIUM: 141 mmol/L (ref 135–145)

## 2016-08-01 LAB — CBC
HCT: 30.9 % — ABNORMAL LOW (ref 36.0–46.0)
Hemoglobin: 9.7 g/dL — ABNORMAL LOW (ref 12.0–15.0)
MCH: 29.4 pg (ref 26.0–34.0)
MCHC: 31.4 g/dL (ref 30.0–36.0)
MCV: 93.6 fL (ref 78.0–100.0)
PLATELETS: 339 10*3/uL (ref 150–400)
RBC: 3.3 MIL/uL — ABNORMAL LOW (ref 3.87–5.11)
RDW: 14.2 % (ref 11.5–15.5)
WBC: 5.7 10*3/uL (ref 4.0–10.5)

## 2016-08-01 MED ORDER — FERROUS SULFATE 325 (65 FE) MG PO TABS: 325.0000 mg | ORAL_TABLET | Freq: Every day | ORAL | Status: DC

## 2016-08-01 MED ORDER — FERROUS SULFATE 325 (65 FE) MG PO TABS
325.0000 mg | ORAL_TABLET | Freq: Every day | ORAL | Status: DC
Start: 2016-08-02 — End: 2016-08-01
  Filled 2016-08-01: qty 1

## 2016-08-01 NOTE — Telephone Encounter (Signed)
Hospital TOC. 

## 2016-08-01 NOTE — Discharge Summary (Signed)
Medicine attending discharge note: I personally examined this patient on the day of discharge and I attest to the accuracy of the discharge evaluation and plan as recorded in the final progress note dated 08/01/2016 by resident physician Dr. Ledell Noss with further details to follow in her dictated discharge summary.  Clinical summary: Third admission since June 2016 for an acute diverticular bleed in this 83 year old woman with known cardiac disease, chronic atrial fibrillation, status post permanent pacemaker, previously on Eliquis anticoagulation stopped during the June 2016 admission.Due to persistent bleeding during that admission she underwent a colonoscopy with findings of diffuse, severe, diverticulosis. She was readmitted on 06/09/2016. She was back on anticoagulants which were discontinued permanently at that time. She was seen in consultation by gastroenterology. It was not felt that another colonoscopy would be instructive. A red blood cell scan was ordered but the study was negative. Surgical consultation was obtained but surgeons felt that in view of the extensive diverticulosis the only surgical procedure that would be indicated would be a total colectomy which they did not feel was indicated in this frail elderly woman with underlying cardiac disease. It is now just 2 months later and she again presents with the sudden onset of painless hematochezia.  Hospital course: She was hemodynamically stable at presentation and remained so for the duration of the hospital course. Pertinent physical findings included battery source for her pacemaker subcutaneous tissues left pectoral area. 3/6 systolic murmur at the left sternal border. Abdomen moderately distended, nontender, soft, tympanitic. Initial hemoglobin 11.8 g. Fell to 9.4. Was rising and was 9.7 on day of discharge January 11. Bleeding was self-limited and there was no further bleeding under direct observation. Gastroenterology was again  consulted. She saw a different GI specialist this admission compared with last. Consultant was kind enough to call me and discuss management. She reviewed the results of the previous colonoscopy which in addition to pandiverticulosis showed a very redundant colon. She felt that another colonoscopy would be very low yield especially since the patient stopped bleeding and remained hemodynamically stable. She recommended continuing an aggressive laxative regimen. Iron replacement.  Potassium fell to 3.0. Up to 3.8 at discharge after replacement.  EKG showed a paced atrial rhythm. The patient had no cardiac symptoms.  Disposition: Condition stable at time of discharge She will follow-up in a Gen. medicine clinic Anticoagulation permanently discontinued. She will continue low-dose aspirin. There were no complications.

## 2016-08-01 NOTE — Progress Notes (Signed)
  Subjective: Ms. Eyla Bookwalter  denies lightheadedness, chest pain, or shortness of breath today. She has had no further bloody bowel movements since the day of admission.   Objective:  Vital signs in last 24 hours: Vitals:   07/31/16 1106 07/31/16 1413 07/31/16 2147 08/01/16 0538  BP: (!) 137/93 129/60 128/66 123/61  Pulse: (!) 102 71 77 70  Resp:  18 18 17   Temp:  98.2 F (36.8 C) 98.1 F (36.7 C) 97.8 F (36.6 C)  TempSrc:  Oral Oral Oral  SpO2:  98% 97% 97%  Weight:      Height:       Physical Exam  Constitutional: She appears well-developed and well-nourished. No distress.  Eyes: Conjunctivae are normal. No scleral icterus.  Cardiovascular: Normal rate and regular rhythm.   Murmur heard. Systolic murmur   Pulmonary/Chest: Effort normal. No respiratory distress. She has no wheezes. She has no rales.  Abdominal: Soft. Bowel sounds are normal. She exhibits distension. There is no tenderness. There is no guarding.  Skin: She is not diaphoretic.     Assessment/Plan: Pt is a 83 y.o. yo female with a PMHx of diverticulosis, chronic atrial fibrillation, non insulin dependent type 2 DM,  who was admitted on 07/29/2016 with symptoms of painless bright red blood per rectum, which was determined to be secondary to lower GI bleed.   Principal Problem:   GI bleed Active Problems:   Cardiac pacemaker in situ   Atrial fibrillation (HCC)   Diverticulitis of colon with bleeding   Aortic stenosis  GI bleed  Acute bleeding diverticulosis Bleeding has resolved and she remains asymptomatic. GI evaluated her yesterday and feels with there resolution of her bleeding there is no need for colonoscopy at this time, they recommended daily laxative supplementation for her chronic constipation. Her hemoglobin is stable today and she has had no further bleeding or symptoms of anemia. She is stable for discharge at this time.  -will continue miralax daily at discharge  -continue oral iron  supplementation   Hypokalemia  Resolved   Chronic Atrial fibrillation Rate controlled in the 70s. CHADSVASC = 5 (age, gender, HTN, DM.)   -Continue aspirin and metoprolol succinate 25 mg qd   Non insulin dependent type 2 DM  CBG 70s on clear liquid diet   Dispo: Anticipated discharge today   Ledell Noss, MD 08/01/2016, 9:32 AM Pager: [phone]

## 2016-08-01 NOTE — Discharge Summary (Signed)
Name: Jill Short MRN: [id number] DOB: November 03, 1930 83 y.o. PCP: Florinda Marker, MD  Date of Admission: 07/29/2016  6:25 AM Date of Discharge: 08/01/2016 Attending Physician: Annia Belt, MD  Discharge Diagnosis: 1. Acute diverticular bleed  Discharge Medications: Allergies as of 08/01/2016   No Known Allergies     Medication List    TAKE these medications   aspirin EC 81 MG tablet Take 1 tablet (81 mg total) by mouth daily.   B-12 PO Take 1 tablet by mouth daily.   calcium-vitamin D 500-200 MG-UNIT tablet Commonly known as:  OSCAL WITH D Take 1 tablet by mouth daily with breakfast.   cholecalciferol 1000 units tablet Commonly known as:  VITAMIN D Take 1,000 Units by mouth daily.   fenofibrate micronized 200 MG capsule Commonly known as:  LOFIBRA Take 200 mg by mouth daily before breakfast.   ferrous sulfate 325 (65 FE) MG tablet TAKE ONE TABLET BY MOUTH DAILY WITH BREAKFAST   FLAX PO Take 1,200 mg by mouth daily.   metoprolol succinate 25 MG 24 hr tablet Commonly known as:  TOPROL-XL Take 25 mg by mouth daily.   pantoprazole 40 MG tablet Commonly known as:  PROTONIX TAKE ONE TABLET BY MOUTH DAILY   polycarbophil 625 MG tablet Commonly known as:  FIBERCON Take 1 tablet (625 mg total) by mouth daily.   polyethylene glycol packet Commonly known as:  MIRALAX / GLYCOLAX Take 17 g by mouth daily. What changed:  when to take this  reasons to take this   potassium chloride 10 MEQ tablet Commonly known as:  K-DUR Take 2 tablets (20 mEq total) by mouth daily.   vitamin C 500 MG tablet Commonly known as:  ASCORBIC ACID Take 500 mg by mouth daily.       Disposition and follow-up:   Jill Short was [facility] from [facility] in Good condition.  At the hospital follow up visit please address:  1.  Acute diverticular bleed- Has she had any new bleeding? Has she continued fiber and laxative supplementation?   2.  Labs /  imaging needed at time of follow-up: none   3.  Pending labs/ test needing follow-up: none   Follow-up Appointments: Follow-up Information    [place]. Schedule an appointment as soon as possible for a visit in 3 week(s).   Contact information: [address] [place] Hospital Course by problem list:    GI bleed  Diverticulosis of colon with bleeding 83 year old woman with PMH of known diverticulosis, chronic atrial fibrillation on aspirin, s/p pacemaker placement who presented after having a bright red bloody bowel movement. In the ED she was found to have normotensive BP, HR 80s, afebrile. Labs revealed hgb 11.8 with MCV 90, FOBT + and GI was consulted. EKG showed no changes suggestive of acute ischemia. She was started on IV fluid rehydration and monitored on telemetry. She had no further bloody bowel movements during the admission. GI evaluated her and felt with the resolution of her bleeding she did not need urgent colonoscopy and should be continued on bowel regiment for constipation. She was discharged with instructions to continue taking miralx daily and should not resume taking eliquis for afib. Her hemoglobin and vitals remained stable for the duration of her hospitalization. She was medically stable and felt ready to go home on the day of discharge.     Atrial  fibrillation [facility])   Cardiac pacemaker in situ CHADS-Vasc score of 5 (age, gender, HTN, DM) but given her history of diverticulosis with recurrent GI bleeds eliquis had been stopped on prior hospitalization and she was kept on aspirin. Her home medication metoprolol succinate 25 mg qd was continued and she remained rate controlled.   Discharge Vitals:   BP 123/61 (BP Location: Left Arm)   Pulse 70   Temp 97.8 F (36.6 C) (Oral)   Resp 17   Ht 5\' 6"  (1.676 m)   Wt 121 lb 14.4 oz (55.3 kg)   SpO2 97%   BMI 19.68 kg/m   Pertinent Labs,  Studies, and Procedures:  Procedures Performed:  No results found.  Consultations: Gastroenterology, Dr. Collene Mares   Discharge Instructions: Discharge Instructions    Call MD for:  difficulty breathing, headache or visual disturbances    Complete by:  As directed    Call MD for:  extreme fatigue    Complete by:  As directed    Call MD for:  persistant dizziness or light-headedness    Complete by:  As directed    Diet - low sodium heart healthy    Complete by:  As directed    Increase activity slowly    Complete by:  As directed       Signed: Ledell Noss, MD 08/08/2016, 6:19 PM   Pager: [phone]

## 2016-08-14 NOTE — Telephone Encounter (Signed)
Transition Care Management Follow-up Telephone Call   Date discharged?1/11   How have you been since you were released from the hospital? "good, no problems"   Do you understand why you were in the hospital? yes   Do you understand the discharge instructions? yes   Where were you discharged to? home   Items Reviewed:  Medications reviewed: yes  Allergies reviewed: yes, no changes  Dietary changes reviewed: yes, no changes  Referrals reviewed: yes   Functional Questionnaire:   Activities of Daily Living (ADLs):   She states they are independent in the following: i can do it myself, my family always helps States they require assistance with the following: nothing   Any transportation issues/concerns?: no   Any patient concerns? no   Confirmed importance and date/time of follow-up visits scheduled yes  Provider Appointment booked with [facility]  Confirmed with patient if condition begins to worsen call PCP or go to the ER.  Patient was given the office number and encouraged to call back with question or concerns.  : yes, she was very agreeable

## 2016-08-20 ENCOUNTER — Ambulatory Visit (INDEPENDENT_AMBULATORY_CARE_PROVIDER_SITE_OTHER): Payer: Medicare Other | Admitting: Internal Medicine

## 2016-08-20 VITALS — BP 142/63 | HR 70 | Temp 97.9°F | Ht 66.0 in | Wt 127.9 lb

## 2016-08-20 DIAGNOSIS — Z7982 Long term (current) use of aspirin: Secondary | ICD-10-CM

## 2016-08-20 DIAGNOSIS — K922 Gastrointestinal hemorrhage, unspecified: Secondary | ICD-10-CM

## 2016-08-20 DIAGNOSIS — E876 Hypokalemia: Secondary | ICD-10-CM | POA: Diagnosis not present

## 2016-08-20 DIAGNOSIS — K625 Hemorrhage of anus and rectum: Secondary | ICD-10-CM | POA: Diagnosis not present

## 2016-08-20 DIAGNOSIS — K5730 Diverticulosis of large intestine without perforation or abscess without bleeding: Secondary | ICD-10-CM

## 2016-08-20 DIAGNOSIS — K2971 Gastritis, unspecified, with bleeding: Secondary | ICD-10-CM

## 2016-08-20 DIAGNOSIS — Z5189 Encounter for other specified aftercare: Secondary | ICD-10-CM

## 2016-08-20 MED ORDER — POTASSIUM CHLORIDE ER 10 MEQ PO TBCR: 20.0000 meq | EXTENDED_RELEASE_TABLET | Freq: Every day | ORAL | 1 refills | Status: DC

## 2016-08-20 MED ORDER — FERROUS SULFATE 325 (65 FE) MG PO TABS: 325.0000 mg | ORAL_TABLET | Freq: Every day | ORAL | 11 refills | Status: DC

## 2016-08-20 NOTE — Assessment & Plan Note (Signed)
Had low K on previously 2 hospitalizations. Started on K supplement at last discharge. Reports compliance. No CP or palpations.  Assessment: Hypokalemia  Plan: bmet today Continue kdur 20 meq daily

## 2016-08-20 NOTE — Progress Notes (Signed)
   CC: Hospital follow up - rectal bleeding  HPI:  [name] is a 83 y.o. female with a past medical history listed below here today for follow up for her recent hospitalization for rectal bleeding.  For details of today's visit and the status of her chronic medical issues please refer to the assessment and plan.   Past Medical History:  Diagnosis Date  . Arthritis   . Atrial fibrillation ([place])   . Coronary artery disease   . Diverticulosis   . Hypertension   . Lower GI bleed   . Pacemaker   . Rectal bleed 07/2016    Review of Systems:   See hpi   Physical Exam:  Vitals:   08/20/16 1540  BP: (!) 142/63  Pulse: 70  Temp: 97.9 F (36.6 C)  TempSrc: Oral  SpO2: 100%  Weight: 127 lb 14.4 oz (58 kg)  Height: 5\' 6"  (1.676 m)   Physical Exam  Constitutional:  Thin, frail elderly woman in no acute distress  Eyes: Conjunctivae are normal.  Cardiovascular: Normal rate and regular rhythm.   Murmur heard. Pulmonary/Chest: Effort normal and breath sounds normal.  Abdominal: Soft. Bowel sounds are normal. She exhibits no distension. There is no tenderness. There is no rebound.  Skin: Skin is warm and dry.     Assessment & Plan:   See Encounters Tab for problem based charting.  Patient discussed with Dr. Dareen Piano

## 2016-08-20 NOTE — Assessment & Plan Note (Signed)
Recently discharged from the hospital for GI bleed. This was the 3rd admission in the past 8 months for GI bleeding. She was previously on Eliquis for A.fib which were stopped at 12/2015 hospitalization. Colonoscopy at that time showed diffuse, severe diverticulosis. After that hospitalization she was restarted on Eliquis at some point and re-admitted in 05/2016 with further GI bleeding. Anticoagulation was stopped permananetly at that time and she is on ASA only now. Red cell scan was negative at that time. Surgery was consulted and the only option given her extensive diverticular disease would be a total colectomy which given the patient's age and health would not be indicated. She presented for the third time with painless hematochezia earlier this month.   She was hemodynamically stable throughout the hospitalization. The bleeding was self limited an resolved on its own. GI felt that repeat colonoscopy would be very low yield. Recommended aggressive laxative regimen and Fe replacement.   Today, she reports she has had no further melena or hematochezia. Feeling much better. Denies any chest pain, shortness of breath, abdominal pain, fatigue. She has been taking the miralax daily with daily bowel movements. Was confused about the Fe supplement and has not been taking it   Assessment: recurrent diverticular bleeding  Plan: Restart Fe supplement today Continue bowel regimen Re-check CBC today

## 2016-08-20 NOTE — Patient Instructions (Addendum)
Jill Short,  I am glad you are doing better. I am going to check some blood work today.   Please follow up with Dr. Quay Burow in in the next 1-2 months.

## 2016-08-21 LAB — BMP8+ANION GAP
Anion Gap: 14 mmol/L (ref 10.0–18.0)
BUN / CREAT RATIO: 28 (ref 12–28)
BUN: 19 mg/dL (ref 8–27)
CHLORIDE: 105 mmol/L (ref 96–106)
CO2: 23 mmol/L (ref 18–29)
Calcium: 9.4 mg/dL (ref 8.7–10.3)
Creatinine, Ser: 0.67 mg/dL (ref 0.57–1.00)
GFR calc non Af Amer: 82 mL/min/{1.73_m2} (ref >59)
GFR, EST AFRICAN AMERICAN: 94 mL/min/{1.73_m2} (ref >59)
GLUCOSE: 77 mg/dL (ref 65–99)
POTASSIUM: 5.2 mmol/L (ref 3.5–5.2)
Sodium: 142 mmol/L (ref 134–144)

## 2016-08-21 LAB — CBC
HEMATOCRIT: 33.9 % — AB (ref 34.0–46.6)
Hemoglobin: 10.5 g/dL — ABNORMAL LOW (ref 11.1–15.9)
MCH: 29 pg (ref 26.6–33.0)
MCHC: 31 g/dL — AB (ref 31.5–35.7)
MCV: 94 fL (ref 79–97)
PLATELETS: 475 10*3/uL — AB (ref 150–379)
RBC: 3.62 x10E6/uL — ABNORMAL LOW (ref 3.77–5.28)
RDW: 14 % (ref 12.3–15.4)
WBC: 7.2 10*3/uL (ref 3.4–10.8)

## 2016-08-21 NOTE — Progress Notes (Addendum)
Internal Medicine Clinic Attending  Case discussed with Dr. Charlynn Grimes at the time of the visit.  We reviewed the resident's history and exam and pertinent patient test results.  I agree with the assessment, diagnosis, and plan of care documented in the resident's note.  Patient's hemoglobin is stable from discharge and hypokalemia has resolved

## 2016-08-30 ENCOUNTER — Encounter (INDEPENDENT_AMBULATORY_CARE_PROVIDER_SITE_OTHER): Payer: Self-pay

## 2016-08-30 ENCOUNTER — Encounter: Payer: Self-pay | Admitting: Internal Medicine

## 2016-08-30 ENCOUNTER — Ambulatory Visit (INDEPENDENT_AMBULATORY_CARE_PROVIDER_SITE_OTHER): Payer: Medicare Other | Admitting: Internal Medicine

## 2016-08-30 ENCOUNTER — Telehealth: Payer: Self-pay

## 2016-08-30 VITALS — BP 130/62 | HR 71 | Temp 99.0°F | Wt 128.5 lb

## 2016-08-30 DIAGNOSIS — K449 Diaphragmatic hernia without obstruction or gangrene: Secondary | ICD-10-CM | POA: Diagnosis not present

## 2016-08-30 DIAGNOSIS — Z79899 Other long term (current) drug therapy: Secondary | ICD-10-CM | POA: Diagnosis not present

## 2016-08-30 DIAGNOSIS — Z7982 Long term (current) use of aspirin: Secondary | ICD-10-CM

## 2016-08-30 DIAGNOSIS — K921 Melena: Secondary | ICD-10-CM | POA: Diagnosis present

## 2016-08-30 DIAGNOSIS — E876 Hypokalemia: Secondary | ICD-10-CM | POA: Diagnosis not present

## 2016-08-30 DIAGNOSIS — K922 Gastrointestinal hemorrhage, unspecified: Secondary | ICD-10-CM

## 2016-08-30 DIAGNOSIS — K5731 Diverticulosis of large intestine without perforation or abscess with bleeding: Secondary | ICD-10-CM | POA: Diagnosis not present

## 2016-08-30 LAB — BASIC METABOLIC PANEL
ANION GAP: 7 (ref 5–15)
BUN: 21 mg/dL — ABNORMAL HIGH (ref 6–20)
CALCIUM: 9.1 mg/dL (ref 8.9–10.3)
CO2: 26 mmol/L (ref 22–32)
Chloride: 109 mmol/L (ref 101–111)
Creatinine, Ser: 0.98 mg/dL (ref 0.44–1.00)
GFR calc Af Amer: 60 mL/min — ABNORMAL LOW (ref >60)
GFR, EST NON AFRICAN AMERICAN: 52 mL/min — AB (ref >60)
GLUCOSE: 101 mg/dL — AB (ref 65–99)
POTASSIUM: 4.1 mmol/L (ref 3.5–5.1)
SODIUM: 142 mmol/L (ref 135–145)

## 2016-08-30 LAB — CBC
HCT: 32.6 % — ABNORMAL LOW (ref 36.0–46.0)
Hemoglobin: 10.3 g/dL — ABNORMAL LOW (ref 12.0–15.0)
MCH: 29.1 pg (ref 26.0–34.0)
MCHC: 31.6 g/dL (ref 30.0–36.0)
MCV: 92.1 fL (ref 78.0–100.0)
PLATELETS: 416 10*3/uL — AB (ref 150–400)
RBC: 3.54 MIL/uL — AB (ref 3.87–5.11)
RDW: 15 % (ref 11.5–15.5)
WBC: 7.6 10*3/uL (ref 4.0–10.5)

## 2016-08-30 MED ORDER — DOCUSATE SODIUM 100 MG PO CAPS: 100.0000 mg | ORAL_CAPSULE | Freq: Two times a day (BID) | ORAL | 2 refills | Status: DC | PRN

## 2016-08-30 NOTE — Telephone Encounter (Signed)
Agree 

## 2016-08-30 NOTE — Progress Notes (Signed)
CC: Melena  HPI: [name] is a 83 y.o. female with PMHx of chronic GI bleed due to diverticular bleed, aortic stenosis, atrial fibrillation, coronary artery disease, hyperlipidemia who presents to the clinic for complaint of melena.   Patient has a chronic history of GI bleeds dating back in our system to June 2016. She has been admitted 4 times since June 2016 for GI bleeds. Her most recent admission was on 07/29/2016 for an acute diverticular bleed. Patient was previously on [facility] which was stopped at the initial admission in June 2016. During that first admission patient underwent a colonoscopy with findings of diffuse, severe diverticulosis. She was later readmitted in November 2017. She had been restarted on her anticoagulants prior to her second readmission. However during the second admission her anticoagulants were permanently discontinued. A red blood cell scan was ordered during her second admission but negative. Surgery consultation felt that in view of her extensive diverticulosis the only surgical procedure available for her would be a total colectomy which they did not feel is indicated given her frailty and age and underlying cardiac disease. Again in January 2018 patient was admitted after experiencing a bright red bloody bowel movement. On admission in the emergency department, she was normotensive heart rate in the 80s afebrile. Hemoglobin was stable at 11.8, FOBT was positive and GI was consulted. Patient was started on IV rehydration and monitored on telemetry. She had no further bloody bowel movements during the admission. GI evaluated her and felt that given her bleeding had resolved she did not need an urgent colonoscopy and she should be continued on her current bowel regimen to avoid constipation which includes daily MiraLAX. Patient is supposed to be on aspirin 81 mg daily, ferrous sulfate 325 mg daily, pantoprazole 40 mg daily, potassium 20 mEq daily. Of note,  gastroenterology has felt that a repeat colonoscopy would be very low yield unless possibly a patient started to rebleed.   The patient was seen for hospital follow-up on 08/20/2016. At that time, patient denied any further melena or hematochezia. She was feeling much better and denied any chest pain, shortness of breath, or abdominal pain. She reported compliance with MiraLAX daily with daily bowel movements. She had not been taken iron supplementation. Her potassium during that visit was 5.2. Her hemoglobin was 10.5, increased from discharge at 9.7.  I reviewed the operative reports from patient's EGD and colonoscopy. Her EGD showed a hiatal hernia with no evidence of esophagitis or other source of GI bleeding. Again, colonoscopy showed severe diverticulosis throughout the entire examined colon but no signs of active bleeding.  Today, patient presents with complaint of black stools for the past 2 days. She states her stools are hard, she has about one bowel movement per day, but it consists mostly of round hard balls of black stool. She denies seeing any blood clots, bright red blood, or loose stools. She is taking iron, but she has been taking this "forever". She denies any associated lightheadedness, chest pain, shortness of breath, nausea, vomiting, abdominal pain. Stat CBC showed stable hemoglobin at 10.3. Previous CBC showed hemoglobin of 10.5. She is normotensive, not tachycardic, however she is on a beta blocker. Bmet is within normal limits.  Past Medical History:  Diagnosis Date  . Arthritis   . Atrial fibrillation ([place])   . Coronary artery disease   . Diverticulosis   . Hypertension   . Lower GI bleed   . Pacemaker   . Rectal bleed  07/2016    Review of Systems: Please see pertinent ROS reviewed in HPI and problem based charting.   Physical Exam: Vitals:   08/30/16 1518  BP: 130/62  Pulse: 71  SpO2: 100%  Weight: 128 lb 8 oz (58.3 kg)   General: Vital signs reviewed.   Patient is Elderly, frail, in no acute distress and cooperative with exam.  Eyes: No conjunctival pallor Cardiovascular: RRR, 2/6 systolic ejection murmur. Pulmonary/Chest: Clear to auscultation bilaterally, no wheezes, rales, or rhonchi. Abdominal: Soft, non-tender, non-distended, BS +  Extremities: No lower extremity edema bilaterally Skin: Warm, dry and intact.  Psychiatric: Normal mood and affect. speech and behavior is normal.   Assessment & Plan:  See encounters tab for problem based medical decision making. Patient discussed with Dr. Beryle Beams

## 2016-08-30 NOTE — Telephone Encounter (Signed)
Needs to speak with a nurse regarding black stool. Please call back.

## 2016-08-30 NOTE — Assessment & Plan Note (Signed)
Patient has a chronic history of GI bleeds dating back in our system to June 2016. She has been admitted 4 times since June 2016 for GI bleeds. Her most recent admission was on 07/29/2016 for an acute diverticular bleed. [facility] was [facility] which was stopped at the initial admission in June 2016. During that first admission patient underwent a colonoscopy with findings of diffuse, severe diverticulosis. She was later readmitted in November 2017. She had been restarted on her anticoagulants prior to her second readmission. However during the second admission her anticoagulants were permanently discontinued. A red blood cell scan was ordered during her second admission but negative. Surgery consultation felt that in view of her extensive diverticulosis the only surgical procedure available for her would be a total colectomy which they did not feel is indicated given her frailty and age and underlying cardiac disease. Again in January 2018 patient was admitted after experiencing a bright red bloody bowel movement. On admission in the emergency department, she was normotensive heart rate in the 80s afebrile. Hemoglobin was stable at 11.8, FOBT was positive and GI was consulted. Patient was started on IV rehydration and monitored on telemetry. She had no further bloody bowel movements during the admission. GI evaluated her and felt that given her bleeding had resolved she did not need an urgent colonoscopy and she should be continued on her current bowel regimen to avoid constipation which includes daily MiraLAX. Patient is supposed to be on aspirin 81 mg daily, ferrous sulfate 325 mg daily, pantoprazole 40 mg daily, potassium 20 mEq daily. Of note, gastroenterology has felt that a repeat colonoscopy would be very low yield unless possibly a patient started to rebleed.   The patient was seen for hospital follow-up on 08/20/2016. At that time, patient denied any further melena or hematochezia. She was  feeling much better and denied any chest pain, shortness of breath, or abdominal pain. She reported compliance with MiraLAX daily with daily bowel movements. She had not been taken iron supplementation. Her potassium during that visit was 5.2. Her hemoglobin was 10.5, increased from discharge at 9.7.  I reviewed the operative reports from patient's EGD and colonoscopy. Her EGD showed a hiatal hernia with no evidence of esophagitis or other source of GI bleeding. Again, colonoscopy showed severe diverticulosis throughout the entire examined colon but no signs of active bleeding.  Today, patient presents with complaint of black stools for the past 2 days. She states her stools are hard, she has about one bowel movement per day, but it consists mostly of round hard balls of black stool. She denies seeing any blood clots, bright red blood, or loose stools. She is taking iron, but she has been taking this "forever". She denies any associated lightheadedness, chest pain, shortness of breath, nausea, vomiting, abdominal pain. Stat CBC showed stable hemoglobin at 10.3. Previous CBC showed hemoglobin of 10.5. She is normotensive, not tachycardic, however she is on a beta blocker. Bmet is within normal limits. Given that the patient is hemodynamically stable and asymptomatic, we can continue to monitor her for now. Unfortunately, we do not have many options to resolve this chronic issue for her. A colonoscopy would be unlikely to show the source of her diverticular bleeding unless she had diffuse active bleeding. Patient is not a surgical candidate for colectomy as indicated by prior surgeons and furthermore patient does not want to pursue surgery. I discussed this plan with Dr. Beryle Beams and the patient's daughter. We will continue to  monitor the patient clinically. If she develops worsening black stool output, or new bright red blood, lightheadedness, chest pain, or shortness of breath she will need a repeat CBC and  likely admission. Patient and family are in agreement with this. I went over thoroughly return precautions for them and provided them with the on-call number for after hours and on the weekend if any issues should arise. I also added on docusate given patient's hard stools in addition to her MiraLAX.

## 2016-08-30 NOTE — Assessment & Plan Note (Signed)
Patient has history of hypokalemia. She has been compliant with potassium 20 mEq daily. Repeat basic metabolic panel today shows potassium 4.1.  Plan: -Continue potassium 20 mEq daily

## 2016-08-30 NOTE — Patient Instructions (Addendum)
Ms. Leak,  Continue taking all medications as prescribed. I have added docusate (colace) which is a stool softener, but not a laxative, to help you have a bowel movement. Continue taking Miralax.   Your hemoglobin is stable, so we can continue to monitor you. If you develop chest pain, shortness of breath, worsening bleeding with more black stools, bright red blood, blood clots, lightheadedness please let us know.   You can call the on call phone number to talk to a resident at night or after hours.   Follow up in one month, sooner if needed

## 2016-08-30 NOTE — Progress Notes (Signed)
Medicine attending: I personally interviewed and briefly examined this patient on the day of the patient visit and reviewed pertinent clinical ,laboratory, and radiographic data  with resident physician Dr. Martyn Malay and we discussed a management plan. Known extensive diverticulosis with recurrent L:GI bleeds. Now w recurrent melena. Normotensive. No tachycardia but on beta-blocker. Hb 10 - no change from hospital discharge value last month. Stable for outpatient follow up. Advised to call ambulance if she develops recurrent hematochezia. Unfortunately, not much to offer her from GI or General Surgery services.  Daughter here. Recs reviewed.

## 2016-08-30 NOTE — Telephone Encounter (Signed)
Spoke w/ daughter she states with pt's past hx she wants to check about 2 day hx of black tarry stools, states pt denies shortness of breath, chest pain, abd tenderness, firming of abd., weakness, dizziness, h/a, diaphoresis. States she will be away on business this weekend and would rather have this assessed than leave parents alone w/o knowing. appt given [facility] 1515

## 2016-09-18 ENCOUNTER — Telehealth: Payer: Self-pay | Admitting: *Deleted

## 2016-09-18 ENCOUNTER — Encounter (HOSPITAL_COMMUNITY): Payer: Self-pay | Admitting: Emergency Medicine

## 2016-09-18 ENCOUNTER — Emergency Department (HOSPITAL_COMMUNITY)
Admission: EM | Admit: 2016-09-18 | Discharge: 2016-09-18 | Disposition: A | Discharge status: Home / Self Care | Payer: Medicare Other | Attending: Emergency Medicine | Admitting: Emergency Medicine

## 2016-09-18 DIAGNOSIS — R42 Dizziness and giddiness: Secondary | ICD-10-CM | POA: Diagnosis not present

## 2016-09-18 DIAGNOSIS — I10 Essential (primary) hypertension: Secondary | ICD-10-CM | POA: Diagnosis not present

## 2016-09-18 DIAGNOSIS — I2510 Atherosclerotic heart disease of native coronary artery without angina pectoris: Secondary | ICD-10-CM | POA: Diagnosis not present

## 2016-09-18 DIAGNOSIS — Z7982 Long term (current) use of aspirin: Secondary | ICD-10-CM | POA: Insufficient documentation

## 2016-09-18 DIAGNOSIS — Z950 Presence of cardiac pacemaker: Secondary | ICD-10-CM | POA: Insufficient documentation

## 2016-09-18 DIAGNOSIS — Z79899 Other long term (current) drug therapy: Secondary | ICD-10-CM | POA: Insufficient documentation

## 2016-09-18 LAB — BASIC METABOLIC PANEL
Anion gap: 10 (ref 5–15)
BUN: 23 mg/dL — AB (ref 6–20)
CO2: 24 mmol/L (ref 22–32)
CREATININE: 0.71 mg/dL (ref 0.44–1.00)
Calcium: 10.1 mg/dL (ref 8.9–10.3)
Chloride: 104 mmol/L (ref 101–111)
GFR calc Af Amer: >60 mL/min (ref >60)
GFR calc non Af Amer: >60 mL/min (ref >60)
GLUCOSE: 97 mg/dL (ref 65–99)
POTASSIUM: 4.3 mmol/L (ref 3.5–5.1)
Sodium: 138 mmol/L (ref 135–145)

## 2016-09-18 LAB — CBC
HEMATOCRIT: 40 % (ref 36.0–46.0)
Hemoglobin: 13 g/dL (ref 12.0–15.0)
MCH: 29.7 pg (ref 26.0–34.0)
MCHC: 32.5 g/dL (ref 30.0–36.0)
MCV: 91.3 fL (ref 78.0–100.0)
PLATELETS: 354 10*3/uL (ref 150–400)
RBC: 4.38 MIL/uL (ref 3.87–5.11)
RDW: 15.3 % (ref 11.5–15.5)
WBC: 7 10*3/uL (ref 4.0–10.5)

## 2016-09-18 LAB — CBG MONITORING, ED: Glucose-Capillary: 82 mg/dL (ref 65–99)

## 2016-09-18 NOTE — ED Triage Notes (Signed)
Pt sts dizziness upon waking at 1000; pt sts some nausea but resolved at present

## 2016-09-18 NOTE — Telephone Encounter (Signed)
Thank you, I agree. 

## 2016-09-18 NOTE — Telephone Encounter (Signed)
Pt's daughter calls and states pt is very lethargic this am and is "out of it" she is ask to explain and states pt is slow to respond and not speaking clearly and currently, responses are not appropriate. She is advised to call 911 and have pt brought to [place], she is agreeable

## 2016-09-18 NOTE — ED Provider Notes (Signed)
[facility] DEPT Provider Note   CSN: [id number] Arrival date & time: 09/18/16  1156     History   Chief Complaint Chief Complaint  Patient presents with  . Dizziness    HPI Jill Short is a 83 y.o. female.  HPI Patient presents emergency department after developing acute dizziness today.  She states that she felt the room spinning this lasted for a few seconds and then completely resolved.  She is asymptomatic at this time but given her advanced age she states that she wanted to be checked out.  No headache.  No weakness of her arms or legs.  No confusion.  Family reports no change in her speech or no facial asymmetry.  She has no complaints at this time would like to go home.  No preceding symptoms.  This occurred right after she awoke this morning and was transient and again lasted only a few seconds.  Her husband states she looks normal to him.   Past Medical History:  Diagnosis Date  . Arthritis   . Atrial fibrillation ([place])   . Coronary artery disease   . Diverticulosis   . Hypertension   . Lower GI bleed   . Pacemaker   . Rectal bleed 07/2016    Patient Active Problem List   Diagnosis Date Noted  . Hypokalemia 06/16/2016  . Aortic stenosis 06/09/2016  . Coronary artery disease 06/09/2016  . Hyperlipemia 05/17/2016  . Cardiac pacemaker in situ 01/02/2015  . Paroxysmal atrial fibrillation ([place]) 01/02/2015  . Chronic GI bleeding 01/01/2015    Past Surgical History:  Procedure Laterality Date  . COLONOSCOPY N/A 01/06/2015   Procedure: COLONOSCOPY;  Surgeon: Laurence Spates, MD;  Location: [facility] ENDOSCOPY;  Service: Endoscopy;  Laterality: N/A;  . ESOPHAGOGASTRODUODENOSCOPY N/A 01/13/2015   Procedure: ESOPHAGOGASTRODUODENOSCOPY (EGD);  Surgeon: Laurence Spates, MD;  Location: [facility] ENDOSCOPY;  Service: Endoscopy;  Laterality: N/A;  . INSERT / REPLACE / REMOVE PACEMAKER  11/2015  . TOTAL HIP ARTHROPLASTY Bilateral     OB History    No data available       Home  Medications    Prior to Admission medications   Medication Sig Start Date End Date Taking? Authorizing Provider  aspirin EC 81 MG tablet Take 1 tablet (81 mg total) by mouth daily. 06/24/16 06/24/17  [name], MD  calcium-vitamin D (OSCAL WITH D) 500-200 MG-UNIT tablet Take 1 tablet by mouth daily with breakfast. 06/14/16   [name], MD  cholecalciferol (VITAMIN D) 1000 UNITS tablet Take 1,000 Units by mouth daily.    Historical Provider, MD  Cyanocobalamin (B-12 PO) Take 1 tablet by mouth daily.    Historical Provider, MD  docusate sodium (COLACE) 100 MG capsule Take 1 capsule (100 mg total) by mouth 2 (two) times daily as needed for mild constipation. 08/30/16   Alexa Angela Burke, MD  fenofibrate micronized (LOFIBRA) 200 MG capsule Take 200 mg by mouth daily before breakfast.    Historical Provider, MD  ferrous sulfate 325 (65 FE) MG tablet Take 1 tablet (325 mg total) by mouth daily with breakfast. 08/20/16   Maryellen Pile, MD  Flaxseed, Linseed, (FLAX PO) Take 1,200 mg by mouth daily.    Historical Provider, MD  metoprolol succinate (TOPROL-XL) 25 MG 24 hr tablet Take 25 mg by mouth daily.    Historical Provider, MD  pantoprazole (PROTONIX) 40 MG tablet TAKE ONE TABLET BY MOUTH DAILY 02/16/15   Alexa Angela Burke, MD  polycarbophil (FIBERCON) 625 MG tablet Take 1 tablet (  625 mg total) by mouth daily. 06/15/16   [name], MD  polyethylene glycol [facility] / Floria Raveling) packet Take 17 g by mouth daily. Patient taking differently: Take 17 g by mouth daily as needed for mild constipation.  06/15/16   [name], MD  potassium chloride (K-DUR) 10 MEQ tablet Take 2 tablets (20 mEq total) by mouth daily. 08/20/16   Maryellen Pile, MD  vitamin C (ASCORBIC ACID) 500 MG tablet Take 500 mg by mouth daily.    Historical Provider, MD    Family History History reviewed. No pertinent family history.  Social History Social History  Substance Use Topics  . Smoking status: Never Smoker  . Smokeless  tobacco: Never Used  . Alcohol use No     Allergies   Patient has no known allergies.   Review of Systems Review of Systems  All other systems reviewed and are negative.    Physical Exam Updated Vital Signs BP 141/94   Pulse 93   Temp 98.7 F (37.1 C) (Oral)   Resp 22   SpO2 99%   Physical Exam  Constitutional: She is oriented to person, place, and time. She appears well-developed and well-nourished. No distress.  HENT:  Head: Normocephalic and atraumatic.  Eyes: EOM are normal. Pupils are equal, round, and reactive to light.  Neck: Normal range of motion.  Cardiovascular: Normal rate, regular rhythm and normal heart sounds.   Pulmonary/Chest: Effort normal and breath sounds normal.  Abdominal: Soft. She exhibits no distension. There is no tenderness.  Musculoskeletal: Normal range of motion.  Neurological: She is alert and oriented to person, place, and time.  5/5 strength in major muscle groups of  bilateral upper and lower extremities. Speech normal. No facial asymetry.   Skin: Skin is warm and dry.  Psychiatric: She has a normal mood and affect. Judgment normal.  Nursing note and vitals reviewed.    ED Treatments / Results  Labs (all labs ordered are listed, but only abnormal results are displayed) Labs Reviewed  BASIC METABOLIC PANEL - Abnormal; Notable for the following:       Result Value   BUN 23 (*)    All other components within normal limits  CBC  CBG MONITORING, ED    EKG  EKG Interpretation  Date/Time:  Wednesday September 18 2016 12:13:23 EST Ventricular Rate:  70 PR Interval:  352 QRS Duration: 110 QT Interval:  430 QTC Calculation: 464 R Axis:   -54 Text Interpretation:  Atrial-paced rhythm with prolonged AV conduction Left anterior fascicular block Lateral infarct , age undetermined Abnormal ECG No significant change was found Confirmed by Latasia Silberstein  MD, Lennette Bihari ([id number]) on 09/18/2016 3:12:50 PM       Radiology No results  found.  Procedures Procedures (including critical care time)  Medications Ordered in ED Medications - No data to display   Initial Impression / Assessment and Plan / ED Course  I have reviewed the triage vital signs and the nursing notes.  Pertinent labs & imaging results that were available during my care of the patient were reviewed by me and considered in my medical decision making (see chart for details).     Well-appearing.  Asymptomatic at this time.  Nonfocal neurologic exam.  No indication for imaging.  Likely transient vertigo that resolved.  Close primary care follow-up.  She understands to return to the ER for new or worsening symptoms    Final Clinical Impressions(s) / ED Diagnoses   Final diagnoses:  Vertigo  New Prescriptions New Prescriptions   No medications on file     Jola Schmidt, MD 09/18/16 1557

## 2016-09-19 ENCOUNTER — Telehealth: Payer: Self-pay

## 2016-09-19 NOTE — Telephone Encounter (Signed)
Needs to speak with a nurse regarding meds. Please call back.  

## 2016-09-19 NOTE — Telephone Encounter (Signed)
Pt's daughter calls and ask if pt could have some meclizine due to the vertigo she was diagnosed with yesterday, was offered an appt, refused due to ED visit yesterday and the daughter/ caretaker is at the [employer] games today Could you please send a script to pharm?

## 2016-09-19 NOTE — Telephone Encounter (Signed)
I have reviewed the patient's ED visit note. It may have been Benign Positional Vertigo; however, the correct treatment is not meclizine for this diagnosis. Could you please let the patient's daughter know that we must evaluate her before prescribing medications and not on the basis of other physician's records. We would be happy to see her in clinic. We could offer her different treatment for BPPV if that is indeed what she has.   Thank you! [place]

## 2016-09-20 ENCOUNTER — Ambulatory Visit (INDEPENDENT_AMBULATORY_CARE_PROVIDER_SITE_OTHER): Payer: Medicare Other | Admitting: Internal Medicine

## 2016-09-20 DIAGNOSIS — Z5189 Encounter for other specified aftercare: Secondary | ICD-10-CM

## 2016-09-20 DIAGNOSIS — R42 Dizziness and giddiness: Secondary | ICD-10-CM

## 2016-09-20 DIAGNOSIS — Z7982 Long term (current) use of aspirin: Secondary | ICD-10-CM | POA: Diagnosis not present

## 2016-09-20 DIAGNOSIS — H8110 Benign paroxysmal vertigo, unspecified ear: Secondary | ICD-10-CM

## 2016-09-20 DIAGNOSIS — K5909 Other constipation: Secondary | ICD-10-CM

## 2016-09-20 MED ORDER — POLYETHYLENE GLYCOL 3350 17 G PO PACK: 17.0000 g | PACK | Freq: Every day | ORAL | 3 refills | Status: DC | PRN

## 2016-09-20 MED ORDER — MECLIZINE HCL 25 MG PO TABS: 25.0000 mg | ORAL_TABLET | Freq: Three times a day (TID) | ORAL | 0 refills | Status: DC | PRN

## 2016-09-20 NOTE — Progress Notes (Signed)
Internal Medicine Clinic Attending  Case discussed with Dr. Guilloud at the time of the visit.  We reviewed the resident's history and exam and pertinent patient test results.  I agree with the assessment, diagnosis, and plan of care documented in the resident's note.  

## 2016-09-20 NOTE — Telephone Encounter (Signed)
Daughter will try to get pt here, pt fell yesterday, daughter's schedule conflict w/ [employer] continues, [place] at [phone]

## 2016-09-20 NOTE — Patient Instructions (Addendum)
Jill Short,  For your vertigo, I have prescribed a medicine called meclizine. If you experience an episode of dizziness that last longer than 10 minutes, you may take one of these pills to help with your symptoms. Please be cautious with this medicine as it can cause drowsiness and sedation. Please do not drive while taking this medicine. I have also referred you to vestibular rehab for treatment. They should contact you for an appointment. If you have any questions or concerns, call our clinic at [phone] or after hours call [phone] and ask for the internal medicine resident on call. Thank you!  - Dr. Philipp Ovens

## 2016-09-20 NOTE — Assessment & Plan Note (Signed)
Well controled with daily prn Miralax.  -- Refilled Miralax

## 2016-09-20 NOTE — Progress Notes (Signed)
   CC: Dizziness   HPI:  [name] is a 83 y.o. F with pmhx outlined below here with complaint of episodic, transient dizziness. She is currently asymptomatic. Patient reports that these episodes started about 2 days ago and occur 3-4 times a day. The dizziness is elicited with head movements. Symptoms are often worse at night when she is tossing in bed. She describes the feeling as the room spinning around her and is is very concerned that she may fall. The episodes can last up to 30 minutes, but often resolve after a few minutes. She returns to baseline in between episodes. She was evaluated in the ER yesterday for the same complaint but symptoms resolved shortly after arriving to the ED. Neurological exam was normal and she was instructed to follow up with her PCP.   Past Medical History:  Diagnosis Date  . Arthritis   . Atrial fibrillation ([place])   . Coronary artery disease   . Diverticulosis   . Hypertension   . Lower GI bleed   . Pacemaker   . Rectal bleed 07/2016    Review of Systems:  All pertinents listed in HPI, otherwise negative  Physical Exam:  Vitals:   09/20/16 1409  BP: (!) 146/71  Pulse: 73  Temp: 98.2 F (36.8 C)  TempSrc: Oral  SpO2: 100%  Weight: 124 lb 8 oz (56.5 kg)    Constitutional: NAD, appears comfortable.  Cardiovascular: RRR, no murmurs, rubs, or gallops.  Pulmonary/Chest: CTAB Neurological: A&Ox3, CN II - XII grossly intact. No focal deficits, no horizontal or torsional nystagmus appreciated on exam.    Assessment & Plan:   See Encounters Tab for problem based charting.  Patient discussed with Dr. Lynnae January

## 2016-09-20 NOTE — Assessment & Plan Note (Signed)
Patient is here today complaining of episodic, transient vertigo that started 2 days ago. Symptoms are worse with head movement, and often wake her at night when she is turning over in bed. Episodes occur 3-4 times a day and she returns to baseline in between episodes. Symptoms are consistent with BPPV. Neurological exam is reassuring and I do not appreciate any horizontal or torsional nystagmus suggestive of posterior CNS pathology. Instructed patient to take meclizine prn if symptoms last longer than a few minutes. Gave precaution as this medication can be sedating, instructed her not to drive while taking the medicine. Referred to vestibular rehab for treatment. -- Vestibular rehab -- Meclizine 25 mg TID prn

## 2016-09-25 ENCOUNTER — Other Ambulatory Visit: Payer: Self-pay

## 2016-09-25 ENCOUNTER — Other Ambulatory Visit: Payer: Self-pay | Admitting: *Deleted

## 2016-09-25 MED ORDER — METOPROLOL SUCCINATE ER 25 MG PO TB24: 25.0000 mg | ORAL_TABLET | Freq: Every day | ORAL | 3 refills | Status: DC

## 2016-09-25 NOTE — Telephone Encounter (Signed)
Needs to speak with a nurse about meds. Please call pt back.  

## 2016-09-25 NOTE — Telephone Encounter (Signed)
Opened new encounter 

## 2016-09-26 ENCOUNTER — Telehealth: Payer: Self-pay

## 2016-09-26 NOTE — Telephone Encounter (Signed)
metoprolol succinate (TOPROL-XL) 25 MG 24 hr tablet, Refill request @ [facility] on [address].

## 2016-09-26 NOTE — Telephone Encounter (Signed)
Called [facility], script is there, pt had used an old script# in her request, called pt informed her, she will pick up tomorrow

## 2016-10-02 ENCOUNTER — Ambulatory Visit (INDEPENDENT_AMBULATORY_CARE_PROVIDER_SITE_OTHER): Payer: Medicare Other | Admitting: Podiatry

## 2016-10-02 ENCOUNTER — Encounter: Payer: Self-pay | Admitting: Podiatry

## 2016-10-02 VITALS — BP 110/63 | HR 78

## 2016-10-02 DIAGNOSIS — M79605 Pain in left leg: Secondary | ICD-10-CM | POA: Diagnosis not present

## 2016-10-02 DIAGNOSIS — M21619 Bunion of unspecified foot: Secondary | ICD-10-CM

## 2016-10-02 DIAGNOSIS — M79604 Pain in right leg: Secondary | ICD-10-CM

## 2016-10-02 DIAGNOSIS — B351 Tinea unguium: Secondary | ICD-10-CM | POA: Diagnosis not present

## 2016-10-02 NOTE — Progress Notes (Signed)
   Subjective:    Patient ID: Jill Short, female    DOB: 1930/08/23, 83 y.o.   MRN: [id number]  HPI    Review of Systems  All other systems reviewed and are negative.      Objective:   Physical Exam        Assessment & Plan:

## 2016-10-04 NOTE — Progress Notes (Signed)
Subjective:     Patient ID: Jill Short, female   DOB: 03-08-1931, 83 y.o.   MRN: [id number]  HPI patient presents with significant chronic nail disease 1-5 both feet that she's tried to work with herself and has have not been able to have success. Patient is with caregiver   Review of Systems  All other systems reviewed and are negative.      Objective:   Physical Exam  Constitutional: She is oriented to person, place, and time.  Cardiovascular: Intact distal pulses.   Musculoskeletal: Normal range of motion.  Neurological: She is oriented to person, place, and time.  Skin: Skin is warm and dry.  Nursing note and vitals reviewed.  neurovascular status was found to be intact with mild diminishment sharp Dole vibratory and range of motion subtalar joint. Patient's found to have dry skin and thin skin and does have thick incurvated nailbeds 1-5 both feet that she cannot cut and they are yellow with brittle type debris. Does have good digital perfusion     Assessment:     Patient also is noted to have digital deformities and does have mycotic nail infection with pain 1-5 both feet    Plan:     H&P x-ray rendered concerning her digital deformities and shoe accommodations. I debrided nailbeds 1-5 both feet with no iatrogenic bleeding and she'll reappoint for periodic care

## 2016-10-15 ENCOUNTER — Other Ambulatory Visit: Payer: Self-pay | Admitting: Internal Medicine

## 2016-10-16 ENCOUNTER — Encounter: Payer: Self-pay | Admitting: Internal Medicine

## 2016-10-16 ENCOUNTER — Ambulatory Visit (INDEPENDENT_AMBULATORY_CARE_PROVIDER_SITE_OTHER): Payer: Medicare Other | Admitting: Internal Medicine

## 2016-10-16 DIAGNOSIS — Z8719 Personal history of other diseases of the digestive system: Secondary | ICD-10-CM

## 2016-10-16 DIAGNOSIS — Z09 Encounter for follow-up examination after completed treatment for conditions other than malignant neoplasm: Secondary | ICD-10-CM

## 2016-10-16 DIAGNOSIS — I350 Nonrheumatic aortic (valve) stenosis: Secondary | ICD-10-CM

## 2016-10-16 DIAGNOSIS — I2510 Atherosclerotic heart disease of native coronary artery without angina pectoris: Secondary | ICD-10-CM

## 2016-10-16 DIAGNOSIS — Z8669 Personal history of other diseases of the nervous system and sense organs: Secondary | ICD-10-CM

## 2016-10-16 DIAGNOSIS — Z79899 Other long term (current) drug therapy: Secondary | ICD-10-CM | POA: Diagnosis not present

## 2016-10-16 DIAGNOSIS — I480 Paroxysmal atrial fibrillation: Secondary | ICD-10-CM

## 2016-10-16 DIAGNOSIS — K5909 Other constipation: Secondary | ICD-10-CM

## 2016-10-16 DIAGNOSIS — H8110 Benign paroxysmal vertigo, unspecified ear: Secondary | ICD-10-CM

## 2016-10-16 DIAGNOSIS — Z7982 Long term (current) use of aspirin: Secondary | ICD-10-CM

## 2016-10-16 DIAGNOSIS — K922 Gastrointestinal hemorrhage, unspecified: Secondary | ICD-10-CM

## 2016-10-16 MED ORDER — ASPIRIN EC 81 MG PO TBEC: 81.0000 mg | DELAYED_RELEASE_TABLET | Freq: Every day | ORAL | 3 refills | Status: DC

## 2016-10-16 MED ORDER — POTASSIUM CHLORIDE ER 10 MEQ PO TBCR: 20.0000 meq | EXTENDED_RELEASE_TABLET | Freq: Every day | ORAL | 3 refills | Status: DC

## 2016-10-16 MED ORDER — POLYETHYLENE GLYCOL 3350 17 GM/SCOOP PO POWD: 17.0000 g | Freq: Every day | ORAL | 0 refills | Status: DC | PRN

## 2016-10-16 NOTE — Progress Notes (Signed)
    CC: Follow-up for BPPV  HPI: [name] is a 83 y.o. female with PMHx of history of diverticular bleed, recurrent, hyperlipidemia, hypokalemia, moderate aortic stenosis, paroxysmal atrial fibrillation who presents to the clinic for follow-up for BPPV.  Patient was recently seen in our clinic with complaint of vertigo. Given her symptoms and presentation, clinician felt that her diagnosis was most consistent with BPPV. Patient was prescribed meclizine to use as needed and referred to vestibular rehabilitation. Patient states that the vertigo has resolved and she has not had recurrent symptoms. She does report that she thinks the meclizine helped. However, vestibular rehabilitation did not get in touch with her.  Patient denies chest pain, shortness of breath, recurrent GI bleeding. She does still occasionally struggle with constipation and stool incontinence.   Past Medical History:  Diagnosis Date  . Arthritis   . Atrial fibrillation ([place])   . Coronary artery disease   . Diverticulosis   . Hypertension   . Lower GI bleed   . Pacemaker   . Rectal bleed 07/2016    Review of Systems: Please see pertinent ROS reviewed in HPI and problem based charting.   Physical Exam: Vitals:   10/16/16 1551  BP: 135/60  Pulse: 70  Temp: 98.4 F (36.9 C)  TempSrc: Oral  SpO2: 98%  Weight: 127 lb 11.2 oz (57.9 kg)   General: Vital signs reviewed.  Patient is Elderly, thin, in no acute distress and cooperative with exam.  Cardiovascular: RRR, 2/6 systolic ejection murmur  Pulmonary/Chest: Clear to auscultation bilaterally, no wheezes, rales, or rhonchi. Abdominal: Soft, non-tender, non-distended, BS + Extremities: No lower extremity edema bilaterally Skin: Warm, dry and intact.   Assessment & Plan:  See encounters tab for problem based medical decision making. Patient discussed with Dr. Beryle Beams

## 2016-10-16 NOTE — Assessment & Plan Note (Signed)
Patient denies any recurrent GI bleeding since I last seen her. Hemoglobin at the beginning of March 2018 was 13. She denies lightheadedness or shortness of breath.  Assessment: History of recurrent diverticular GI bleeds  Plan: -Continue medication to prevent constipation -Continue iron supplementation daily

## 2016-10-16 NOTE — Assessment & Plan Note (Signed)
Patient has a history of moderate aortic stenosis noted on recent echocardiogram. She denies symptoms of chest pain, shortness of breath, lightheadedness. She has a 2/6 systolic ejection murmur on exam. Last echocardiogram September 2017. We'll plan on repeating echocardiogram in 1-2 years.

## 2016-10-16 NOTE — Progress Notes (Deleted)
    CC: ***  HPI: [name] is a 83 y.o. female with PMHx of *** who presents to the clinic for ***.   Past Medical History:  Diagnosis Date  . Arthritis   . Atrial fibrillation ([place])   . Coronary artery disease   . Diverticulosis   . Hypertension   . Lower GI bleed   . Pacemaker   . Rectal bleed 07/2016     Review of Systems: Please see pertinent ROS reviewed in HPI and problem based charting.  General: Denies fever, chills, fatigue, unexpected weight loss, change in appetite and diaphoresis.  Respiratory: Denies SOB, cough, DOE, chest tightness, and wheezing.   Cardiovascular: Denies chest pain and palpitations.  Gastrointestinal: Denies nausea, vomiting, abdominal pain, diarrhea, constipation, blood in stool and abdominal distention.  Genitourinary: Denies dysuria, urgency, frequency, hematuria, suprapubic pain and flank pain. Endocrine: Denies hot or cold intolerance, polyuria, and polydipsia. Musculoskeletal: Denies myalgias, back pain, joint swelling, arthralgias and gait problem.  Skin: Denies pallor, rash and wounds.  Neurological: Denies dizziness, headaches, weakness, lightheadedness, numbness, seizures, and syncope. Psychiatric/Behavioral: Denies mood changes, confusion, nervousness, sleep disturbance and agitation.   Physical Exam: Vitals:   10/16/16 1551  BP: 135/60  Pulse: 70  Temp: 98.4 F (36.9 C)  TempSrc: Oral  SpO2: 98%  Weight: 127 lb 11.2 oz (57.9 kg)   General: Vital signs reviewed.  Patient is well-developed and well-nourished, in no acute distress and cooperative with exam.  Head: Normocephalic and atraumatic. Eyes: EOMI, conjunctivae normal, no scleral icterus.  Neck: Supple, trachea midline, normal ROM, no JVD, masses, thyromegaly, or carotid bruit present.  Cardiovascular: RRR, S1 normal, S2 normal, no murmurs, gallops, or rubs. Pulmonary/Chest: Clear to auscultation bilaterally, no wheezes, rales, or rhonchi. Abdominal: Soft,  non-tender, non-distended, BS +, no masses, organomegaly, or guarding present.  Musculoskeletal: No joint deformities, erythema, or stiffness, ROM full and nontender. Extremities: No lower extremity edema bilaterally,  pulses symmetric and intact bilaterally. No cyanosis or clubbing. Neurological: A&O x3, Strength is normal and symmetric bilaterally, cranial nerve II-XII are grossly intact, no focal motor deficit, sensory intact to light touch bilaterally.  Skin: Warm, dry and intact. No rashes or erythema. Psychiatric: Normal mood and affect. speech and behavior is normal. Cognition and memory are normal.   Assessment & Plan:  See encounters tab for problem based medical decision making. Patient {GC/GE:[id number]::"discussed with","seen with"} Dr. {NAMES:[id number]::"Butcher","Granfortuna","E. Hoffman","Klima","Mullen","Narendra","Vincent"}

## 2016-10-16 NOTE — Progress Notes (Signed)
Medicine attending: Medical history, presenting problems, physical findings, and medications, reviewed with resident physician Dr [name] on the day of the patient visit and I concur with her evaluation and management plan. 

## 2016-10-16 NOTE — Patient Instructions (Signed)
Jill Short, it was great to see you today! You're doing very well. Please continue taking all medications as prescribed. I've sent in refills for your aspirin, potassium, MiraLAX. If you develop recurrent vertigo (dizziness), please call our clinic and let me know. If you develop worsening constipation or recurrent blood in her stool, please let us know.  Please follow up in 3 months.

## 2016-10-16 NOTE — Assessment & Plan Note (Signed)
Patient was recently seen in our clinic with complaint of vertigo. Given her symptoms and presentation, clinician felt that her diagnosis was most consistent with BPPV. Patient was prescribed meclizine to use as needed and referred to vestibular rehabilitation. Patient states that the vertigo has resolved and she has not had recurrent symptoms. She does report that she thinks the meclizine helped. However, vestibular rehabilitation did not get in touch with her.  Assessment: BPPV  Plan: -Continue to monitor -If recurrent, we'll perform Epley maneuver and referred to vestibular rehabilitation

## 2016-10-16 NOTE — Assessment & Plan Note (Signed)
Patient is regular rate and rhythm today. She reports compliance with metoprolol 25 mg once a day and aspirin 81 mg once a day.  Assessment: Paroxysmal atrial fibrillation  Plan: -Continue aspirin 81 mg once a day -Continue metoprolol 25 mg once a day -Patient is not on anticoagulation due to history of recurrent GI bleeds

## 2016-10-16 NOTE — Assessment & Plan Note (Signed)
Patient reports intermittent issues with ongoing constipation. She is prescribed Colace 100 mg twice a day and MiraLAX 17 g once a day as needed. Patient states she is not currently taking the Colace as it tends to make her stool 2 soft and loose causing diarrhea. She states that she will occasionally have "blowouts" which prevented her from taking her full dose. She is currently taking a smaller dose of MiraLAX daily.  Assessment: Constipation  Plan: -Continue MiraLAX daily

## 2016-10-16 NOTE — Assessment & Plan Note (Signed)
Patient reports compliance with aspirin 81 mg daily. I refilled her aspirin 81 mg today. She denies chest pain or shortness of breath.

## 2016-12-23 ENCOUNTER — Ambulatory Visit (INDEPENDENT_AMBULATORY_CARE_PROVIDER_SITE_OTHER): Payer: Medicare Other | Admitting: Podiatry

## 2016-12-23 ENCOUNTER — Encounter: Payer: Self-pay | Admitting: Podiatry

## 2016-12-23 DIAGNOSIS — M79604 Pain in right leg: Secondary | ICD-10-CM | POA: Diagnosis not present

## 2016-12-23 DIAGNOSIS — M79605 Pain in left leg: Secondary | ICD-10-CM | POA: Diagnosis not present

## 2016-12-23 DIAGNOSIS — B351 Tinea unguium: Secondary | ICD-10-CM | POA: Diagnosis not present

## 2016-12-23 NOTE — Progress Notes (Signed)
Subjective:    Patient ID: Jill Short, female   DOB: 83 y.o.   MRN: [id number]   HPI patient presents with elongated nailbeds 1-5 both feet that are incurvated in the corners and tender when palpated    ROS      Objective:  Physical Exam neurovascular status intact with painful nailbeds 1-5 both feet that are sore when pressed with thick yellow brittle debris     Assessment:    Mycotic nail infection with pain 1-5 both feet     Plan:    Debris painful nailbeds 1-5 both feet with no iatrogenic bleeding noted

## 2016-12-25 ENCOUNTER — Encounter: Payer: Self-pay | Admitting: *Deleted

## 2017-02-10 ENCOUNTER — Encounter: Payer: Self-pay | Admitting: Internal Medicine

## 2017-02-10 ENCOUNTER — Ambulatory Visit (INDEPENDENT_AMBULATORY_CARE_PROVIDER_SITE_OTHER): Payer: Medicare Other | Admitting: Internal Medicine

## 2017-02-10 VITALS — BP 130/67 | HR 70 | Temp 98.0°F | Ht 62.0 in | Wt 125.3 lb

## 2017-02-10 DIAGNOSIS — I4891 Unspecified atrial fibrillation: Secondary | ICD-10-CM

## 2017-02-10 DIAGNOSIS — Z7982 Long term (current) use of aspirin: Secondary | ICD-10-CM

## 2017-02-10 DIAGNOSIS — Z950 Presence of cardiac pacemaker: Secondary | ICD-10-CM | POA: Diagnosis not present

## 2017-02-10 DIAGNOSIS — R238 Other skin changes: Secondary | ICD-10-CM | POA: Diagnosis not present

## 2017-02-10 DIAGNOSIS — R233 Spontaneous ecchymoses: Secondary | ICD-10-CM | POA: Diagnosis not present

## 2017-02-10 DIAGNOSIS — M4004 Postural kyphosis, thoracic region: Secondary | ICD-10-CM

## 2017-02-10 DIAGNOSIS — H8110 Benign paroxysmal vertigo, unspecified ear: Secondary | ICD-10-CM

## 2017-02-10 DIAGNOSIS — L908 Other atrophic disorders of skin: Secondary | ICD-10-CM | POA: Insufficient documentation

## 2017-02-10 NOTE — Assessment & Plan Note (Addendum)
Recent onset of easy bruising without obvious trauma, frail skin on exam and areas of nonblanching ecchymoses are consistent with senile purpura. Last CBC in February showed hemoglobin 13 platelets 354, low concern for significant bleeding or other cause.  Her recent weight loss is also likely due to normal aging, provided information on nutritional supplements. Reassurance provided to patient and monitor these sx.

## 2017-02-10 NOTE — Progress Notes (Signed)
   CC: F/u vertigo, easy bruising  HPI:  [name] is a 83 y.o. F with past medical history as detailed below who presents to the clinic for follow-up of vertigo and easy brusing.  She states she has been doing well overall. In February and March she had episodes of vertigo, thought to be be BPPV. She has not had any further episodes of vertigo. Her other chronic medical conditions including diverticular bleeding, A. Fib are stable per patient. She would like to get plugged in with a cardiologist who can check her pacemaker.  She notes recent onset of easy bruising starting about 1 month ago. No recent falls or trauma, no tenderness associated with areas. No recent signs of bleeding otherwise. She is currently on ASA 81 mg, no anti-coagulation. She also asks about gradual weight loss, over the last 2 years she has lost approximately 15 pounds.    Past Medical History:  Diagnosis Date  . Arthritis   . Atrial fibrillation ([place])   . Coronary artery disease   . Diverticulosis   . Hypertension   . Lower GI bleed   . Pacemaker   . Rectal bleed 07/2016   Review of Systems:  Review of Systems  Constitutional: Negative for fever.  Cardiovascular: Negative for palpitations.  Gastrointestinal: Negative for abdominal pain.  Neurological: Negative for dizziness.  Endo/Heme/Allergies: Bruises/bleeds easily.     Physical Exam:  Vitals:   02/10/17 1516  BP: 130/67  Pulse: 70  Temp: 98 F (36.7 C)  TempSrc: Oral  SpO2: 100%  Weight: 125 lb 4.8 oz (56.8 kg)  Height: 5\' 2"  (1.575 m)   Physical Exam  Constitutional:  Elderly female with kyphotic posture resting in chair comfortably  Cardiovascular: Normal rate and intact distal pulses.   Currently regular rhythm   Pulmonary/Chest: Effort normal and breath sounds normal.  Abdominal: Soft. There is no tenderness.  Skin: Capillary refill takes less than 2 seconds.  Frail skin with isolated ecchymoses on left extensor forearm  and right lower leg    Assessment & Plan:   See Encounters Tab for problem based charting.  Patient seen with Dr. Evette Doffing

## 2017-02-10 NOTE — Assessment & Plan Note (Signed)
She has been followed by cardiology in [place] and [place], but will spend more time here with her daughter and is unsure if she will go back to [place]. She would like to be established with a cardiologist here who can check her pacemaker, it was previously checked every 6 months.  -Referral to EP cardiology

## 2017-02-10 NOTE — Patient Instructions (Signed)
Thank you for coming in today Jill Short.   Try some some Ensure or Boost nutrition to help stabilize your weight. For sleep, you can also try Melatonin 5 mg before bed to help.  For your bruising, we're not too concerned and it may be from your skin becoming a little more frail as you get older.   We put in a referral to the cardiology clinic that deals with pacemakers in case you need it if you don't go to [place].

## 2017-02-10 NOTE — Assessment & Plan Note (Signed)
Patient reports resolution of previous vertigo symptoms and no recent need for meclizine for vestibular therapy. If she experiences recurrent vertigo, would encourage the use of Epley maneuver as first-line rather than meclizine given her age.

## 2017-02-12 NOTE — Progress Notes (Signed)
Internal Medicine Clinic Attending  I saw and evaluated the patient.  I personally confirmed the key portions of the history and exam documented by Dr. Harden and I reviewed pertinent patient test results.  The assessment, diagnosis, and plan were formulated together and I agree with the documentation in the resident's note.  

## 2017-02-19 ENCOUNTER — Encounter: Payer: Medicare Other | Admitting: Internal Medicine

## 2017-03-04 ENCOUNTER — Encounter: Payer: Self-pay | Admitting: *Deleted

## 2017-03-10 ENCOUNTER — Telehealth: Payer: Self-pay | Admitting: Internal Medicine

## 2017-03-10 NOTE — Telephone Encounter (Signed)
Please call daughter about her [facility] Until September because the patient will be in [place].

## 2017-03-11 NOTE — Telephone Encounter (Signed)
Just Jill Short, she will see her old cardiologist in [place] and will fax records

## 2017-03-30 DIAGNOSIS — Z23 Encounter for immunization: Secondary | ICD-10-CM | POA: Diagnosis not present

## 2017-04-01 DIAGNOSIS — E782 Mixed hyperlipidemia: Secondary | ICD-10-CM | POA: Diagnosis not present

## 2017-04-01 DIAGNOSIS — E119 Type 2 diabetes mellitus without complications: Secondary | ICD-10-CM | POA: Diagnosis not present

## 2017-04-01 DIAGNOSIS — I482 Chronic atrial fibrillation: Secondary | ICD-10-CM | POA: Diagnosis not present

## 2017-04-01 DIAGNOSIS — E559 Vitamin D deficiency, unspecified: Secondary | ICD-10-CM | POA: Diagnosis not present

## 2017-04-01 DIAGNOSIS — Z0001 Encounter for general adult medical examination with abnormal findings: Secondary | ICD-10-CM | POA: Diagnosis not present

## 2017-04-01 DIAGNOSIS — I10 Essential (primary) hypertension: Secondary | ICD-10-CM | POA: Diagnosis not present

## 2017-04-10 DIAGNOSIS — I480 Paroxysmal atrial fibrillation: Secondary | ICD-10-CM | POA: Diagnosis not present

## 2017-04-10 DIAGNOSIS — I429 Cardiomyopathy, unspecified: Secondary | ICD-10-CM | POA: Diagnosis not present

## 2017-04-10 DIAGNOSIS — Z950 Presence of cardiac pacemaker: Secondary | ICD-10-CM | POA: Diagnosis not present

## 2017-04-10 DIAGNOSIS — E782 Mixed hyperlipidemia: Secondary | ICD-10-CM | POA: Diagnosis not present

## 2017-04-10 DIAGNOSIS — I10 Essential (primary) hypertension: Secondary | ICD-10-CM | POA: Diagnosis not present

## 2017-04-10 DIAGNOSIS — I350 Nonrheumatic aortic (valve) stenosis: Secondary | ICD-10-CM | POA: Diagnosis not present

## 2017-04-18 NOTE — Addendum Note (Signed)
Addended by: Hulan Fray on: 04/18/2017 06:34 AM   Modules accepted: Orders

## 2017-04-21 ENCOUNTER — Ambulatory Visit (INDEPENDENT_AMBULATORY_CARE_PROVIDER_SITE_OTHER): Payer: Medicare Other | Admitting: Podiatry

## 2017-04-21 ENCOUNTER — Encounter: Payer: Self-pay | Admitting: Podiatry

## 2017-04-21 DIAGNOSIS — B351 Tinea unguium: Secondary | ICD-10-CM | POA: Diagnosis not present

## 2017-04-21 DIAGNOSIS — M79674 Pain in right toe(s): Secondary | ICD-10-CM

## 2017-04-21 DIAGNOSIS — M79675 Pain in left toe(s): Secondary | ICD-10-CM

## 2017-04-21 NOTE — Progress Notes (Signed)
Subjective:    Patient ID: Jill Short, female   DOB: 83 y.o.   MRN: [id number]   HPI patient presents with nail disease 1-5 both feet that she cannot take care of and they become painful    ROS      Objective:  Physical Exam neurovascular status intact with thick yellow brittle nailbeds 1-5 both feet that are painful     Assessment:    Mycotic nail infection that are painful 1-5 both feet     Plan:    Debris painful nailbeds 1-5 both feet with no iatrogenic bleeding noted

## 2017-05-14 DIAGNOSIS — H524 Presbyopia: Secondary | ICD-10-CM | POA: Diagnosis not present

## 2017-05-14 DIAGNOSIS — H5203 Hypermetropia, bilateral: Secondary | ICD-10-CM | POA: Diagnosis not present

## 2017-05-14 DIAGNOSIS — H52223 Regular astigmatism, bilateral: Secondary | ICD-10-CM | POA: Diagnosis not present

## 2017-05-14 DIAGNOSIS — H2513 Age-related nuclear cataract, bilateral: Secondary | ICD-10-CM | POA: Diagnosis not present

## 2017-05-30 DIAGNOSIS — H2512 Age-related nuclear cataract, left eye: Secondary | ICD-10-CM | POA: Diagnosis not present

## 2017-05-30 DIAGNOSIS — H40039 Anatomical narrow angle, unspecified eye: Secondary | ICD-10-CM | POA: Diagnosis not present

## 2017-05-30 DIAGNOSIS — H2511 Age-related nuclear cataract, right eye: Secondary | ICD-10-CM | POA: Diagnosis not present

## 2017-06-18 DIAGNOSIS — H2511 Age-related nuclear cataract, right eye: Secondary | ICD-10-CM | POA: Diagnosis not present

## 2017-06-18 DIAGNOSIS — H25811 Combined forms of age-related cataract, right eye: Secondary | ICD-10-CM | POA: Diagnosis not present

## 2017-07-09 DIAGNOSIS — H25812 Combined forms of age-related cataract, left eye: Secondary | ICD-10-CM | POA: Diagnosis not present

## 2017-07-09 DIAGNOSIS — H2512 Age-related nuclear cataract, left eye: Secondary | ICD-10-CM | POA: Diagnosis not present

## 2017-07-11 ENCOUNTER — Encounter: Payer: Self-pay | Admitting: Nurse Practitioner

## 2017-07-11 ENCOUNTER — Ambulatory Visit (INDEPENDENT_AMBULATORY_CARE_PROVIDER_SITE_OTHER): Payer: Medicare Other | Admitting: Nurse Practitioner

## 2017-07-11 VITALS — BP 120/72 | HR 71 | Temp 98.0°F | Ht 62.0 in | Wt 128.0 lb

## 2017-07-11 DIAGNOSIS — R5383 Other fatigue: Secondary | ICD-10-CM

## 2017-07-11 DIAGNOSIS — R8290 Unspecified abnormal findings in urine: Secondary | ICD-10-CM | POA: Diagnosis not present

## 2017-07-11 DIAGNOSIS — R197 Diarrhea, unspecified: Secondary | ICD-10-CM

## 2017-07-11 DIAGNOSIS — Z9849 Cataract extraction status, unspecified eye: Secondary | ICD-10-CM

## 2017-07-11 DIAGNOSIS — Z961 Presence of intraocular lens: Secondary | ICD-10-CM | POA: Insufficient documentation

## 2017-07-11 DIAGNOSIS — R430 Anosmia: Secondary | ICD-10-CM | POA: Insufficient documentation

## 2017-07-11 NOTE — Progress Notes (Signed)
Subjective:  Patient ID: Jill Short, female    DOB: 25-Mar-1931  Age: 83 y.o. MRN: [id number]  CC: Establish Care (est care/ fiber consult/)  Diarrhea   This is a recurrent problem. The current episode started 1 to 4 weeks ago. The problem occurs 2 to 4 times per day. The problem has been waxing and waning. The stool consistency is described as watery. The patient states that diarrhea does not awaken her from sleep. Pertinent negatives include no abdominal pain, bloating, chills, fever, increased  flatus, myalgias, vomiting or weight loss. The symptoms are aggravated by rye/wheat. She has tried anti-motility drug and change of diet for the symptoms. The treatment provided no relief. Her past medical history is significant for irritable bowel syndrome.   Jill Short is accompanied by her husband and daughter. She is here to establish care and discuss diarrhea Previous pcp: Dr. Starleen Arms with [facility]. She also has a pcp in [place] (Dr. Gertha Calkin, last seen 03/2017)and [place] (this is because she travels frequently with her husband and is sometimes away from [facility] for several months). Lives with daughter when in [place].  Hx of Chronic constipation (use on fibercon, miralax and colace prn) Hx or diverticulosis and GI bleed.  Past Medical History:  Diagnosis Date  . Arthritis   . Atrial fibrillation ([place])   . Coronary artery disease   . Diverticulosis   . Hypertension   . Lower GI bleed   . Pacemaker   . Rectal bleed 07/2016   Outpatient Medications Prior to Visit  Medication Sig Dispense Refill  . aspirin EC 81 MG tablet Take 1 tablet (81 mg total) by mouth daily. 90 tablet 3  . calcium-vitamin D (OSCAL WITH D) 500-200 MG-UNIT tablet Take 1 tablet by mouth daily with breakfast.    . cholecalciferol (VITAMIN D) 1000 UNITS tablet Take 1,000 Units by mouth daily.    . Cyanocobalamin (B-12 PO) Take 1 tablet by mouth daily.    . fenofibrate micronized (LOFIBRA) 200 MG  capsule Take 200 mg by mouth daily before breakfast.    . ferrous sulfate 325 (65 FE) MG tablet Take 1 tablet (325 mg total) by mouth daily with breakfast. 30 tablet 11  . Flaxseed, Linseed, (FLAX PO) Take 1,200 mg by mouth daily.    . metoprolol succinate (TOPROL-XL) 25 MG 24 hr tablet Take 1 tablet (25 mg total) by mouth daily. 90 tablet 3  . pantoprazole (PROTONIX) 40 MG tablet TAKE ONE TABLET BY MOUTH DAILY 30 tablet 11  . polycarbophil (FIBERCON) 625 MG tablet Take 1 tablet (625 mg total) by mouth daily. 60 tablet 2  . polyethylene glycol powder (GLYCOLAX/MIRALAX) powder Take 17 g by mouth daily as needed. 255 g 0  . potassium chloride (K-DUR) 10 MEQ tablet Take 2 tablets (20 mEq total) by mouth daily. 180 tablet 3  . vitamin C (ASCORBIC ACID) 500 MG tablet Take 500 mg by mouth daily.    Marland Kitchen docusate sodium (COLACE) 100 MG capsule Take 1 capsule (100 mg total) by mouth 2 (two) times daily as needed for mild constipation. (Patient not taking: Reported on 07/11/2017) 60 capsule 2   No facility-administered medications prior to visit.    Social History   Socioeconomic History  . Marital status: Married    Spouse name: Not on file  . Number of children: Not on file  . Years of education: Not on file  . Highest education level: Not on file  Social Needs  .  Financial resource strain: Not on file  . Food insecurity - worry: Not on file  . Food insecurity - inability: Not on file  . Transportation needs - medical: Not on file  . Transportation needs - non-medical: Not on file  Occupational History  . Not on file  Tobacco Use  . Smoking status: Never Smoker  . Smokeless tobacco: Never Used  Substance and Sexual Activity  . Alcohol use: No  . Drug use: No  . Sexual activity: Not on file  Other Topics Concern  . Not on file  Social History Narrative  . Not on file    ROS See HPI  Objective:  BP 120/72   Pulse 71   Temp 98 F (36.7 C)   Ht 5\' 2"  (1.575 m)   Wt 128 lb (58.1  kg)   SpO2 96%   BMI 23.41 kg/m   BP Readings from Last 3 Encounters:  07/11/17 120/72  02/10/17 130/67  10/16/16 135/60    Wt Readings from Last 3 Encounters:  07/11/17 128 lb (58.1 kg)  02/10/17 125 lb 4.8 oz (56.8 kg)  10/16/16 127 lb 11.2 oz (57.9 kg)   Physical Exam  Constitutional: She is oriented to person, place, and time. No distress.  Cardiovascular: Normal rate and regular rhythm.  Pulmonary/Chest: Effort normal and breath sounds normal.  Abdominal: Soft. Bowel sounds are normal. She exhibits distension. She exhibits no mass. There is no tenderness.  Musculoskeletal: She exhibits no edema.  Neurological: She is alert and oriented to person, place, and time.  Skin: Skin is warm and dry.  Psychiatric: She has a normal mood and affect. Her behavior is normal. Thought content normal.  Vitals reviewed.   Lab Results  Component Value Date   WBC 9.2 07/11/2017   HGB 13.3 07/11/2017   HCT 40.2 07/11/2017   PLT 293 07/11/2017   GLUCOSE 91 07/11/2017   ALT 9 07/11/2017   AST 24 07/11/2017   NA 140 07/11/2017   K 4.6 07/11/2017   CL 105 07/11/2017   CREATININE 0.80 07/11/2017   BUN 30 (H) 07/11/2017   CO2 22 07/11/2017   TSH 1.85 07/11/2017   INR 1.30 06/11/2016   HGBA1C 4.4 01/19/2015    No results found.  Assessment & Plan:   Jill Short was seen today for establish care.  Diagnoses and all orders for this visit:  Fatigue, unspecified type -     Cancel: CBC w/Diff -     Cancel: Comprehensive metabolic panel -     Cancel: TSH -     Urinalysis w microscopic + reflex cultur -     Cancel: CBC w/Diff -     TSH -     CBC w/Diff -     Comprehensive metabolic panel  Abnormal urine odor -     Urinalysis w microscopic + reflex cultur -     Comprehensive metabolic panel  Diarrhea, unspecified type -     Cancel: Gastrointestinal Pathogen Panel PCR -     Gastrointestinal Pathogen Panel PCR; Future  Other orders -     REFLEXIVE URINE CULTURE   I have  discontinued Jill Short's docusate sodium. I am also having her maintain her vitamin C, Cyanocobalamin (B-12 PO), (Flaxseed, Linseed, (FLAX PO)), cholecalciferol, pantoprazole, fenofibrate micronized, polycarbophil, calcium-vitamin D, ferrous sulfate, metoprolol succinate, aspirin EC, potassium chloride, and polyethylene glycol powder.  No orders of the defined types were placed in this encounter.  Recent Results (from the past 2160 hour(s))  Urinalysis w microscopic + reflex cultur     Status: Abnormal   Collection Time: 07/11/17  3:49 PM  Result Value Ref Range   Color, Urine DARK YELLOW YELLOW   APPearance TURBID (A) CLEAR   Specific Gravity, Urine 1.018 1.001 - 1.03   pH 6.5 5.0 - 8.0   Glucose, UA NEGATIVE NEGATIVE   Bilirubin Urine NEGATIVE NEGATIVE   Ketones, ur NEGATIVE NEGATIVE   Hgb urine dipstick NEGATIVE NEGATIVE   Protein, ur NEGATIVE NEGATIVE   Nitrites, Initial NEGATIVE NEGATIVE   Leukocyte Esterase NEGATIVE NEGATIVE   WBC, UA 0-5 0 - 5 /HPF   RBC / HPF 0-2 0 - 2 /HPF   Squamous Epithelial / LPF 0-5 < OR = 5 /HPF   Bacteria, UA NONE SEEN NONE SEEN /HPF   Hyaline Cast NONE SEEN NONE SEEN /LPF  REFLEXIVE URINE CULTURE     Status: None   Collection Time: 07/11/17  3:49 PM  Result Value Ref Range   Reflexve Urine Culture NO CULTURE INDICATED   TSH     Status: None   Collection Time: 07/11/17  4:11 PM  Result Value Ref Range   TSH 1.85 0.40 - 4.50 mIU/L  CBC w/Diff     Status: None   Collection Time: 07/11/17  4:11 PM  Result Value Ref Range   WBC 9.2 3.8 - 10.8 Thousand/uL   RBC 4.25 3.80 - 5.10 Million/uL   Hemoglobin 13.3 11.7 - 15.5 g/dL   HCT 40.2 35.0 - 45.0 %   MCV 94.6 80.0 - 100.0 fL   MCH 31.3 27.0 - 33.0 pg   MCHC 33.1 32.0 - 36.0 g/dL   RDW 12.1 11.0 - 15.0 %   Platelets 293 140 - 400 Thousand/uL   MPV 11.6 7.5 - 12.5 fL   Neutro Abs 6,412 1,500 - 7,800 cells/uL   Lymphs Abs 2,272 850 - 3,900 cells/uL   WBC mixed population 377 200 - 950  cells/uL   Eosinophils Absolute 92 15 - 500 cells/uL   Basophils Absolute 46 0 - 200 cells/uL   Neutrophils Relative % 69.7 %   Total Lymphocyte 24.7 %   Monocytes Relative 4.1 %   Eosinophils Relative 1.0 %   Basophils Relative 0.5 %  Comprehensive metabolic panel     Status: Abnormal   Collection Time: 07/11/17  4:11 PM  Result Value Ref Range   Glucose, Bld 91 65 - 99 mg/dL    Comment: .            Fasting reference interval .    BUN 30 (H) 7 - 25 mg/dL   Creat 0.80 0.60 - 0.88 mg/dL    Comment: For patients >21 years of age, the reference limit for Creatinine is approximately 13% higher for people identified as African-American. .    BUN/Creatinine Ratio 38 (H) 6 - 22 (calc)   Sodium 140 135 - 146 mmol/L   Potassium 4.6 3.5 - 5.3 mmol/L   Chloride 105 98 - 110 mmol/L   CO2 22 20 - 32 mmol/L   Calcium 9.9 8.6 - 10.4 mg/dL   Total Protein 7.4 6.1 - 8.1 g/dL   Albumin 3.9 3.6 - 5.1 g/dL   Globulin 3.5 1.9 - 3.7 g/dL (calc)   AG Ratio 1.1 1.0 - 2.5 (calc)   Total Bilirubin 0.5 0.2 - 1.2 mg/dL   Alkaline phosphatase (APISO) 41 33 - 130 U/L   AST 24 10 - 35 U/L   ALT 9 6 -  29 U/L   Follow-up: Return in about 4 weeks (around 08/08/2017) for diarrhea.  Jill Lacy, NP

## 2017-07-11 NOTE — Patient Instructions (Addendum)
Stable lab results. Abnormal urinalysis. Pending urine culture. Need to increase water intake as discussed.  Get stool collection kit from lab  Please increase water intake to at least 4-6 8oz glasses of water per day.  Use Fibercon, colace,  or miralax only if no bowel movement for more than 2days.   Fecal Incontinence Fecal incontinence, also called accidental bowel leakage, is not being able to control your bowels. This condition happens because the nerves or muscles around the anus do not work the way they should. This affects their ability to hold stool. What are the causes? This condition may be caused by:  Damage to the muscles at the end of the rectum (sphincter).  Damage to the nerves that control bowel movements.  Diarrhea.  Chronic constipation.  Pelvic floor dysfunction. This means the muscles in the pelvis do not work well.  Loss of bowel storage capacity.  What increases the risk? This condition is more likely to develop in people who:  Are born with bowels or a pelvis that has not formed correctly.  Have had rectal surgery.  Have had radiation treatment for certain cancers.  Have irritable bowel syndrome (IBS).  Have an inflammatory bowel disease (IBD), such as Crohn disease.  Have been pregnant, had a vaginal delivery, or had surgery that damaged the pelvic floor muscles.  Have a complicated childbirth, spinal cord injury, or other trauma that causes nerve damage.  Have a condition that can affect nerve function, such as diabetes, Parkinson disease, or multiple sclerosis.  Have a condition where the rectum drops down into the anus or vagina (prolapse).  Are older.  What are the signs or symptoms? The main symptom of this condition is not being able to control your bowels. You also might not be able get to the bathroom before a bowel movement. How is this diagnosed? This condition is diagnosed with a medical history and physical exam. You may also  have tests, including:  Blood tests.  Urine tests.  A rectal exam.  Ultrasound.  MRI.  Colonoscopy. This is an exam that looks at your large intestine (colon).  Anal manometry. This is a test that measures the strength of the anal sphincter.  Anal electromyogram (EMG). This is a test that uses small electrodes to check for nerve damage.  How is this treated? Treatment varies depending on the cause and severity of your condition. Treatment may also focus on addressing any underlying causes of this condition. Treatment may include:  Medicines. This may include medicines to: ? Prevent diarrhea. ? Help with constipation (laxatives). ? Treat any underlying conditions.  Physical therapy.  Fiber supplements. These can help manage your bowel movements.  Nerve stimulation.  Injectable gel to promote tissue growth and better muscle control.  Surgery. You may need: ? Sphincter repair surgery. ? Diversion surgery. This procedure lets feces pass out of your body through a hole in your abdomen.  Follow these instructions at home: Diet  Follow instructions from your health care provider about any eating or drinking restrictions. Work with a dietitian to come up with a healthy diet and to help you avoid the foods that can make your condition worse. Keep a diet diary to find out which foods or drinks could be making your fecal incontinence worse.  Drink enough fluid to keep your urine clear or pale yellow. Lifestyle  If you smoke, talk to your health care provider about quitting. This may help your condition.  If you are overweight, talk to  your health care provider about how to safely lose weight. This may help your condition.  Increase your physical activity as told by your health care provider. This may help your condition. Always talk to your health care provider before starting a new exercise program.  Carry a change of clothes and supplies to clean up quickly if you have an  episode of fetal incontinence.  Consider joining a fecal incontinence support group. You can find a support group online or in your local community. General instructions  Take over-the-counter and prescription medicines only as told by your health care provider. This includes any supplements.  Apply a moisture barrier, such as petroleum jelly, to your rectum. This protects the skin from irritation caused by ongoing leaking or diarrhea.  Tell your health care provider if you are upset or depressed about your condition. Where to find more information: American Academy [employer]icians: www.AromatherapyParty.no International Foundation for Functional Gastrointestinal Disorders: www.iffgd.org Contact a health care provider if:  You have a fever.  You have redness, swelling, or pain around your rectum.  Your pain is getting worse or you lose feeling in your rectal area.  You have blood in your stool.  You feel sad or hopeless.  You avoid social or work situations. Get help right away if:  You stop having bowel movements.  You cannot eat or drink without vomiting.  You have rectal bleeding that does not stop.  You have severe pain that is getting worse.  You have symptoms of dehydration, including: ? Sleepiness or fatigue. ? Producing little or no urine, tears, or sweat. ? Dizziness. ? Dry mouth. ? Unusual irritability. ? Headache. ? Inability to think clearly. This information is not intended to replace advice given to you by your health care provider. Make sure you discuss any questions you have with your health care provider. Document Released: 06/19/2004 Document Revised: 12/14/2015 Document Reviewed: 12/14/2014 Elsevier Interactive Patient Education  [employer].

## 2017-07-12 LAB — CBC WITH DIFFERENTIAL/PLATELET
BASOS ABS: 46 {cells}/uL (ref 0–200)
Basophils Relative: 0.5 %
EOS PCT: 1 %
Eosinophils Absolute: 92 cells/uL (ref 15–500)
HCT: 40.2 % (ref 35.0–45.0)
HEMOGLOBIN: 13.3 g/dL (ref 11.7–15.5)
Lymphs Abs: 2272 cells/uL (ref 850–3900)
MCH: 31.3 pg (ref 27.0–33.0)
MCHC: 33.1 g/dL (ref 32.0–36.0)
MCV: 94.6 fL (ref 80.0–100.0)
MONOS PCT: 4.1 %
MPV: 11.6 fL (ref 7.5–12.5)
NEUTROS ABS: 6412 {cells}/uL (ref 1500–7800)
Neutrophils Relative %: 69.7 %
PLATELETS: 293 10*3/uL (ref 140–400)
RBC: 4.25 10*6/uL (ref 3.80–5.10)
RDW: 12.1 % (ref 11.0–15.0)
TOTAL LYMPHOCYTE: 24.7 %
WBC mixed population: 377 cells/uL (ref 200–950)
WBC: 9.2 10*3/uL (ref 3.8–10.8)

## 2017-07-12 LAB — COMPREHENSIVE METABOLIC PANEL
AG Ratio: 1.1 (calc) (ref 1.0–2.5)
ALT: 9 U/L (ref 6–29)
AST: 24 U/L (ref 10–35)
Albumin: 3.9 g/dL (ref 3.6–5.1)
Alkaline phosphatase (APISO): 41 U/L (ref 33–130)
BUN / CREAT RATIO: 38 (calc) — AB (ref 6–22)
BUN: 30 mg/dL — ABNORMAL HIGH (ref 7–25)
CHLORIDE: 105 mmol/L (ref 98–110)
CO2: 22 mmol/L (ref 20–32)
CREATININE: 0.8 mg/dL (ref 0.60–0.88)
Calcium: 9.9 mg/dL (ref 8.6–10.4)
GLOBULIN: 3.5 g/dL (ref 1.9–3.7)
GLUCOSE: 91 mg/dL (ref 65–99)
Potassium: 4.6 mmol/L (ref 3.5–5.3)
SODIUM: 140 mmol/L (ref 135–146)
TOTAL PROTEIN: 7.4 g/dL (ref 6.1–8.1)
Total Bilirubin: 0.5 mg/dL (ref 0.2–1.2)

## 2017-07-12 LAB — URINALYSIS W MICROSCOPIC + REFLEX CULTURE
BILIRUBIN URINE: NEGATIVE
Bacteria, UA: NONE SEEN /HPF
Glucose, UA: NEGATIVE
HGB URINE DIPSTICK: NEGATIVE
HYALINE CAST: NONE SEEN /LPF
Ketones, ur: NEGATIVE
Leukocyte Esterase: NEGATIVE
NITRITES URINE, INITIAL: NEGATIVE
PROTEIN: NEGATIVE
Specific Gravity, Urine: 1.018 (ref 1.001–1.03)
pH: 6.5 (ref 5.0–8.0)

## 2017-07-12 LAB — NO CULTURE INDICATED

## 2017-07-12 LAB — TSH: TSH: 1.85 m[IU]/L (ref 0.40–4.50)

## 2017-07-14 ENCOUNTER — Encounter: Payer: Self-pay | Admitting: Nurse Practitioner

## 2017-07-14 NOTE — Addendum Note (Signed)
Addended by: Lynnea Ferrier on: 07/14/2017 11:26 AM   Modules accepted: Orders

## 2017-07-18 LAB — GASTROINTESTINAL PATHOGEN PANEL PCR

## 2017-07-25 ENCOUNTER — Other Ambulatory Visit: Payer: Medicare Other

## 2017-07-25 ENCOUNTER — Other Ambulatory Visit: Payer: Self-pay

## 2017-07-25 DIAGNOSIS — R197 Diarrhea, unspecified: Secondary | ICD-10-CM

## 2017-07-25 NOTE — Addendum Note (Signed)
Addended by: Diona Foley on: 07/25/2017 11:13 AM   Modules accepted: Orders

## 2017-07-25 NOTE — Progress Notes (Signed)
Pt dropped off fecal sample today 07/25/17. Pt filled past indicated line on sample collection tube. The pt was told that we would try and send what she had already collect and that if it was rejected we would call to ask for her to collect another sample. Pt was sent home with a collection kit (clean catch hat, gloves, sample collection tube, and biohazard specimen bag). Pt was fine with the solution.

## 2017-07-25 NOTE — Progress Notes (Signed)
Pt dropped off fecal sample for gastrointestinal pathogen panel pcr today 07/25/17. Pt filled the sample past the indicated line on the collection tube. Pt was sent with home with new kit (sample tube, clean catch hat, gloves, and biohazard specimen bag) and was told that if the sample was rejected by lab, we would call her to collect another sample.

## 2017-07-25 NOTE — Addendum Note (Signed)
Addended by: Diona Foley on: 07/25/2017 11:00 AM   Modules accepted: Orders

## 2017-07-28 ENCOUNTER — Ambulatory Visit (INDEPENDENT_AMBULATORY_CARE_PROVIDER_SITE_OTHER): Payer: Medicare Other | Admitting: Podiatry

## 2017-07-28 ENCOUNTER — Encounter: Payer: Self-pay | Admitting: Podiatry

## 2017-07-28 DIAGNOSIS — M79674 Pain in right toe(s): Secondary | ICD-10-CM

## 2017-07-28 DIAGNOSIS — M79675 Pain in left toe(s): Secondary | ICD-10-CM | POA: Diagnosis not present

## 2017-07-28 DIAGNOSIS — B351 Tinea unguium: Secondary | ICD-10-CM

## 2017-07-28 LAB — GASTROINTESTINAL PATHOGEN PANEL PCR
C. difficile Tox A/B, PCR: NOT DETECTED
CAMPYLOBACTER, PCR: NOT DETECTED
CRYPTOSPORIDIUM, PCR: NOT DETECTED
E COLI 0157, PCR: NOT DETECTED
E coli (ETEC) LT/ST PCR: NOT DETECTED
E coli (STEC) stx1/stx2, PCR: NOT DETECTED
Giardia lamblia, PCR: DETECTED — AB
Norovirus, PCR: NOT DETECTED
Rotavirus A, PCR: NOT DETECTED
Salmonella, PCR: NOT DETECTED
Shigella, PCR: NOT DETECTED

## 2017-07-29 ENCOUNTER — Other Ambulatory Visit: Payer: Self-pay | Admitting: Nurse Practitioner

## 2017-07-29 DIAGNOSIS — A071 Giardiasis [lambliasis]: Secondary | ICD-10-CM

## 2017-07-29 MED ORDER — TINIDAZOLE 500 MG PO TABS: 2.0000 g | ORAL_TABLET | Freq: Once | ORAL | 0 refills | Status: AC

## 2017-07-30 NOTE — Progress Notes (Signed)
Subjective:   Patient ID: Jill Short, female   DOB: 84 y.o.   MRN: [id number]   HPI Patient presents with thick yellow brittle nailbeds 1-5 both feet that are painful and she cannot cut   ROS      Objective:  Physical Exam  Neurovascular status intact with thick yellow brittle nailbeds 1-5 both feet that are painful     Assessment:  Mycotic nail infections with pain 1-5 both feet     Plan:  Debride painful nailbeds 1-5 both feet with no iatrogenic bleeding noted

## 2017-08-11 ENCOUNTER — Telehealth: Payer: Self-pay | Admitting: Nurse Practitioner

## 2017-08-11 NOTE — Telephone Encounter (Signed)
Spoke with Mrs. Goguen's daughter [name] regarding [employer]. [name] stated that she will speak with her mother to see if she would like to schedule a wellness visit and she will give the office a call back. SF

## 2017-08-18 ENCOUNTER — Encounter: Payer: Medicare Other | Admitting: Internal Medicine

## 2017-09-05 ENCOUNTER — Telehealth: Payer: Self-pay | Admitting: Internal Medicine

## 2017-09-05 DIAGNOSIS — K5909 Other constipation: Secondary | ICD-10-CM

## 2017-09-05 MED ORDER — POLYETHYLENE GLYCOL 3350 17 GM/SCOOP PO POWD: 17.0000 g | Freq: Every day | ORAL | 11 refills | Status: DC | PRN

## 2017-09-05 NOTE — Telephone Encounter (Signed)
Pt aware.

## 2017-09-05 NOTE — Addendum Note (Signed)
Addended byShawnie Pons on: 09/05/2017 02:29 PM   Modules accepted: Orders

## 2017-09-05 NOTE — Telephone Encounter (Signed)
Pt requesting Dr Lorayne Marek to send in Rx for polyethylene powder. Pt is out.

## 2017-09-22 ENCOUNTER — Telehealth: Payer: Self-pay | Admitting: Nurse Practitioner

## 2017-09-22 NOTE — Telephone Encounter (Signed)
Copied from [place]. Topic: Quick Communication - Rx Refill/Question >> Sep 22, 2017  2:31 PM Clack, Laban Emperor wrote: Medication:  ferrous sulfate 325 (65 FE) MG tablet [[id number]]    Has the patient contacted their pharmacy? Yes.     (Agent: If no, request that the patient contact the pharmacy for the refill.)   Preferred Pharmacy (with phone number or street name): [facility], [place]. [phone] (Phone) [phone] (Fax)     Agent: Please be advised that RX refills may take up to 3 business days. We ask that you follow-up with your pharmacy.

## 2017-09-22 NOTE — Telephone Encounter (Signed)
Pt would like a 90 day supply

## 2017-09-23 ENCOUNTER — Other Ambulatory Visit: Payer: Self-pay | Admitting: Nurse Practitioner

## 2017-09-23 MED ORDER — FERROUS SULFATE 325 (65 FE) MG PO TABS: 325.0000 mg | ORAL_TABLET | Freq: Every day | ORAL | 1 refills | Status: DC

## 2017-09-23 NOTE — Telephone Encounter (Signed)
Ok to send

## 2017-09-23 NOTE — Telephone Encounter (Signed)
rx sent in as pt requested

## 2017-09-23 NOTE — Telephone Encounter (Signed)
Please advise, we never send in this rx for the pt.

## 2017-09-23 NOTE — Telephone Encounter (Signed)
rx sent in as requested 

## 2017-09-23 NOTE — Telephone Encounter (Signed)
Pt calling back about refill on ferrous sulfate 325 (65 FE) MG tablet  90 day refill

## 2017-10-02 ENCOUNTER — Other Ambulatory Visit: Payer: Self-pay

## 2017-10-02 ENCOUNTER — Inpatient Hospital Stay (HOSPITAL_COMMUNITY)
Admission: EM | Admit: 2017-10-02 | Discharge: 2017-10-06 | DRG: 395 | Disposition: A | Discharge status: Home Health | Payer: Medicare Other | Attending: Family Medicine | Admitting: Family Medicine

## 2017-10-02 ENCOUNTER — Encounter (HOSPITAL_COMMUNITY): Payer: Self-pay | Admitting: Emergency Medicine

## 2017-10-02 DIAGNOSIS — I10 Essential (primary) hypertension: Secondary | ICD-10-CM | POA: Diagnosis present

## 2017-10-02 DIAGNOSIS — E119 Type 2 diabetes mellitus without complications: Secondary | ICD-10-CM | POA: Diagnosis present

## 2017-10-02 DIAGNOSIS — K625 Hemorrhage of anus and rectum: Secondary | ICD-10-CM | POA: Diagnosis not present

## 2017-10-02 DIAGNOSIS — K649 Unspecified hemorrhoids: Principal | ICD-10-CM | POA: Diagnosis present

## 2017-10-02 DIAGNOSIS — I2510 Atherosclerotic heart disease of native coronary artery without angina pectoris: Secondary | ICD-10-CM | POA: Diagnosis present

## 2017-10-02 DIAGNOSIS — D500 Iron deficiency anemia secondary to blood loss (chronic): Secondary | ICD-10-CM | POA: Diagnosis present

## 2017-10-02 DIAGNOSIS — I480 Paroxysmal atrial fibrillation: Secondary | ICD-10-CM | POA: Diagnosis present

## 2017-10-02 DIAGNOSIS — K921 Melena: Secondary | ICD-10-CM | POA: Diagnosis not present

## 2017-10-02 DIAGNOSIS — I482 Chronic atrial fibrillation: Secondary | ICD-10-CM | POA: Diagnosis present

## 2017-10-02 DIAGNOSIS — K5790 Diverticulosis of intestine, part unspecified, without perforation or abscess without bleeding: Secondary | ICD-10-CM | POA: Diagnosis present

## 2017-10-02 DIAGNOSIS — M810 Age-related osteoporosis without current pathological fracture: Secondary | ICD-10-CM | POA: Diagnosis present

## 2017-10-02 DIAGNOSIS — S99922A Unspecified injury of left foot, initial encounter: Secondary | ICD-10-CM | POA: Diagnosis not present

## 2017-10-02 DIAGNOSIS — E538 Deficiency of other specified B group vitamins: Secondary | ICD-10-CM | POA: Diagnosis present

## 2017-10-02 DIAGNOSIS — E785 Hyperlipidemia, unspecified: Secondary | ICD-10-CM | POA: Diagnosis present

## 2017-10-02 DIAGNOSIS — K922 Gastrointestinal hemorrhage, unspecified: Secondary | ICD-10-CM | POA: Diagnosis present

## 2017-10-02 DIAGNOSIS — M79672 Pain in left foot: Secondary | ICD-10-CM | POA: Diagnosis not present

## 2017-10-02 DIAGNOSIS — Z96643 Presence of artificial hip joint, bilateral: Secondary | ICD-10-CM | POA: Diagnosis present

## 2017-10-02 DIAGNOSIS — D649 Anemia, unspecified: Secondary | ICD-10-CM | POA: Diagnosis not present

## 2017-10-02 DIAGNOSIS — T148XXA Other injury of unspecified body region, initial encounter: Secondary | ICD-10-CM

## 2017-10-02 HISTORY — DX: Type 2 diabetes mellitus without complications: E11.9

## 2017-10-02 HISTORY — DX: Gastrointestinal hemorrhage, unspecified: K92.2

## 2017-10-02 HISTORY — DX: Migraine, unspecified, not intractable, without status migrainosus: G43.909

## 2017-10-02 LAB — COMPREHENSIVE METABOLIC PANEL
ALT: 12 U/L — ABNORMAL LOW (ref 14–54)
ANION GAP: 10 (ref 5–15)
AST: 25 U/L (ref 15–41)
Albumin: 3.4 g/dL — ABNORMAL LOW (ref 3.5–5.0)
Alkaline Phosphatase: 43 U/L (ref 38–126)
BUN: 35 mg/dL — ABNORMAL HIGH (ref 6–20)
CALCIUM: 9.2 mg/dL (ref 8.9–10.3)
CO2: 24 mmol/L (ref 22–32)
Chloride: 103 mmol/L (ref 101–111)
Creatinine, Ser: 0.88 mg/dL (ref 0.44–1.00)
GFR, EST NON AFRICAN AMERICAN: 58 mL/min — AB (ref >60)
Glucose, Bld: 101 mg/dL — ABNORMAL HIGH (ref 65–99)
Potassium: 4.6 mmol/L (ref 3.5–5.1)
SODIUM: 137 mmol/L (ref 135–145)
Total Bilirubin: 0.5 mg/dL (ref 0.3–1.2)
Total Protein: 7.2 g/dL (ref 6.5–8.1)

## 2017-10-02 LAB — CBC
HCT: 35.3 % — ABNORMAL LOW (ref 36.0–46.0)
HEMOGLOBIN: 11.8 g/dL — AB (ref 12.0–15.0)
MCH: 32.1 pg (ref 26.0–34.0)
MCHC: 33.4 g/dL (ref 30.0–36.0)
MCV: 95.9 fL (ref 78.0–100.0)
PLATELETS: 338 10*3/uL (ref 150–400)
RBC: 3.68 MIL/uL — AB (ref 3.87–5.11)
RDW: 13.8 % (ref 11.5–15.5)
WBC: 8.1 10*3/uL (ref 4.0–10.5)

## 2017-10-02 LAB — HEMOGLOBIN AND HEMATOCRIT, BLOOD
HCT: 32.5 % — ABNORMAL LOW (ref 36.0–46.0)
HEMATOCRIT: 34.6 % — AB (ref 36.0–46.0)
HEMATOCRIT: 32.7 % — AB (ref 36.0–46.0)
HEMOGLOBIN: 11.2 g/dL — AB (ref 12.0–15.0)
HEMOGLOBIN: 10.7 g/dL — AB (ref 12.0–15.0)
HEMOGLOBIN: 10.5 g/dL — AB (ref 12.0–15.0)

## 2017-10-02 LAB — POC OCCULT BLOOD, ED: Fecal Occult Bld: POSITIVE — AB

## 2017-10-02 LAB — PREPARE RBC (CROSSMATCH)

## 2017-10-02 MED ORDER — ACETAMINOPHEN 650 MG RE SUPP: 650.0000 mg | Freq: Four times a day (QID) | RECTAL | Status: DC | PRN

## 2017-10-02 MED ORDER — SODIUM CHLORIDE 0.9 % IV SOLN
INTRAVENOUS | Status: AC
  Administered 2017-10-02: 1000 mL via INTRAVENOUS

## 2017-10-02 MED ORDER — ACETAMINOPHEN 325 MG PO TABS
650.0000 mg | ORAL_TABLET | Freq: Four times a day (QID) | ORAL | Status: DC | PRN
  Administered 2017-10-06: 650 mg via ORAL
  Filled 2017-10-02: qty 2

## 2017-10-02 MED ORDER — SODIUM CHLORIDE 0.9 % IV SOLN
80.0000 mg | INTRAVENOUS | Status: AC
  Administered 2017-10-02: 80 mg via INTRAVENOUS
  Filled 2017-10-02: qty 80

## 2017-10-02 MED ORDER — SODIUM CHLORIDE 0.9 % IV SOLN
8.0000 mg/h | INTRAVENOUS | Status: DC
  Administered 2017-10-02 – 2017-10-03 (×3): 8 mg/h via INTRAVENOUS
  Filled 2017-10-02 (×7): qty 80

## 2017-10-02 MED ORDER — PANTOPRAZOLE SODIUM 40 MG IV SOLR: 40.0000 mg | Freq: Two times a day (BID) | INTRAVENOUS | Status: DC

## 2017-10-02 MED ORDER — PANTOPRAZOLE SODIUM 40 MG IV SOLR
40.0000 mg | Freq: Two times a day (BID) | INTRAVENOUS | Status: DC
  Administered 2017-10-05 – 2017-10-06 (×2): 40 mg via INTRAVENOUS
  Filled 2017-10-02 (×3): qty 40

## 2017-10-02 MED ORDER — SODIUM CHLORIDE 0.9 % IV BOLUS (SEPSIS)
500.0000 mL | INTRAVENOUS | Status: AC
  Administered 2017-10-02: 500 mL via INTRAVENOUS

## 2017-10-02 MED ORDER — SODIUM CHLORIDE 0.9% FLUSH
3.0000 mL | Freq: Two times a day (BID) | INTRAVENOUS | Status: DC
  Administered 2017-10-02 – 2017-10-06 (×8): 3 mL via INTRAVENOUS

## 2017-10-02 MED ORDER — ONDANSETRON HCL 4 MG/2ML IJ SOLN
4.0000 mg | Freq: Four times a day (QID) | INTRAMUSCULAR | Status: DC | PRN
  Administered 2017-10-05: 4 mg via INTRAVENOUS
  Filled 2017-10-02: qty 2

## 2017-10-02 MED ORDER — PROMETHAZINE HCL 25 MG PO TABS: 12.5000 mg | ORAL_TABLET | Freq: Four times a day (QID) | ORAL | Status: DC | PRN

## 2017-10-02 MED ORDER — SODIUM CHLORIDE 0.9 % IV SOLN
Freq: Once | INTRAVENOUS | Status: AC
  Administered 2017-10-02: 12:00:00 via INTRAVENOUS

## 2017-10-02 MED ORDER — MORPHINE SULFATE (PF) 4 MG/ML IV SOLN: 1.0000 mg | INTRAVENOUS | Status: DC | PRN

## 2017-10-02 NOTE — Progress Notes (Signed)
Hospitalist progress note   Jill Short  UDJ:[id number] DOB: 10-09-1930 DOA: 10/02/2017 PCP: Flossie Buffy, NP   Specialists:   Brief Narrative:   84 y/o ? Known extensive diverticulosis with LGIB-not great candidate for surgical management of the same Chr Afib [place] > 3 prior on eliquis on PPM--patient is currently on 81 mg of aspirin htn Dm ty ii prior on metformin  Presents with dark and tarry stool since early this morning  3/14 has had 2 episodes Has not been taking any over-the-counter nonsteroidals does not drink alcohol and has had no overt illnesses recently No abdominal pain, some mild dizziness Note that patient has been on iron-hemoglobin down from 13.3 last checked 07/11/2017   Assessment & Plan:   Assessment:  The encounter diagnosis was Rectal bleeding.  ?  GI bleed-patient has known extensive diverticulosis-we will monitor and observe empirically-GI has been consulted to see the patient and we appreciate their input.  Patient has been placed on Protonix GTT and normal saline 100 cc/h-->50 cc/H. will obviously hold off on aspirin.  It is unlikely there is any surgical indication at this time  Chronic atrial fibrillation status post PPM [place] 2016 (bradycardia) chads score >3-patient is normotensive with slightly low systolic blood pressures so we will hold off on home dose metoprolol 25 daily  Hyperlipidemia holding Lofibra 200 a.m.  B12 deficiency continue B12 p.o. daily  Osteoporosis continue vitamin D and cholecalciferol once seen by GI   DVT prophylaxis: SCD given active GI bleed code Status:   Full code   family Communication:   Discussed with daughter at bedside Disposition Plan: Inpatient change to telemetry later today if stabilized gastroenterology   Consultants:   Gastroenterology  Procedures:   None  Antimicrobials:   None  Subjective: Awake alert oriented high functional baseline No distress No nausea no  vomiting   Objective: Vitals:   10/02/17 0244 10/02/17 0256 10/02/17 0717  BP: 134/68  131/62  Pulse: 63  70  Resp: 16  18  Temp: 97.8 F (36.6 C)  (!) 97.5 F (36.4 C)  TempSrc: Oral  Oral  SpO2: 95%  100%  Weight:  56.7 kg (125 lb)   Height:  5\' 1"  (1.549 m)    No intake or output data in the 24 hours ending 10/02/17 0803 Filed Weights   10/02/17 0256  Weight: 56.7 kg (125 lb)    Examination: EOMI NCAT mild pallor no icterus Abdomen soft slightly distended nontender in epigastrium - hepatosplenomegaly no lower extremity edema Neurologically intact without deficit Psych euthymic  Data Reviewed: I have personally reviewed following labs and imaging studies  CBC: Recent Labs  Lab 10/02/17 0301 10/02/17 0541  WBC 8.1  --   HGB 11.8* 11.2*  HCT 35.3* 34.6*  MCV 95.9  --   PLT 338  --    Basic Metabolic Panel: Recent Labs  Lab 10/02/17 0301  NA 137  K 4.6  CL 103  CO2 24  GLUCOSE 101*  BUN 35*  CREATININE 0.88  CALCIUM 9.2   GFR: Estimated Creatinine Clearance: 35.3 mL/min (by C-G formula based on SCr of 0.88 mg/dL). Liver Function Tests: Recent Labs  Lab 10/02/17 0301  AST 25  ALT 12*  ALKPHOS 43  BILITOT 0.5  PROT 7.2  ALBUMIN 3.4*   No results for input(s): LIPASE, AMYLASE in the last 168 hours. No results for input(s): AMMONIA in the last 168 hours. Coagulation Profile: No results for input(s): INR, PROTIME in the last  168 hours. Cardiac Enzymes: No results for input(s): CKTOTAL, CKMB, CKMBINDEX, TROPONINI in the last 168 hours. CBG: No results for input(s): GLUCAP in the last 168 hours. Urine analysis:    Component Value Date/Time   COLORURINE DARK YELLOW 07/11/2017 1549   APPEARANCEUR TURBID (A) 07/11/2017 1549   LABSPEC 1.018 07/11/2017 1549   PHURINE 6.5 07/11/2017 1549   GLUCOSEU NEGATIVE 07/11/2017 1549   HGBUR NEGATIVE 07/11/2017 1549   KETONESUR NEGATIVE 07/11/2017 1549   PROTEINUR NEGATIVE 07/11/2017 1549      Radiology Studies: Reviewed images personally in health database    Scheduled Meds: . [START ON 10/05/2017] pantoprazole  40 mg Intravenous Q12H  . sodium chloride flush  3 mL Intravenous Q12H   Continuous Infusions: . sodium chloride 1,000 mL (10/02/17 0724)  . sodium chloride    . pantoprozole (PROTONIX) infusion 8 mg/hr (10/02/17 0651)     LOS: 0 days    Time spent: no charge    Verneita Griffes, MD [facility] ([phone]   If 7PM-7AM, please contact night-coverage [email or web address] Password [facility] 10/02/2017, 8:03 AM

## 2017-10-02 NOTE — ED Provider Notes (Signed)
[facility] EMERGENCY DEPARTMENT Provider Note   CSN: [id number] Arrival date & time: 10/02/17  1660     History   Chief Complaint Chief Complaint  Patient presents with  . Rectal Bleeding    HPI Jill Short is a 84 y.o. female.  HPI 84 year old female past medical history significant for diverticular bleed presents to the emergency department today with complaints of GI bleed.  States she woke up approximately 1:00 this morning with bright red blood per rectum.  She has had several bowel movements with red stool.  States that this time the stool has now turned dark red to black in color.  The patient reports history of GI bleed that has required hospitalization and blood transfusion before.  Patient denies any associated symptoms including palpitations, shortness of breath, abdominal pain.  Denies any associated fevers.  Patient does not have a GI doctor at present time.  Denies any blood thinners.  Pt denies any fever, chill, ha, vision changes, lightheadedness, dizziness, congestion, neck pain, cp, sob, cough, abd pain, n/v/d, urinary symptoms, change in bowel habits, lower extremity paresthesias.  Past Medical History:  Diagnosis Date  . Arthritis   . Atrial fibrillation ([place])   . Coronary artery disease   . Diverticulosis   . Hypertension   . Lower GI bleed   . Pacemaker   . Rectal bleed 07/2016    Patient Active Problem List   Diagnosis Date Noted  . Acute GI bleeding 10/02/2017  . Anosmia 07/11/2017  . H/O cataract removal with insertion of prosthetic lens 07/11/2017  . Skin aging 02/10/2017  . BPPV (benign paroxysmal positional vertigo) 09/20/2016  . Chronic constipation 09/20/2016  . Hypokalemia 06/16/2016  . Aortic stenosis 06/09/2016  . Coronary artery disease 06/09/2016  . Hyperlipemia 05/17/2016  . Cardiac pacemaker in situ 01/02/2015  . Paroxysmal atrial fibrillation ([place]) 01/02/2015  . Chronic GI bleeding 01/01/2015    Past  Surgical History:  Procedure Laterality Date  . cataract surgery Bilateral 06/18/2017 and 07/09/2017  . COLONOSCOPY N/A 01/06/2015   Procedure: COLONOSCOPY;  Surgeon: Laurence Spates, MD;  Location: [facility] ENDOSCOPY;  Service: Endoscopy;  Laterality: N/A;  . ESOPHAGOGASTRODUODENOSCOPY N/A 01/13/2015   Procedure: ESOPHAGOGASTRODUODENOSCOPY (EGD);  Surgeon: Laurence Spates, MD;  Location: [facility] ENDOSCOPY;  Service: Endoscopy;  Laterality: N/A;  . INSERT / REPLACE / REMOVE PACEMAKER  11/2015  . TOTAL HIP ARTHROPLASTY Bilateral     OB History    No data available       Home Medications    Prior to Admission medications   Medication Sig Start Date End Date Taking? Authorizing Provider  aspirin EC 81 MG tablet Take 1 tablet (81 mg total) by mouth daily. 10/16/16 10/16/17 Yes Burns, Arloa Koh, MD  calcium-vitamin D (OSCAL WITH D) 500-200 MG-UNIT tablet Take 1 tablet by mouth daily with breakfast. 06/14/16  Yes [name], MD  cholecalciferol (VITAMIN D) 1000 UNITS tablet Take 1,000 Units by mouth daily.   Yes [provider]  Cyanocobalamin (B-12 PO) Take 1 tablet by mouth daily.   Yes [provider]  fenofibrate micronized (LOFIBRA) 200 MG capsule Take 200 mg by mouth daily before breakfast.   Yes [provider]  ferrous sulfate 325 (65 FE) MG tablet Take 1 tablet (325 mg total) by mouth daily with breakfast. 09/23/17  Yes Nche, Charlene Brooke, NP  Flaxseed, Linseed, (FLAX PO) Take 1,200 mg by mouth daily.   Yes [provider]  metoprolol succinate (TOPROL-XL) 25 MG  24 hr tablet Take 1 tablet (25 mg total) by mouth daily. 09/25/16  Yes Burns, Alexa R, MD  pantoprazole (PROTONIX) 40 MG tablet TAKE ONE TABLET BY MOUTH DAILY 02/16/15  Yes Burns, Alexa R, MD  polycarbophil (FIBERCON) 625 MG tablet Take 1 tablet (625 mg total) by mouth daily. 06/15/16  Yes [name], MD  polyethylene glycol powder (GLYCOLAX/MIRALAX) powder Take 17 g by mouth daily as needed. Patient taking  differently: Take 17 g by mouth daily as needed for mild constipation.  09/05/17  Yes Nche, Charlene Brooke, NP  potassium chloride (K-DUR) 10 MEQ tablet Take 2 tablets (20 mEq total) by mouth daily. 10/16/16  Yes Burns, Arloa Koh, MD  vitamin C (ASCORBIC ACID) 500 MG tablet Take 500 mg by mouth daily.   Yes [provider]    Family History No family history on file.  Social History Social History   Tobacco Use  . Smoking status: Never Smoker  . Smokeless tobacco: Never Used  Substance Use Topics  . Alcohol use: No  . Drug use: No     Allergies   Patient has no known allergies.   Review of Systems Review of Systems  All other systems reviewed and are negative.    Physical Exam Updated Vital Signs BP 134/68 (BP Location: Left Arm)   Pulse 63   Temp 97.8 F (36.6 C) (Oral)   Resp 16   Ht 5\' 1"  (1.549 m)   Wt 56.7 kg (125 lb)   SpO2 95%   BMI 23.62 kg/m   Physical Exam  Constitutional: She is oriented to person, place, and time. She appears well-developed and well-nourished.  Non-toxic appearance. No distress.  HENT:  Head: Normocephalic and atraumatic.  Nose: Nose normal.  Mouth/Throat: Oropharynx is clear and moist.  Eyes: Conjunctivae are normal. Pupils are equal, round, and reactive to light. Right eye exhibits no discharge. Left eye exhibits no discharge.  Neck: Normal range of motion. Neck supple.  Cardiovascular: Normal rate, regular rhythm, normal heart sounds and intact distal pulses. Exam reveals no friction rub.  No murmur heard. Pulmonary/Chest: Effort normal and breath sounds normal. No respiratory distress. She exhibits no tenderness.  Abdominal: Soft. Bowel sounds are normal. There is no tenderness. There is no rebound and no guarding.  Genitourinary:  Genitourinary Comments: Chaperone present for exam. Pt tolerated without difficulty. No external hemorrhoids or fissures noted. No pain with palpation of the rectal vault. No internal hemorrhoids  noted. Soft brown stool noted in the rectal vault. No gross hematochezia/melena. Hemoccult positive.   Musculoskeletal: Normal range of motion. She exhibits no tenderness.  Lymphadenopathy:    She has no cervical adenopathy.  Neurological: She is alert and oriented to person, place, and time.  Skin: Skin is warm and dry. Capillary refill takes less than 2 seconds.  Psychiatric: Her behavior is normal. Judgment and thought content normal.  Nursing note and vitals reviewed.    ED Treatments / Results  Labs (all labs ordered are listed, but only abnormal results are displayed) Labs Reviewed  COMPREHENSIVE METABOLIC PANEL - Abnormal; Notable for the following components:      Result Value   Glucose, Bld 101 (*)    BUN 35 (*)    Albumin 3.4 (*)    ALT 12 (*)    GFR calc non Af Amer 58 (*)    All other components within normal limits  CBC - Abnormal; Notable for the following components:   RBC 3.68 (*)  Hemoglobin 11.8 (*)    HCT 35.3 (*)    All other components within normal limits  HEMOGLOBIN AND HEMATOCRIT, BLOOD - Abnormal; Notable for the following components:   Hemoglobin 11.2 (*)    HCT 34.6 (*)    All other components within normal limits  POC OCCULT BLOOD, ED - Abnormal; Notable for the following components:   Fecal Occult Bld POSITIVE (*)    All other components within normal limits  HEMOGLOBIN AND HEMATOCRIT, BLOOD  HEMOGLOBIN AND HEMATOCRIT, BLOOD  TYPE AND SCREEN  PREPARE RBC (CROSSMATCH)    EKG  EKG Interpretation None       Radiology No results found.  Procedures .Critical Care Performed by: Doristine Devoid, PA-C Authorized by: Doristine Devoid, PA-C   Critical care provider statement:    Critical care time (minutes):  45   Critical care was necessary to treat or prevent imminent or life-threatening deterioration of the following conditions: GI bleed requiring blood transfusion.   Critical care was time spent personally by me on the  following activities:  Blood draw for specimens, development of treatment plan with patient or surrogate, discussions with consultants, evaluation of patient's response to treatment, discussions with primary provider, examination of patient, ordering and performing treatments and interventions, ordering and review of laboratory studies, ordering and review of radiographic studies, pulse oximetry, re-evaluation of patient's condition and review of old charts   (including critical care time)  Medications Ordered in ED Medications  0.9 %  sodium chloride infusion (not administered)  ondansetron (ZOFRAN) injection 4 mg (not administered)  morphine 4 MG/ML injection 1-3 mg (not administered)  0.9 %  sodium chloride infusion (not administered)  sodium chloride 0.9 % bolus 500 mL (not administered)  pantoprazole (PROTONIX) 80 mg in sodium chloride 0.9 % 100 mL IVPB (not administered)  pantoprazole (PROTONIX) 80 mg in sodium chloride 0.9 % 250 mL (0.32 mg/mL) infusion (not administered)  pantoprazole (PROTONIX) injection 40 mg (not administered)  sodium chloride flush (NS) 0.9 % injection 3 mL (not administered)  acetaminophen (TYLENOL) tablet 650 mg (not administered)    Or  acetaminophen (TYLENOL) suppository 650 mg (not administered)  promethazine (PHENERGAN) tablet 12.5 mg (not administered)     Initial Impression / Assessment and Plan / ED Course  I have reviewed the triage vital signs and the nursing notes.  Pertinent labs & imaging results that were available during my care of the patient were reviewed by me and considered in my medical decision making (see chart for details).     84 year old female with past medical history significant for diverticular bleeds presents to the emergency department today for evaluation of rectal bleeding that started approximately 1 AM this morning.  Patient denies any associated symptoms including palpitations, shortness of breath, abdominal pain, nausea,  emesis, chest pain or shortness of breath.   Patient's vital signs are reassuring at this time.  She is afebrile, no tachycardia or hypotension noted.  On exam patient has no focal abdominal tenderness.  Heart regular rate and rhythm.  Lungs clear to auscultation bilaterally.  Frank hematochezia melena noted on exam.  Lab work reveals no leukocytosis.  Hemoglobin appears at patient's baseline is stable at 11.8.  Mild elevation in BUN.  Kidney function is normal.  Hemoccult was positive.  Patient has had continuous bloody stools on the ED.  Her color has become pale.  Patient remains without any hypotension or tachycardia.  I spoke with Dr. Paulita Fujita with GI who will see patient  in consultation.  Has asked for hospital admission.  Spoke with Dr. opened with hospital medicine who will see patient in the ED and place admission orders.  Will order 1 unit of blood given her significant GI bleed at this time.   Patient was seen and evaluated my attending who is agreed with the above plan.      Final Clinical Impressions(s) / ED Diagnoses   Final diagnoses:  Rectal bleeding    ED Discharge Orders    None       Aaron Edelman 94/70/76 1518    Delora Fuel, MD 34/37/35 [id number]    Delora Fuel, MD 84/78/41 [phone]

## 2017-10-02 NOTE — ED Notes (Signed)
ED Provider at bedside. 

## 2017-10-02 NOTE — H&P (Signed)
History and Physical    Jill Short GEX:[id number] DOB: [birth year] DOA: 10/02/2017  PCP: Kathi Ludwig, MD   Patient coming from: Home  Chief Complaint: Rectal bleeding  HPI: Jill Short is a 84 y.o. female with medical history significant for coronary artery disease, diverticulosis with recurrent GI bleeding, and atrial fibrillation no longer anticoagulated due to recurrent GI bleeds, now presenting to the emergency department with rectal bleeding.  Patient reports that she been in her usual state of health until she had an urge to defecate overnight and passed red blood.  She initially denies any abdominal pain, nausea, or lightheadedness, but goes on to develop nausea and lightheadedness in the emergency department.  ED Course: Upon arrival to the ED, patient is found to be afebrile, saturating well on room air, and with vitals otherwise normal.  Chemistry panel is notable for BUN of 35 with creatinine of 0.88.  CBC is notable for hemoglobin of 11.8, down from 13.3 in December.  Fecal occult blood testing is positive.  While in the emergency department.  Patient began to have copious black stool, has become pale, and reports nausea and lightheadedness now.  Gastroenterology was consulted by the ED physician.  Type and screen was performed and 1 unit of packed red blood cells was ordered for immediate transfusion.  Patient remains hemodynamically stable at this time, will be started on IV PPI and admitted to the stepdown unit for ongoing evaluation and management of acute GI bleed, concern for upper GI source.  Review of Systems:  All other systems reviewed and apart from HPI, are negative.  Past Medical History:  Diagnosis Date  . Arthritis   . Atrial fibrillation ([place])   . Coronary artery disease   . Diverticulosis   . Hypertension   . Lower GI bleed   . Pacemaker   . Rectal bleed 07/2016    Past Surgical History:  Procedure Laterality Date  . cataract surgery  Bilateral 06/18/2017 and 07/09/2017  . COLONOSCOPY N/A 01/06/2015   Procedure: COLONOSCOPY;  Surgeon: Laurence Spates, MD;  Location[facility] ENDOSCOPY;  Service: Endoscopy;  Laterality: N/A;  . ESOPHAGOGASTRODUODENOSCOPY N/A 01/13/2015   Procedure: ESOPHAGOGASTRODUODENOSCOPY (EGD);  Surgeon: Laurence Spates, MD;  Location: [facility] ENDOSCOPY;  Service: Endoscopy;  Laterality: N/A;  . INSERT / REPLACE / REMOVE PACEMAKER  11/2015  . TOTAL HIP ARTHROPLASTY Bilateral      reports that  has never smoked. she has never used smokeless tobacco. She reports that she does not drink alcohol or use drugs.  No Known Allergies  No family history on file.   Prior to Admission medications   Medication Sig Start Date End Date Taking? Authorizing Provider  aspirin EC 81 MG tablet Take 1 tablet (81 mg total) by mouth daily. 10/16/16 10/16/17  Florinda Marker, MD  calcium-vitamin D (OSCAL WITH D) 500-200 MG-UNIT tablet Take 1 tablet by mouth daily with breakfast. 06/14/16   [name], MD  cholecalciferol (VITAMIN D) 1000 UNITS tablet Take 1,000 Units by mouth daily.    [provider]  Cyanocobalamin (B-12 PO) Take 1 tablet by mouth daily.    [provider]  fenofibrate micronized (LOFIBRA) 200 MG capsule Take 200 mg by mouth daily before breakfast.    [provider]  ferrous sulfate 325 (65 FE) MG tablet Take 1 tablet (325 mg total) by mouth daily with breakfast. 09/23/17   Nche, Charlene Brooke, NP  Flaxseed, Linseed, (FLAX PO) Take 1,200 mg by mouth daily.  [provider]  metoprolol succinate (TOPROL-XL) 25 MG 24 hr tablet Take 1 tablet (25 mg total) by mouth daily. 09/25/16   Burns, Arloa Koh, MD  pantoprazole (PROTONIX) 40 MG tablet TAKE ONE TABLET BY MOUTH DAILY 02/16/15   Burns, Arloa Koh, MD  polycarbophil (FIBERCON) 625 MG tablet Take 1 tablet (625 mg total) by mouth daily. 06/15/16   [name], MD  polyethylene glycol powder (GLYCOLAX/MIRALAX) powder Take 17 g by mouth daily as  needed. 09/05/17   Nche, Charlene Brooke, NP  potassium chloride (K-DUR) 10 MEQ tablet Take 2 tablets (20 mEq total) by mouth daily. 10/16/16   Burns, Arloa Koh, MD  vitamin C (ASCORBIC ACID) 500 MG tablet Take 500 mg by mouth daily.    [provider]    Physical Exam: Vitals:   10/02/17 0244 10/02/17 0256  BP: 134/68   Pulse: 63   Resp: 16   Temp: 97.8 F (36.6 C)   TempSrc: Oral   SpO2: 95%   Weight:  56.7 kg (125 lb)  Height:  5\' 1"  (1.549 m)      Constitutional: NAD, calm, pale Eyes: PERTLA, lids and conjunctivae normal ENMT: Mucous membranes are moist. Posterior pharynx clear of any exudate or lesions.   Neck: normal, supple, no masses, no thyromegaly Respiratory: clear to auscultation bilaterally, no wheezing, no crackles. Normal respiratory effort.   Cardiovascular: Rate ~80 and irregularly irregular. No significant JVD. Abdomen: No distension, soft, mild tenderness throughout, no rebound pain or guarding. Bowel sounds active.  Musculoskeletal: no clubbing / cyanosis. No joint deformity upper and lower extremities.  Skin: no significant rashes, lesions, ulcers. Pale. Neurologic: No facial asymmetry. Moving all extremities.   Psychiatric: Alert and oriented x 3. Calm, cooperative.     Labs on Admission: I have personally reviewed following labs and imaging studies  CBC: Recent Labs  Lab 10/02/17 0301  WBC 8.1  HGB 11.8*  HCT 35.3*  MCV 95.9  PLT 387   Basic Metabolic Panel: Recent Labs  Lab 10/02/17 0301  NA 137  K 4.6  CL 103  CO2 24  GLUCOSE 101*  BUN 35*  CREATININE 0.88  CALCIUM 9.2   GFR: Estimated Creatinine Clearance: 35.3 mL/min (by C-G formula based on SCr of 0.88 mg/dL). Liver Function Tests: Recent Labs  Lab 10/02/17 0301  AST 25  ALT 12*  ALKPHOS 43  BILITOT 0.5  PROT 7.2  ALBUMIN 3.4*   No results for input(s): LIPASE, AMYLASE in the last 168 hours. No results for input(s): AMMONIA in the last 168 hours. Coagulation  Profile: No results for input(s): INR, PROTIME in the last 168 hours. Cardiac Enzymes: No results for input(s): CKTOTAL, CKMB, CKMBINDEX, TROPONINI in the last 168 hours. BNP (last 3 results) No results for input(s): PROBNP in the last 8760 hours. HbA1C: No results for input(s): HGBA1C in the last 72 hours. CBG: No results for input(s): GLUCAP in the last 168 hours. Lipid Profile: No results for input(s): CHOL, HDL, LDLCALC, TRIG, CHOLHDL, LDLDIRECT in the last 72 hours. Thyroid Function Tests: No results for input(s): TSH, T4TOTAL, FREET4, T3FREE, THYROIDAB in the last 72 hours. Anemia Panel: No results for input(s): VITAMINB12, FOLATE, FERRITIN, TIBC, IRON, RETICCTPCT in the last 72 hours. Urine analysis:    Component Value Date/Time   COLORURINE DARK YELLOW 07/11/2017 1549   APPEARANCEUR TURBID (A) 07/11/2017 1549   LABSPEC 1.018 07/11/2017 1549   PHURINE 6.5 07/11/2017 1549   GLUCOSEU NEGATIVE 07/11/2017 1549   HGBUR NEGATIVE  07/11/2017 [place] 07/11/2017 1549   PROTEINUR NEGATIVE 07/11/2017 1549   Sepsis Labs: @LABRCNTIP (procalcitonin:4,lacticidven:4) )No results found for this or any previous visit (from the past 240 hour(s)).   Radiological Exams on Admission: No results found.  EKG: Ordered and pending.   Assessment/Plan  1. Acute GI bleeding  - Presents with red blood per rectum at home, now recurrent episodes of black stool in ED   - No abdominal pain, but now reporting nausea   - Hemodynamically stable on arrival, but developing lightheadedness in ED with ongoing black stools  - Hgb is 11.8 initially, down from 13.3 in December 2018  - Type and screen was collected in ED and 1 unit pRBC's ordered d/t ongoing bleeding with lightheadedness and pallor  - Hx of recurrent diverticular bleeds, but with black stool now, concerned for upper GI source  - EGD in 2016 with hiatal hernia, otherwise unremarkable; colonoscopy 2016 with severe  diverticulosis throughout  - GI is consulting and much appreciated - Given fluid bolus while waiting for blood, start IV PPI, follow serial H&H   2. Paroxysmal atrial fibrillation  - Rate-controlled on admission  - CHADS-VASc is at least 3 (age x2, CAD)  - Previously on Eliquis, stopped d/t recurrent GI bleeds   - Continue cardiac monitoring  - Hold metoprolol initially in light of acute GIB   3. CAD  - No anginal complaints  - ASA and beta-blocker held for acute GIB    DVT prophylaxis: SCD's  Code Status: Full  Family Communication: Daughter updated at bedside Consults called: Gastroenterology Admission status: Inpatient    Vianne Bulls, MD [facility] Pager [phone]  If 7PM-7AM, please contact night-coverage [email or web address] Password [email or web address]  10/02/2017, 5:18 AM

## 2017-10-02 NOTE — ED Notes (Signed)
Dr Opyd  at bedside-Monique,RN  

## 2017-10-02 NOTE — Consult Note (Signed)
[facility] Gastroenterology Consultation Note  Referring Provider: Dr. Verneita Griffes [facility])     Primary Care Physician:  Nche, Charlene Brooke, NP  Reason for Consultation:  GI bleeding  HPI: Jill Short is a 84 y.o. female presenting with blood in stool.  Black (on iron) versus red, patient not sure.  No abdominal pain, nausea, vomiting, hematemesis.  Eating well, denies weight loss.  Similar presentation in 2016; endoscopy and colonoscopy ultimately done, and bleeding felt to be diverticular in nature.  Was on apixaban at that time; is now on aspirin monotherapy for antiplatelet/anticoagulant.   Past Medical History:  Diagnosis Date  . Arthritis   . Atrial fibrillation ([place])   . Coronary artery disease   . Diverticulosis   . Hypertension   . Lower GI bleed   . Pacemaker   . Rectal bleed 07/2016    Past Surgical History:  Procedure Laterality Date  . cataract surgery Bilateral 06/18/2017 and 07/09/2017  . COLONOSCOPY N/A 01/06/2015   Procedure: COLONOSCOPY;  Surgeon: Laurence Spates, MD;  Location: [facility] ENDOSCOPY;  Service: Endoscopy;  Laterality: N/A;  . ESOPHAGOGASTRODUODENOSCOPY N/A 01/13/2015   Procedure: ESOPHAGOGASTRODUODENOSCOPY (EGD);  Surgeon: Laurence Spates, MD;  Location: [facility] ENDOSCOPY;  Service: Endoscopy;  Laterality: N/A;  . INSERT / REPLACE / REMOVE PACEMAKER  11/2015  . TOTAL HIP ARTHROPLASTY Bilateral     Prior to Admission medications   Medication Sig Start Date End Date Taking? Authorizing Provider  aspirin EC 81 MG tablet Take 1 tablet (81 mg total) by mouth daily. 10/16/16 10/16/17 Yes Burns, Arloa Koh, MD  calcium-vitamin D (OSCAL WITH D) 500-200 MG-UNIT tablet Take 1 tablet by mouth daily with breakfast. 06/14/16  Yes [name], MD  cholecalciferol (VITAMIN D) 1000 UNITS tablet Take 1,000 Units by mouth daily.   Yes [provider]  Cyanocobalamin (B-12 PO) Take 1 tablet by mouth daily.   Yes [provider]  fenofibrate micronized (LOFIBRA) 200 MG  capsule Take 200 mg by mouth daily before breakfast.   Yes [provider]  ferrous sulfate 325 (65 FE) MG tablet Take 1 tablet (325 mg total) by mouth daily with breakfast. 09/23/17  Yes Nche, Charlene Brooke, NP  Flaxseed, Linseed, (FLAX PO) Take 1,200 mg by mouth daily.   Yes [provider]  metoprolol succinate (TOPROL-XL) 25 MG 24 hr tablet Take 1 tablet (25 mg total) by mouth daily. 09/25/16  Yes Burns, Alexa R, MD  pantoprazole (PROTONIX) 40 MG tablet TAKE ONE TABLET BY MOUTH DAILY 02/16/15  Yes Burns, Alexa R, MD  polycarbophil (FIBERCON) 625 MG tablet Take 1 tablet (625 mg total) by mouth daily. 06/15/16  Yes [name], MD  polyethylene glycol powder (GLYCOLAX/MIRALAX) powder Take 17 g by mouth daily as needed. Patient taking differently: Take 17 g by mouth daily as needed for mild constipation.  09/05/17  Yes Nche, Charlene Brooke, NP  potassium chloride (K-DUR) 10 MEQ tablet Take 2 tablets (20 mEq total) by mouth daily. 10/16/16  Yes Burns, Arloa Koh, MD  vitamin C (ASCORBIC ACID) 500 MG tablet Take 500 mg by mouth daily.   Yes [provider]    Current Facility-Administered Medications  Medication Dose Route Frequency Provider Last Rate Last Dose  . 0.9 %  sodium chloride infusion   Intravenous Continuous Nita Sells, MD 50 mL/hr at 10/02/17 1220    . acetaminophen (TYLENOL) tablet 650 mg  650 mg Oral Q6H PRN Opyd, Ilene Qua, MD       Or  .  acetaminophen (TYLENOL) suppository 650 mg  650 mg Rectal Q6H PRN Opyd, Ilene Qua, MD      . morphine 4 MG/ML injection 1-3 mg  1-3 mg Intravenous Q4H PRN Opyd, Ilene Qua, MD      . ondansetron (ZOFRAN) injection 4 mg  4 mg Intravenous Q6H PRN Opyd, Ilene Qua, MD      . pantoprazole (PROTONIX) 80 mg in sodium chloride 0.9 % 250 mL (0.32 mg/mL) infusion  8 mg/hr Intravenous Continuous Opyd, Ilene Qua, MD 25 mL/hr at 10/02/17 0651 8 mg/hr at 10/02/17 0651  . [START ON 10/05/2017] pantoprazole (PROTONIX) injection 40 mg   40 mg Intravenous Q12H Opyd, Ilene Qua, MD      . promethazine (PHENERGAN) tablet 12.5 mg  12.5 mg Oral Q6H PRN Opyd, Ilene Qua, MD      . sodium chloride flush (NS) 0.9 % injection 3 mL  3 mL Intravenous Q12H Opyd, Ilene Qua, MD       Current Outpatient Medications  Medication Sig Dispense Refill  . aspirin EC 81 MG tablet Take 1 tablet (81 mg total) by mouth daily. 90 tablet 3  . calcium-vitamin D (OSCAL WITH D) 500-200 MG-UNIT tablet Take 1 tablet by mouth daily with breakfast.    . cholecalciferol (VITAMIN D) 1000 UNITS tablet Take 1,000 Units by mouth daily.    . Cyanocobalamin (B-12 PO) Take 1 tablet by mouth daily.    . fenofibrate micronized (LOFIBRA) 200 MG capsule Take 200 mg by mouth daily before breakfast.    . ferrous sulfate 325 (65 FE) MG tablet Take 1 tablet (325 mg total) by mouth daily with breakfast. 90 tablet 1  . Flaxseed, Linseed, (FLAX PO) Take 1,200 mg by mouth daily.    . metoprolol succinate (TOPROL-XL) 25 MG 24 hr tablet Take 1 tablet (25 mg total) by mouth daily. 90 tablet 3  . pantoprazole (PROTONIX) 40 MG tablet TAKE ONE TABLET BY MOUTH DAILY 30 tablet 11  . polycarbophil (FIBERCON) 625 MG tablet Take 1 tablet (625 mg total) by mouth daily. 60 tablet 2  . polyethylene glycol powder (GLYCOLAX/MIRALAX) powder Take 17 g by mouth daily as needed. (Patient taking differently: Take 17 g by mouth daily as needed for mild constipation. ) 850 g 11  . potassium chloride (K-DUR) 10 MEQ tablet Take 2 tablets (20 mEq total) by mouth daily. 180 tablet 3  . vitamin C (ASCORBIC ACID) 500 MG tablet Take 500 mg by mouth daily.      Allergies as of 10/02/2017  . (No Known Allergies)    No family history on file.  Social History   Socioeconomic History  . Marital status: Married    Spouse name: Not on file  . Number of children: Not on file  . Years of education: Not on file  . Highest education level: Not on file  Social Needs  . Financial resource strain: Not on file   . Food insecurity - worry: Not on file  . Food insecurity - inability: Not on file  . Transportation needs - medical: Not on file  . Transportation needs - non-medical: Not on file  Occupational History  . Not on file  Tobacco Use  . Smoking status: Never Smoker  . Smokeless tobacco: Never Used  Substance and Sexual Activity  . Alcohol use: No  . Drug use: No  . Sexual activity: Not on file  Other Topics Concern  . Not on file  Social History Narrative  . Not on  file    Review of Systems: As per HPI, all others negative  Physical Exam: Vital signs in last 24 hours: Temp:  [97.5 F (36.4 C)-97.8 F (36.6 C)] 97.8 F (36.6 C) (03/14 1135) Pulse Rate:  [63-71] 70 (03/14 1135) Resp:  [16-18] 18 (03/14 1135) BP: (113-134)/(62-79) 120/65 (03/14 1135) SpO2:  [95 %-100 %] 97 % (03/14 1135) Weight:  [125 lb (56.7 kg)] 125 lb (56.7 kg) (03/14 0256)   General:   Alert, frail- and chronically ill-appearing, thin, pleasant and cooperative in NAD Head:  Normocephalic and atraumatic. Eyes:  Sclera clear, no icterus.   Conjunctiva pink. Ears:  Normal auditory acuity. Nose:  No deformity, discharge,  or lesions. Mouth:  No deformity or lesions.  Oropharynx pink & moist. Neck:  Supple; no masses or thyromegaly. Lungs:  Clear throughout to auscultation.   No wheezes, crackles, or rhonchi. No acute distress. Heart:  Regular rate and rhythm; III/VI holosystolic murmur heard best at apex; no clicks, rubs,  or gallops. Abdomen:  Soft, nontender and nondistended. No masses, hepatosplenomegaly or hernias noted. Normal bowel sounds, without guarding, and without rebound.     Msk:  Symmetrical without gross deformities. Normal posture. Pulses:  Normal pulses noted. Extremities:  Without clubbing or edema. Neurologic:  Alert and  oriented x4;  grossly normal neurologically. Skin:  Scattered ecchymoses, thin and fragile skin, otherwise intact without significant lesions or rashes. Psych:  Alert  and cooperative. Normal mood and affect.   Lab Results: Recent Labs    10/02/17 0301 10/02/17 0541  WBC 8.1  --   HGB 11.8* 11.2*  HCT 35.3* 34.6*  PLT 338  --    BMET Recent Labs    10/02/17 0301  NA 137  K 4.6  CL 103  CO2 24  GLUCOSE 101*  BUN 35*  CREATININE 0.88  CALCIUM 9.2   LFT Recent Labs    10/02/17 0301  PROT 7.2  ALBUMIN 3.4*  AST 25  ALT 12*  ALKPHOS 43  BILITOT 0.5   PT/INR No results for input(s): LABPROT, INR in the last 72 hours.  Studies/Results: No results found.  Impression:  1.  Blood in stool, black (on iron) versus red (patient not sure).  Prior similar (per patient) work-up in 2016 with EGD and colonoscopy, felt to have had diverticular bleeding. 2.  Anemia, mild. 3.  Multiple other medical problems.  Plan:  1.  Clear liquid diet. 2.  Follow CBCs. 3.  IV fluids. 4.  If patient has witnessed ongoing bleeding, would consider tagged RBC study as next step in management; would not pursue endoscopy/colonoscopy at the present time.   LOS: 0 days   Raynald Rouillard M  10/02/2017, 12:31 PM  Cell [phone] If no answer or after 5 PM call [phone]

## 2017-10-02 NOTE — Progress Notes (Signed)
Pt admitted to the unit at 1540. Pt mental status is alert. Pt oriented to room, staff, and call bell. Skin is intact. Full assessment charted in CHL. Call bell within reach. Visitor guidelines reviewed w/ pt and/or family.

## 2017-10-02 NOTE — ED Triage Notes (Signed)
Pt reports waking up at 0100 with rectal bleeding. Pt has hx of GI bleed. Denies abd pain, N/V.

## 2017-10-03 LAB — CBC WITH DIFFERENTIAL/PLATELET
BASOS ABS: 0 10*3/uL (ref 0.0–0.1)
Basophils Relative: 0 %
EOS PCT: 3 %
Eosinophils Absolute: 0.2 10*3/uL (ref 0.0–0.7)
HEMATOCRIT: 32.7 % — AB (ref 36.0–46.0)
Hemoglobin: 10.8 g/dL — ABNORMAL LOW (ref 12.0–15.0)
LYMPHS ABS: 2.1 10*3/uL (ref 0.7–4.0)
LYMPHS PCT: 32 %
MCH: 31.1 pg (ref 26.0–34.0)
MCHC: 33 g/dL (ref 30.0–36.0)
MCV: 94.2 fL (ref 78.0–100.0)
MONO ABS: 0.3 10*3/uL (ref 0.1–1.0)
MONOS PCT: 5 %
NEUTROS ABS: 4.1 10*3/uL (ref 1.7–7.7)
Neutrophils Relative %: 60 %
PLATELETS: 304 10*3/uL (ref 150–400)
RBC: 3.47 MIL/uL — ABNORMAL LOW (ref 3.87–5.11)
RDW: 14.7 % (ref 11.5–15.5)
WBC: 6.7 10*3/uL (ref 4.0–10.5)

## 2017-10-03 LAB — TYPE AND SCREEN
ABO/RH(D): A NEG
ANTIBODY SCREEN: NEGATIVE
Unit division: 0

## 2017-10-03 LAB — COMPREHENSIVE METABOLIC PANEL
ALT: 10 U/L — ABNORMAL LOW (ref 14–54)
AST: 21 U/L (ref 15–41)
Albumin: 2.7 g/dL — ABNORMAL LOW (ref 3.5–5.0)
Alkaline Phosphatase: 38 U/L (ref 38–126)
Anion gap: 11 (ref 5–15)
BILIRUBIN TOTAL: 1.4 mg/dL — AB (ref 0.3–1.2)
BUN: 24 mg/dL — AB (ref 6–20)
CO2: 22 mmol/L (ref 22–32)
CREATININE: 0.71 mg/dL (ref 0.44–1.00)
Calcium: 8.8 mg/dL — ABNORMAL LOW (ref 8.9–10.3)
Chloride: 107 mmol/L (ref 101–111)
GFR calc Af Amer: >60 mL/min (ref >60)
Glucose, Bld: 80 mg/dL (ref 65–99)
POTASSIUM: 3.5 mmol/L (ref 3.5–5.1)
Sodium: 140 mmol/L (ref 135–145)
TOTAL PROTEIN: 6.2 g/dL — AB (ref 6.5–8.1)

## 2017-10-03 LAB — BPAM RBC
BLOOD PRODUCT EXPIRATION DATE: 201903282359
ISSUE DATE / TIME: 201903140734
Unit Type and Rh: 600

## 2017-10-03 MED ORDER — FERROUS SULFATE 325 (65 FE) MG PO TABS
325.0000 mg | ORAL_TABLET | Freq: Every day | ORAL | Status: DC
  Administered 2017-10-04 – 2017-10-06 (×2): 325 mg via ORAL
  Filled 2017-10-03 (×3): qty 1

## 2017-10-03 MED ORDER — HYDROCORTISONE 2.5 % RE CREA
TOPICAL_CREAM | Freq: Two times a day (BID) | RECTAL | Status: DC
  Administered 2017-10-03 – 2017-10-06 (×6): via TOPICAL
  Filled 2017-10-03 (×2): qty 28.35

## 2017-10-03 MED ORDER — CALCIUM CARBONATE-VITAMIN D 500-200 MG-UNIT PO TABS
1.0000 | ORAL_TABLET | Freq: Every day | ORAL | Status: DC
  Administered 2017-10-04 – 2017-10-06 (×2): 1 via ORAL
  Filled 2017-10-03 (×3): qty 1

## 2017-10-03 MED ORDER — METOPROLOL SUCCINATE ER 25 MG PO TB24
25.0000 mg | ORAL_TABLET | Freq: Every day | ORAL | Status: DC
  Administered 2017-10-03 – 2017-10-06 (×3): 25 mg via ORAL
  Filled 2017-10-03 (×4): qty 1

## 2017-10-03 NOTE — Consult Note (Signed)
[facility] CM Primary Care Navigator  10/03/2017  Jill Short Jan 22, 1931 [id number]   Met withpatient at the bedsideto identify possible discharge needs. Patient reports having "large amount of bright red blood in stool" that resulted to this admission.  PatientendorsesDr. Charlene Brooke Nche with [place] [facility] as the primary care provider.   Patientshared [facility] on [address] to obtainmedications without any problem.   Patientverbalized managingherownmedications at [employer] out of the containerswithout difficulty.  Her daughter Jill Short) has been driving and providingtransportation to herdoctors'appointments.  [place] husband (Ed) at home. She mentioned that both her husband and daughter are herprimary caregivers after discharge.   Anticipated discharge plan ishomeaccording topatient.  Patient voiced understanding to call primary care provider's office whenshe returns home for a post discharge follow-up appointment within1- 2 weeksor sooner if needs arise.Patient letter (with PCP's contact number) was provided asareminder.  Explained topatient regardingTH[facility] CM services available for health managementat homebutshe denies any needs or concerns at this point. Patient expressedunderstandingto seek referral from primary care provider to [facility] care management ifdeemed necessary and appropriate for anyservices in the future.  [facility] care management information provided for future needs thatshemay have.  Patienthowever, verbally agreedand optedforEMMIcalls tofollow-upwithrecoveryat home.   Referral made for [facility] General calls after discharge.   For additional questions please contact:  Jill Short Needs, BSN, RN-BC [facility] PRIMARY CARE Navigator Cell: [phone]

## 2017-10-03 NOTE — Progress Notes (Signed)
Subjective: No blood. No pain. Hungry.  Objective: Vital signs in last 24 hours: Temp:  [97.8 F (36.6 C)-98.6 F (37 C)] 97.8 F (36.6 C) (03/15 0917) Pulse Rate:  [70-97] 70 (03/15 0917) Resp:  [13-23] 15 (03/15 0917) BP: (115-153)/(55-90) 134/71 (03/15 0917) SpO2:  [95 %-98 %] 95 % (03/15 0917) Weight:  [129 lb 3 oz (58.6 kg)] 129 lb 3 oz (58.6 kg) (03/15 0700) Weight change: 4 lb 3 oz (1.9 kg) Last BM Date: 10/02/17  PE: GEN:  NAD ABD:  Soft, non-tender  Lab Results: CBC    Component Value Date/Time   WBC 6.7 10/03/2017 0516   RBC 3.47 (L) 10/03/2017 0516   HGB 10.8 (L) 10/03/2017 0516   HGB 10.5 (L) 08/20/2016 1624   HCT 32.7 (L) 10/03/2017 0516   HCT 33.9 (L) 08/20/2016 1624   PLT 304 10/03/2017 0516   PLT 475 (H) 08/20/2016 1624   MCV 94.2 10/03/2017 0516   MCV 94 08/20/2016 1624   MCH 31.1 10/03/2017 0516   MCHC 33.0 10/03/2017 0516   RDW 14.7 10/03/2017 0516   RDW 14.0 08/20/2016 1624   LYMPHSABS 2.1 10/03/2017 0516   LYMPHSABS 2.4 05/16/2016 1628   MONOABS 0.3 10/03/2017 0516   EOSABS 0.2 10/03/2017 0516   EOSABS 0.1 05/16/2016 1628   BASOSABS 0.0 10/03/2017 0516   BASOSABS 0.0 05/16/2016 1628   Assessment:  1.  Blood in stool, black (on iron) versus red (patient not sure).  Prior similar (per patient) work-up in 2016 with EGD and colonoscopy, felt to have had diverticular bleeding. 2.  Anemia, mild. 3.  Multiple other medical problems.  Plan:  1.  Start diet. 2.  Daily CBC would suffice. 3.  If no bleeding today, consider discharge home tomorrow. 4.  [facility] will follow.  Landry Dyke 10/03/2017, 10:13 AM   Cell [phone] If no answer or after 5 PM call [phone]

## 2017-10-03 NOTE — Progress Notes (Signed)
Hospitalist progress note   Jill Short  JQB:[id number] DOB: Jul 17, 1931 DOA: 10/02/2017 PCP: Flossie Buffy, NP   Specialists:   Brief Narrative:   84 y/o ? Known extensive diverticulosis with LGIB-not great candidate for surgical management of the same Chr Afib [place] > 3 prior on eliquis on PPM--patient is currently on 81 mg of aspirin htn Dm ty ii prior on metformin  Presents with dark and tarry stool since early this morning  3/14 has had 2 episodes Has not been taking any over-the-counter nonsteroidals does not drink alcohol and has had no overt illnesses recently No abdominal pain, some mild dizziness Note that patient has been on iron-hemoglobin down from 13.3 last checked 07/11/2017   Assessment & Plan:   Assessment:  The encounter diagnosis was Rectal bleeding.  ?  GI bleed-patient has known extensive diverticulosis-we will monitor and observe empirically-GI has been consulted and do not feel any specific intervention is necessary at this stage Have discontinued Protonix GTT placed on U twice daily Protonix likely changed to oral in the morning Cut back normal saline 100 cc/h-->50 cc/H. will obviously hold off on aspirin.  It is unlikely there is any surgical indication at this time  Chronic atrial fibrillation status post PPM [place] 2016 (bradycardia) chads score >3-patient is normotensive with slightly low systolic blood pressures so we will hold off on home dose metoprolol 25 daily and then once overnight no distress   Hyperlipidemia holding Lofibra 200 a.m.  B12 deficiency continue B12 p.o. daily  Osteoporosis continue vitamin D and cholecalciferol once seen by GI   DVT prophylaxis: SCD given active GI bleed code Status:   Full code   family Communication:   No family present currently Disposition Plan: Inpatient change to telemetry later today if stabilized gastroenterology   Consultants:   Gastroenterology  Procedures:   None  Antimicrobials:    None  Subjective: Awake aler had some bright red stool but also does have hemorrhoids   Objective: Vitals:   10/03/17 0653 10/03/17 0700 10/03/17 0917 10/03/17 1320  BP:   134/71 (!) 145/110  Pulse:   70 93  Resp:   15 17  Temp: 97.8 F (36.6 C)  97.8 F (36.6 C) 98.3 F (36.8 C)  TempSrc: Oral  Oral Oral  SpO2:   95% 96%  Weight:  58.6 kg (129 lb 3 oz)    Height:        Intake/Output Summary (Last 24 hours) at 10/03/2017 1536 Last data filed at 10/03/2017 1324 Gross per 24 hour  Intake 1578.34 ml  Output 700 ml  Net 878.34 ml   Filed Weights   10/02/17 0256 10/03/17 0700  Weight: 56.7 kg (125 lb) 58.6 kg (129 lb 3 oz)    Examination: EOMI NCAT mild pallor no icterus Awake pleasant oriented No icterus no pallor S1-S2 no murmur Abdomen soft no rebound no guarding Rectal exam shows 6:00 hemorrhoid not bleeding at present   Data Reviewed: I have personally reviewed following labs and imaging studies  CBC: Recent Labs  Lab 10/02/17 0301 10/02/17 0541 10/02/17 1515 10/02/17 2054 10/03/17 0516  WBC 8.1  --   --   --  6.7  NEUTROABS  --   --   --   --  4.1  HGB 11.8* 11.2* 10.7* 10.5* 10.8*  HCT 35.3* 34.6* 32.7* 32.5* 32.7*  MCV 95.9  --   --   --  94.2  PLT 338  --   --   --  585   Basic Metabolic Panel: Recent Labs  Lab 10/02/17 0301 10/03/17 0516  NA 137 140  K 4.6 3.5  CL 103 107  CO2 24 22  GLUCOSE 101* 80  BUN 35* 24*  CREATININE 0.88 0.71  CALCIUM 9.2 8.8*   GFR: Estimated Creatinine Clearance: 42.3 mL/min (by C-G formula based on SCr of 0.71 mg/dL). Liver Function Tests: Recent Labs  Lab 10/02/17 0301 10/03/17 0516  AST 25 21  ALT 12* 10*  ALKPHOS 43 38  BILITOT 0.5 1.4*  PROT 7.2 6.2*  ALBUMIN 3.4* 2.7*   No results for input(s): LIPASE, AMYLASE in the last 168 hours. No results for input(s): AMMONIA in the last 168 hours. Coagulation Profile: No results for input(s): INR, PROTIME in the last 168 hours. Cardiac  Enzymes: No results for input(s): CKTOTAL, CKMB, CKMBINDEX, TROPONINI in the last 168 hours. CBG: No results for input(s): GLUCAP in the last 168 hours. Urine analysis:    Component Value Date/Time   COLORURINE DARK YELLOW 07/11/2017 1549   APPEARANCEUR TURBID (A) 07/11/2017 1549   LABSPEC 1.018 07/11/2017 1549   PHURINE 6.5 07/11/2017 1549   GLUCOSEU NEGATIVE 07/11/2017 1549   HGBUR NEGATIVE 07/11/2017 1549   KETONESUR NEGATIVE 07/11/2017 1549   PROTEINUR NEGATIVE 07/11/2017 1549     Radiology Studies: Reviewed images personally in health database    Scheduled Meds: . [START ON 10/04/2017] calcium-vitamin D  1 tablet Oral Q breakfast  . [START ON 10/04/2017] ferrous sulfate  325 mg Oral Q breakfast  . metoprolol succinate  25 mg Oral Daily  . [START ON 10/05/2017] pantoprazole  40 mg Intravenous Q12H  . sodium chloride flush  3 mL Intravenous Q12H   Continuous Infusions: . pantoprozole (PROTONIX) infusion 8 mg/hr (10/03/17 0831)     LOS: 1 day    Time spent: no charge    Verneita Griffes, MD [facility] ([phone]   If 7PM-7AM, please contact night-coverage [email or web address] Password [email or web address] 10/03/2017, 3:36 PM

## 2017-10-04 ENCOUNTER — Inpatient Hospital Stay (HOSPITAL_COMMUNITY): Payer: Medicare Other

## 2017-10-04 LAB — CBC WITH DIFFERENTIAL/PLATELET
Basophils Absolute: 0 10*3/uL (ref 0.0–0.1)
Basophils Relative: 0 %
EOS ABS: 0.1 10*3/uL (ref 0.0–0.7)
EOS PCT: 1 %
HCT: 33.8 % — ABNORMAL LOW (ref 36.0–46.0)
Hemoglobin: 11.3 g/dL — ABNORMAL LOW (ref 12.0–15.0)
LYMPHS ABS: 1.6 10*3/uL (ref 0.7–4.0)
Lymphocytes Relative: 12 %
MCH: 32 pg (ref 26.0–34.0)
MCHC: 33.4 g/dL (ref 30.0–36.0)
MCV: 95.8 fL (ref 78.0–100.0)
MONOS PCT: 5 %
Monocytes Absolute: 0.7 10*3/uL (ref 0.1–1.0)
Neutro Abs: 10.7 10*3/uL — ABNORMAL HIGH (ref 1.7–7.7)
Neutrophils Relative %: 82 %
PLATELETS: 323 10*3/uL (ref 150–400)
RBC: 3.53 MIL/uL — ABNORMAL LOW (ref 3.87–5.11)
RDW: 14.4 % (ref 11.5–15.5)
WBC: 13.2 10*3/uL — ABNORMAL HIGH (ref 4.0–10.5)

## 2017-10-04 LAB — BASIC METABOLIC PANEL
Anion gap: 10 (ref 5–15)
BUN: 20 mg/dL (ref 6–20)
CALCIUM: 9 mg/dL (ref 8.9–10.3)
CHLORIDE: 103 mmol/L (ref 101–111)
CO2: 25 mmol/L (ref 22–32)
CREATININE: 0.84 mg/dL (ref 0.44–1.00)
GFR calc Af Amer: >60 mL/min (ref >60)
GFR calc non Af Amer: >60 mL/min (ref >60)
Glucose, Bld: 110 mg/dL — ABNORMAL HIGH (ref 65–99)
Potassium: 3 mmol/L — ABNORMAL LOW (ref 3.5–5.1)
SODIUM: 138 mmol/L (ref 135–145)

## 2017-10-04 MED ORDER — HYDROCORTISONE 2.5 % RE CREA: TOPICAL_CREAM | Freq: Two times a day (BID) | RECTAL | 0 refills | Status: AC

## 2017-10-04 MED ORDER — POTASSIUM CHLORIDE CRYS ER 20 MEQ PO TBCR
40.0000 meq | EXTENDED_RELEASE_TABLET | Freq: Every day | ORAL | Status: DC
  Administered 2017-10-04 – 2017-10-06 (×2): 40 meq via ORAL
  Filled 2017-10-04 (×3): qty 2

## 2017-10-04 MED ORDER — TRAMADOL HCL 50 MG PO TABS
50.0000 mg | ORAL_TABLET | Freq: Four times a day (QID) | ORAL | Status: DC
  Administered 2017-10-04 – 2017-10-05 (×4): 50 mg via ORAL
  Filled 2017-10-04 (×4): qty 1

## 2017-10-04 NOTE — Progress Notes (Signed)
Hospitalist progress note   Jill Short  ZOX:[id number] DOB: Jul 30, 1930 DOA: 10/02/2017 PCP: Flossie Buffy, NP   Specialists:   Brief Narrative:   84 y/o ? Known extensive diverticulosis with LGIB-not great candidate for surgical management of the same Chr Afib [place] > 3 prior on eliquis on PPM--patient is currently on 81 mg of aspirin htn Dm ty ii prior on metformin  Presents with dark and tarry stool since early this morning  3/14 has had 2 episodes Has not been taking any over-the-counter nonsteroidals does not drink alcohol and has had no overt illnesses recently No abdominal pain, some mild dizziness Note that patient has been on iron-hemoglobin down from 13.3 last checked 07/11/2017   Assessment & Plan:   Assessment:  The primary encounter diagnosis was Rectal bleeding. A diagnosis of Fracture was also pertinent to this visit.  ?  GI bleed-patient has known extensive diverticulosis-we will monitor and observe empirically-GI has been consulted and do not feel any specific intervention is necessary at this stage Have discontinued Protonix GTT placed on U twice daily Protonix likely changed to oral in the morning Cut back normal saline 100 cc/h-->50 cc/H. will obviously hold off on aspirin.  It is unlikely there is any surgical indication at this time Has had dark stool today but has hemorrhoids and do not think that this is diverticula  LLE foot pain-xray shows advanced arthritis-cannot bear weight Adding tramadol Might need snf  Chronic atrial fibrillation status post PPM [place] 2016 (bradycardia) chads score >3-patient is normotensive with slightly low systolic blood pressures so we will hold off on home dose metoprolol 25 daily and then once overnight no distress  Hyperlipidemia holding Lofibra 200 a.m.  B12 deficiency continue B12 p.o. daily  Osteoporosis continue vitamin D and cholecalciferol once seen by GI   DVT prophylaxis: SCD given active GI bleed code  Status:   Full code   family Communication:   No family present currently Disposition Plan: Inpatient change to telemetry later today if stabilized gastroenterology   Consultants:   Gastroenterology  Procedures:   None  Antimicrobials:   None  Subjective: Awake aler had some bright red stool but also does have hemorrhoids No fever no cp Severe pain in lle    Objective: Vitals:   10/04/17 1218 10/04/17 1321 10/04/17 1441 10/04/17 1646  BP:  (!) 89/53 (!) 87/54   Pulse:      Resp:  19 (!) 24   Temp: 99.3 F (37.4 C)   97.9 F (36.6 C)  TempSrc: Axillary   Oral  SpO2:      Weight:      Height:        Intake/Output Summary (Last 24 hours) at 10/04/2017 1713 Last data filed at 10/04/2017 8119 Gross per 24 hour  Intake 143.75 ml  Output 600 ml  Net -456.25 ml   Filed Weights   10/02/17 0256 10/03/17 0700 10/04/17 0435  Weight: 56.7 kg (125 lb) 58.6 kg (129 lb 3 oz) 57.3 kg (126 lb 5.2 oz)    Examination: EOMI NCAT mild pallor no icterus Awake pleasant oriented No icterus no pallor S1-S2 no murmur Abdomen soft no rebound no guarding LLE foot tender over forefoot and plantar area without swelling point tenderness noted   Data Reviewed: I have personally reviewed following labs and imaging studies  CBC: Recent Labs  Lab 10/02/17 0301 10/02/17 0541 10/02/17 1515 10/02/17 2054 10/03/17 0516 10/04/17 1002  WBC 8.1  --   --   --  6.7 13.2*  NEUTROABS  --   --   --   --  4.1 10.7*  HGB 11.8* 11.2* 10.7* 10.5* 10.8* 11.3*  HCT 35.3* 34.6* 32.7* 32.5* 32.7* 33.8*  MCV 95.9  --   --   --  94.2 95.8  PLT 338  --   --   --  304 016   Basic Metabolic Panel: Recent Labs  Lab 10/02/17 0301 10/03/17 0516 10/04/17 0618  NA 137 140 138  K 4.6 3.5 3.0*  CL 103 107 103  CO2 24 22 25   GLUCOSE 101* 80 110*  BUN 35* 24* 20  CREATININE 0.88 0.71 0.84  CALCIUM 9.2 8.8* 9.0   GFR: Estimated Creatinine Clearance: 36.9 mL/min (by C-G formula based on SCr of  0.84 mg/dL). Liver Function Tests: Recent Labs  Lab 10/02/17 0301 10/03/17 0516  AST 25 21  ALT 12* 10*  ALKPHOS 43 38  BILITOT 0.5 1.4*  PROT 7.2 6.2*  ALBUMIN 3.4* 2.7*   No results for input(s): LIPASE, AMYLASE in the last 168 hours. No results for input(s): AMMONIA in the last 168 hours. Coagulation Profile: No results for input(s): INR, PROTIME in the last 168 hours. Cardiac Enzymes: No results for input(s): CKTOTAL, CKMB, CKMBINDEX, TROPONINI in the last 168 hours. CBG: No results for input(s): GLUCAP in the last 168 hours. Urine analysis:    Component Value Date/Time   COLORURINE DARK YELLOW 07/11/2017 1549   APPEARANCEUR TURBID (A) 07/11/2017 1549   LABSPEC 1.018 07/11/2017 1549   PHURINE 6.5 07/11/2017 1549   GLUCOSEU NEGATIVE 07/11/2017 1549   HGBUR NEGATIVE 07/11/2017 1549   KETONESUR NEGATIVE 07/11/2017 1549   PROTEINUR NEGATIVE 07/11/2017 1549     Radiology Studies: Reviewed images personally in health database    Scheduled Meds: . calcium-vitamin D  1 tablet Oral Q breakfast  . ferrous sulfate  325 mg Oral Q breakfast  . hydrocortisone   Topical BID  . metoprolol succinate  25 mg Oral Daily  . [START ON 10/05/2017] pantoprazole  40 mg Intravenous Q12H  . potassium chloride  40 mEq Oral Daily  . sodium chloride flush  3 mL Intravenous Q12H  . traMADol  50 mg Oral Q6H   Continuous Infusions:    LOS: 2 days    Time spent:25    Verneita Griffes, MD [facility] ([phone]   If 7PM-7AM, please contact night-coverage [email or web address] Password [email or web address] 10/04/2017, 5:13 PM

## 2017-10-04 NOTE — Evaluation (Addendum)
Physical Therapy Evaluation Patient Details Name: Jill Short MRN: [id number] DOB: 08/17/1930 Today's Date: 10/04/2017   History of Present Illness  Patient is a 84 y/o female who presents with rectal bleeding. PMH includes A-fib with pacemaker, HTN, DM, HLD.  Clinical Impression  Patient presents with left ankle pain, hypotension and impaired mobility s/p above. Requires Mod A for standing today as pt reluctant to place weight through left foot. Only able to take a few steps along side bed with Min A for support secondary to left foot pain with WB. Left foot xray- OA. Also with low BP but asymptomatic. Supine BP 92/53, sitting BP 93/59, sitting post standing BP 98/61. Pt independent PTA. Pt lives with elderly spouse who is not able to provide assist. At this time, pt is not able to return home due to inability to ambulate and not functioning even close to baseline. Would be a good candidate for [facility], if eligible. If not, may require SNF to maximize independence and mobility. Will follow acutely.      Follow Up Recommendations Other (comment)(Home first)    Equipment Recommendations  None recommended by PT    Recommendations for Other Services OT consult     Precautions / Restrictions Precautions Precautions: Fall Precaution Comments: left ankle pain Restrictions Weight Bearing Restrictions: No      Mobility  Bed Mobility Overal bed mobility: Needs Assistance Bed Mobility: Supine to Sit;Sit to Supine     Supine to sit: Min guard;HOB elevated Sit to supine: Mod assist;HOB elevated   General bed mobility comments: Able to get to EOB using rail and no assist; requires assist with LEs to bring to return to supine.   Transfers Overall transfer level: Needs assistance Equipment used: Rolling walker (2 wheeled) Transfers: Sit to/from Stand Sit to Stand: Mod assist         General transfer comment: Assist to power to standing with pt reluctant to place weight  through LLE due to pain in foot.   Ambulation/Gait Ambulation/Gait assistance: Min assist Ambulation Distance (Feet): 3 Feet Assistive device: Rolling walker (2 wheeled) Gait Pattern/deviations: Step-to pattern;Trunk flexed;Decreased stance time - left;Decreased weight shift to left;Shuffle Gait velocity: decreased   General Gait Details: Able to take a few steps along side bed with min A and cues for technique and WB through [place]. Limited by pain. 2/4 DOE.   Stairs            Wheelchair Mobility    Modified Rankin (Stroke Patients Only)       Balance Overall balance assessment: Needs assistance Sitting-balance support: Feet supported;No upper extremity supported Sitting balance-Leahy Scale: Fair     Standing balance support: During functional activity;Bilateral upper extremity supported Standing balance-Leahy Scale: Poor Standing balance comment: Reliant on BUEs for support in standing.                              Pertinent Vitals/Pain Pain Assessment: Faces Faces Pain Scale: Hurts whole lot Pain Location: left ankle Pain Descriptors / Indicators: Sore;Aching;Discomfort;Guarding;Grimacing;Tender Pain Intervention(s): Monitored during session;Repositioned;Limited activity within patient's tolerance    Home Living Family/patient expects to be discharged to:: Private residence Living Arrangements: Spouse/significant other Available Help at Discharge: Family;Available 24 hours/day Type of Home: Mobile home Home Access: Stairs to enter Entrance Stairs-Rails: Right Entrance Stairs-Number of Steps: 3 Home Layout: One level Home Equipment: Walker - 2 wheels;Kasandra Knudsen - single point      Prior Function  Level of Independence: Independent         Comments: Drives. Does cooking/cleaning.     Hand Dominance   Dominant Hand: Right    Extremity/Trunk Assessment   Upper Extremity Assessment Upper Extremity Assessment: Defer to OT evaluation    Lower  Extremity Assessment Lower Extremity Assessment: LLE deficits/detail;Generalized weakness LLE Deficits / Details: Limited AROM DF/PF secondary to pain.  LLE: Unable to fully assess due to pain LLE Sensation: WNL LLE Coordination: WNL    Cervical / Trunk Assessment Cervical / Trunk Assessment: Kyphotic  Communication   Communication: No difficulties  Cognition Arousal/Alertness: Awake/alert Behavior During Therapy: WFL for tasks assessed/performed Overall Cognitive Status: No family/caregiver present to determine baseline cognitive functioning                                 General Comments: A&O x4. Reporting left ankle pain which does not seem to make sense based on reported mechanism of injury?? Not formally assessed.      General Comments General comments (skin integrity, edema, etc.): Supine BP 92/53, sitting BP 93/59, sitting post standing BP 98/61.    Exercises     Assessment/Plan    PT Assessment Patient needs continued PT services  PT Problem List Decreased strength;Decreased mobility;Decreased range of motion;Decreased activity tolerance;Decreased cognition;Pain;Decreased balance       PT Treatment Interventions DME instruction;Functional mobility training;Balance training;Patient/family education;Therapeutic activities;Gait training;Therapeutic exercise    PT Goals (Current goals can be found in the Care Plan section)  Acute Rehab PT Goals Patient Stated Goal: to make this foot pain go away PT Goal Formulation: With patient Time For Goal Achievement: 10/18/17 Potential to Achieve Goals: Fair    Frequency Min 3X/week   Barriers to discharge Decreased caregiver support spouse not able to provide physical assist    Co-evaluation               AM-PAC PT "6 Clicks" Daily Activity  Outcome Measure Difficulty turning over in bed (including adjusting bedclothes, sheets and blankets)?: None Difficulty moving from lying on back to sitting on  the side of the bed? : Unable Difficulty sitting down on and standing up from a chair with arms (e.g., wheelchair, bedside commode, etc,.)?: Unable Help needed moving to and from a bed to chair (including a wheelchair)?: A Lot Help needed walking in hospital room?: Total Help needed climbing 3-5 steps with a railing? : Total 6 Click Score: 10    End of Session Equipment Utilized During Treatment: Gait belt Activity Tolerance: Patient limited by pain Patient left: in bed;with call bell/phone within reach;with bed alarm set Nurse Communication: Mobility status;Other (comment)(foot pain) PT Visit Diagnosis: Difficulty in walking, not elsewhere classified (R26.2);Pain;Muscle weakness (generalized) (M62.81) Pain - Right/Left: Left Pain - part of body: Ankle and joints of foot    Time: 9242-6834 PT Time Calculation (min) (ACUTE ONLY): 26 min   Charges:   PT Evaluation $PT Eval Low Complexity: 1 Low PT Treatments $Therapeutic Activity: 8-22 mins   PT G Codes:        Wray Kearns, PT, DPT [phone]    Marguarite Arbour A Rosilyn Coachman 10/04/2017, 3:24 PM

## 2017-10-05 LAB — BASIC METABOLIC PANEL
ANION GAP: 9 (ref 5–15)
BUN: 17 mg/dL (ref 6–20)
CHLORIDE: 103 mmol/L (ref 101–111)
CO2: 22 mmol/L (ref 22–32)
CREATININE: 0.62 mg/dL (ref 0.44–1.00)
Calcium: 8.6 mg/dL — ABNORMAL LOW (ref 8.9–10.3)
GFR calc non Af Amer: >60 mL/min (ref >60)
Glucose, Bld: 111 mg/dL — ABNORMAL HIGH (ref 65–99)
POTASSIUM: 3.3 mmol/L — AB (ref 3.5–5.1)
Sodium: 134 mmol/L — ABNORMAL LOW (ref 135–145)

## 2017-10-05 MED ORDER — TRAMADOL HCL 50 MG PO TABS: 50.0000 mg | ORAL_TABLET | Freq: Four times a day (QID) | ORAL | 0 refills | Status: DC

## 2017-10-05 NOTE — Evaluation (Addendum)
Occupational Therapy Evaluation Patient Details Name: ADRIENNE TROMBETTA MRN: [id number] DOB: January 18, 1931 Today's Date: 10/05/2017    History of Present Illness Patient is a 84 y/o female who presents with rectal bleeding. PMH includes A-fib with pacemaker, HTN, DM, HLD.   Clinical Impression   This 84 yo female admitted with above presents to acute OT with lethargy and LLE pain thus affecting her PLOF of being totally independent with basic ADLs and also doing some IADLs. She will benefit from acute OT with follow up OT below depending on A at home.    Follow Up Recommendations  Other (comment)(home instead if pt qualifies and has adequate support when they are not there; othewise SNF)    Equipment Recommendations  3 in 1 bedside commode       Precautions / Restrictions Precautions Precautions: Fall Precaution Comments: left ankle pain (swollen, twice as big as other ankle but not discolored)--xray negative Restrictions Weight Bearing Restrictions: No      Mobility Bed Mobility Overal bed mobility: Needs Assistance Bed Mobility: Supine to Sit     Supine to sit: Supervision;HOB elevated(use of rail, coming out on right side)        Transfers Overall transfer level: Needs assistance Equipment used: Rolling walker (2 wheeled) Transfers: Sit to/from [facility] Sit to Stand: Min assist         General transfer comment: intially reluctant to place enough weight on LLE to take first step, but with time she was able to but painful    Balance Overall balance assessment: Needs assistance Sitting-balance support: No upper extremity supported;Feet supported Sitting balance-Leahy Scale: Good     Standing balance support: During functional activity;Bilateral upper extremity supported Standing balance-Leahy Scale: Poor Standing balance comment: Reliant on BUEs for support in standing due to right ankle pain                           ADL either  performed or assessed with clinical judgement   ADL Overall ADL's : Needs assistance/impaired Eating/Feeding: Supervision/ safety;Set up Eating/Feeding Details (indicate cue type and reason): supported sitting Grooming: Set up;Supervision/safety Grooming Details (indicate cue type and reason): supported sitting Upper Body Bathing: Set up;Supervision/ safety Upper Body Bathing Details (indicate cue type and reason): supported sitting Lower Body Bathing: Moderate assistance Lower Body Bathing Details (indicate cue type and reason): min A sit<>stand Upper Body Dressing : Minimal assistance Upper Body Dressing Details (indicate cue type and reason): supported sitting Lower Body Dressing: Maximal assistance Lower Body Dressing Details (indicate cue type and reason): min A sit<>stand Toilet Transfer: RW Toilet Transfer Details (indicate cue type and reason): Mod A for first step (painful left ankle) then min A for next two steps Toileting- Clothing Manipulation and Hygiene: Moderate assistance Toileting - Clothing Manipulation Details (indicate cue type and reason): min A sit<>stand             Vision Patient Visual Report: No change from baseline Additional Comments: Kept eyes closed most of session due to lethargy            Pertinent Vitals/Pain Pain Assessment: Faces Faces Pain Scale: Hurts even more Pain Location: left ankle Pain Descriptors / Indicators: Tightness;Sore Pain Intervention(s): Limited activity within patient's tolerance;Monitored during session;Repositioned     Hand Dominance Right   Extremity/Trunk Assessment Upper Extremity Assessment Upper Extremity Assessment: Generalized weakness   Lower Extremity Assessment Lower Extremity Assessment: LLE deficits/detail LLE Deficits / Details: swollen ankle (  twice as big as right ankle)       Communication Communication Communication: (very sleepy)   Cognition Arousal/Alertness: Lethargic Behavior During  Therapy: Flat affect Overall Cognitive Status: Within Functional Limits for tasks assessed                                 General Comments: Pt will awaken briefly to answer questions, mobilize, and follows commands; however she cannot stay awake between these tasks and reports she knows she is having trouble staying awake              [place] expects to be discharged to:: Private residence Living Arrangements: Spouse/significant other;Children Available Help at Discharge: Family;Available 24 hours/day Type of Home: (motor home next to dtr's home) Home Access: Stairs to enter Entrance Stairs-Number of Steps: 3 Entrance Stairs-Rails: Right Home Layout: One level     Bathroom Shower/Tub: Occupational psychologist: [place]: Environmental consultant - 2 wheels;Cane - single point   Additional Comments: Pt reports she and husband live with her dtr who is retired IT trainer      Prior Functioning/Environment Level of Independence: Independent        Comments: Drives. Does cooking/cleaning.        OT Problem List: Decreased strength;Decreased activity tolerance;Impaired balance (sitting and/or standing);Pain;Decreased safety awareness;Decreased knowledge of use of DME or AE      OT Treatment/Interventions: Self-care/ADL training;Balance training;DME and/or AE instruction;Patient/family education    OT Goals(Current goals can be found in the care plan section) Acute Rehab OT Goals Patient Stated Goal: to be able to wake up and stay awake OT Goal Formulation: With patient Time For Goal Achievement: 10/19/17 Potential to Achieve Goals: Good ADL Goals Pt Will Perform Grooming: with min guard assist;standing Pt Will Perform Lower Body Dressing: with min guard assist;sit to/from stand Pt Will Transfer to Toilet: with min guard assist;ambulating;bedside commode  OT Frequency: Min 2X/week              AM-PAC PT "6 Clicks"  Daily Activity     Outcome Measure Help from another person eating meals?: A Little Help from another person taking care of personal grooming?: A Little Help from another person toileting, which includes using toliet, bedpan, or urinal?: A Lot Help from another person bathing (including washing, rinsing, drying)?: A Lot Help from another person to put on and taking off regular upper body clothing?: A Little Help from another person to put on and taking off regular lower body clothing?: A Lot 6 Click Score: 15   End of Session Equipment Utilized During Treatment: Gait belt;Rolling walker Nurse Communication: (mobility status to RN and NT of +1 min except for 1st step pt was Mod A; charge RN pt would benefit from left ankle splint, vital signs remained stable with BP actually rising with gettting up (per RN pt has not had her meds this morning due to her drowsiness); pt c/o nausea with movement and had dry heaves without emesis)  Activity Tolerance: Patient limited by lethargy;Patient limited by pain Patient left: in chair;with call bell/phone within reach;with chair alarm set  OT Visit Diagnosis: Unsteadiness on feet (R26.81);Other abnormalities of gait and mobility (R26.89);Muscle weakness (generalized) (M62.81);Pain Pain - Right/Left: Left Pain - part of body: Ankle and joints of foot                Time: [phone]  OT Time Calculation (min): 41 min Charges:  OT General Charges $OT Visit: 1 Visit OT Evaluation $OT Eval Moderate Complexity: 1 Mod OT Treatments $Self Care/Home Management : 23-37 mins [place], OTR/L [phone] 10/05/2017

## 2017-10-05 NOTE — Progress Notes (Signed)
[facility] PROGRESS NOTE Subjective Pt had negative foot xray. States stools still black.patient thinks that she is still bleeding but she is currently taking iron.hemoglobin has been increasing up to 11.3.G.I. Bleeding scan negative  Objective: Vital signs in last 24 hours: Temp:  [97.9 F (36.6 C)-99.3 F (37.4 C)] 99.3 F (37.4 C) (03/17 0358) Pulse Rate:  [70-78] 70 (03/17 0400) Resp:  [10-27] 18 (03/17 0400) BP: (87-116)/(53-90) 110/55 (03/17 0400) SpO2:  [95 %-97 %] 95 % (03/17 0400) Weight:  [57.1 kg (125 lb 14.1 oz)] 57.1 kg (125 lb 14.1 oz) (03/17 0558) Last BM Date: 10/04/17  Intake/Output from previous day: 03/16 0701 - 03/17 0700 In: -  Out: 400 [Urine:400] Intake/Output this shift: No intake/output data recorded.  PE: General--talkative no distress Heart--slightly irregular few PVCs on monitor Lungs--clear Abdomen--soft and nontender  Lab Results: Recent Labs    10/02/17 1515 10/02/17 2054 10/03/17 0516 10/04/17 1002  WBC  --   --  6.7 13.2*  HGB 10.7* 10.5* 10.8* 11.3*  HCT 32.7* 32.5* 32.7* 33.8*  PLT  --   --  304 323   BMET Recent Labs    10/03/17 0516 10/04/17 0618 10/05/17 0517  NA 140 138 134*  K 3.5 3.0* 3.3*  CL 107 103 103  CO2 22 25 22   CREATININE 0.71 0.84 0.62   LFT Recent Labs    10/03/17 0516  PROT 6.2*  AST 21  ALT 10*  ALKPHOS 38  BILITOT 1.4*   PT/INR No results for input(s): LABPROT, INR in the last 72 hours. PANCREAS No results for input(s): LIPASE in the last 72 hours.       Studies/Results: Dg Foot Complete Left  Result Date: 10/04/2017 CLINICAL DATA:  Left foot pain after injury. EXAM: LEFT FOOT - COMPLETE 3+ VIEW COMPARISON:  None. FINDINGS: No acute fracture or dislocation. Mild hallux valgus deformity with mild first MTP joint osteoarthritis. Severe osteoarthritis of the second tarsometatarsal joint. Hammertoe deformities of the second through fifth digits. Diffuse osteopenia. Vascular  calcifications. IMPRESSION: 1.  No acute osseous abnormality. Electronically Signed   By: Titus Dubin M.D.   On: 10/04/2017 12:23    Medications: I have reviewed the patient's current medications.  Assessment:   1. Blood in stool.possibly was diverticular appears to have stopped. Her stool dark but she is on iron and her hemoglobin increasing. She may have had a diverticular bleed but it seems to have stopped. Had similar presentation several years ago. Her anemia seems to be resolving with oral iron. 2. Multiple other medical problems  Plan: In view of her stability, negative bleeding scan and rising hemoglobin I would not recommend any further diagnostic testing at this time. Would treat this as if it were a diverticular bleed and discharge her own Miralax. She can follow up with Dr. Paulita Fujita in 2 to 3 weeks to determine if she still is having bleeding.   Jill Short 10/05/2017, 7:21 AM  This note was created using voice recognition software. Minor errors may Have occurred unintentionally.  Pager: [phone] If no answer or after hours call [phone]

## 2017-10-05 NOTE — Care Management Note (Signed)
Case Management Note Marvetta Gibbons RN, BSN Unit 4E-Case Manager--- 5W weekend coverage [phone]  Patient Details  Name: Jill Short MRN: [id number] Date of Birth: 05-22-1931  Subjective/Objective:  Pt admitted with GIB                   Action/Plan: PTA Pt lived at home with family- stays in motor home- on daughter's property - per daughter she is only "steps" away. Pt for discharge home today- however on arrival to room daughter at bedside and patient very lethargic- daughter states "this is not her norm" MD arrives to bedside also- will monitor for the day. Discussed HH needs with daughter who states she is an EMT and feels she can manage her mom's care without [place]- she is available 24/7 and PCP is also very close by. At this time daughter politely declines [place] services. Informed daughter that if she changes mind prior to discharge to let staff know- CM can still arrange [place] follow up for discharge.   Expected Discharge Date:  10/05/17               Expected Discharge Plan:  [place]  In-House Referral:     Discharge planning Services  CM Consult  Post Acute Care Choice:  Home Health Choice offered to:  Adult Children  DME Arranged:    DME Agency:     HH Arranged:  RN, PT, Patient Refused [place] Agency:     Status of Service:  Completed, signed off  If discussed at [place] of Stay Meetings, dates discussed:    Additional Comments:  Dawayne Patricia, RN 10/05/2017, 3:15 PM

## 2017-10-05 NOTE — Discharge Summary (Signed)
Physician Discharge Summary  Jill Short ZOX:[id number] DOB: Feb 20, 1931 DOA: 10/02/2017  PCP: Flossie Buffy, NP  Admit date: 10/02/2017 Discharge date: 10/05/2017  Time spent:35 minutes  Recommendations for Outpatient Follow-up:  1. Needs cbc 1 week 2. Follow with [facility] ~ 1 -2 weeks 3. Given tramadol Rx this admit-follow pain levels and refer to ortho or sports med if no better-do not Rx NSAIDS--Has hemorrhoids also Rx Anusol this admit-might need re-eval for banding? 4. Asa d/c this admit--main compelling indication is Afib--would resume as per discretion GI--held on d/c   Discharge Diagnoses:  Principal Problem:   Acute GI bleeding Active Problems:   Chronic GI bleeding   Paroxysmal atrial fibrillation (HCC)   Coronary artery disease   Discharge Condition: good  Diet recommendation: hh low salt dm  Filed Weights   10/03/17 0700 10/04/17 0435 10/05/17 0558  Weight: 58.6 kg (129 lb 3 oz) 57.3 kg (126 lb 5.2 oz) 57.1 kg (125 lb 14.1 oz)    History of present illness:   84 y/o ? Known extensive diverticulosis with LGIB-not great candidate for surgical management of the same Chr Afib [place] > 3 prior on eliquis on PPM--patient is currently on 81 mg of aspirin htn Dm ty ii prior on metformin  Presents with dark and tarry stool since early this morning  3/14 has had 2 episodes Has not been taking any over-the-counter nonsteroidals does not drink alcohol and has had no overt illnesses recently No abdominal pain, some mild dizziness Note that patient has been on iron-hemoglobin down from 13.3 last checked 07/11/2017    Hospital Course:   GI bleed-patient has known extensive diverticulosis-we will monitor and observe empirically-GI has been consulted and do not feel any specific intervention is necessary at this stage  twice daily Protonix likely changed to oral in the morning Has had dark stool today but has hemorrhoids and do not think that this is diverticula SHe  was cleared by GI for d/c--her hemoglobin at last check was 11  LLE foot pain-xray shows advanced arthritis-cannot bear weight Adding tramadol Might need snf--patient has elderly husband at home and declines the saem-asking for therpay /home first at home and given tramadol limited rx on d/c home  Chronic atrial fibrillation status post PPM [place] 2016 (bradycardia) chads score >3-patient is held on admit but resumed home dose metoprolol 25 daily  Hyperlipidemia holding Lofibra 200 a.m.  B12 deficiency continue B12 p.o. daily  Osteoporosis continue vitamin D and cholecalciferol once seen by GI    Procedures:  noen (i.e. Studies not automatically included, echos, thoracentesis, etc; not x-rays)  Consultations:   GI  Discharge Exam: Vitals:   10/05/17 0358 10/05/17 0400  BP:  (!) 110/55  Pulse:  70  Resp:  18  Temp: 99.3 F (37.4 C)   SpO2:  95%    General: awake alert no distress tol diet no cp Cardiovascular:  s1 s2 no m/r/g-paced on tele Respiratory: clear no added sound abd soft nt nd  LLE ROM intact, no point tender to the foot,   Discharge Instructions   Discharge Instructions    Diet - low sodium heart healthy   Complete by:  As directed    Diet - low sodium heart healthy   Complete by:  As directed    Discharge instructions   Complete by:  As directed    No aspirin for now please discuss with your primary care physician in about 1 week regarding resumption of the same we  will prescribe some Anusol cream which will help with bleeding from possibly hemorrhoids I would recommend the blood count in about 1-2 weeks   Increase activity slowly   Complete by:  As directed    Increase activity slowly   Complete by:  As directed      Allergies as of 10/05/2017   No Known Allergies     Medication List    STOP taking these medications   aspirin EC 81 MG tablet     TAKE these medications   B-12 PO Take 1 tablet by mouth daily.   calcium-vitamin D  500-200 MG-UNIT tablet Commonly known as:  OSCAL WITH D Take 1 tablet by mouth daily with breakfast.   cholecalciferol 1000 units tablet Commonly known as:  VITAMIN D Take 1,000 Units by mouth daily.   fenofibrate micronized 200 MG capsule Commonly known as:  LOFIBRA Take 200 mg by mouth daily before breakfast.   ferrous sulfate 325 (65 FE) MG tablet Take 1 tablet (325 mg total) by mouth daily with breakfast.   FLAX PO Take 1,200 mg by mouth daily.   hydrocortisone 2.5 % rectal cream Commonly known as:  ANUSOL-HC Apply topically 2 (two) times daily.   metoprolol succinate 25 MG 24 hr tablet Commonly known as:  TOPROL-XL Take 1 tablet (25 mg total) by mouth daily.   pantoprazole 40 MG tablet Commonly known as:  PROTONIX TAKE ONE TABLET BY MOUTH DAILY   polycarbophil 625 MG tablet Commonly known as:  FIBERCON Take 1 tablet (625 mg total) by mouth daily.   polyethylene glycol powder powder Commonly known as:  GLYCOLAX/MIRALAX Take 17 g by mouth daily as needed. What changed:  reasons to take this   potassium chloride 10 MEQ tablet Commonly known as:  K-DUR Take 2 tablets (20 mEq total) by mouth daily.   traMADol 50 MG tablet Commonly known as:  ULTRAM Take 1 tablet (50 mg total) by mouth every 6 (six) hours.   vitamin C 500 MG tablet Commonly known as:  ASCORBIC ACID Take 500 mg by mouth daily.      No Known Allergies    The results of significant diagnostics from this hospitalization (including imaging, microbiology, ancillary and laboratory) are listed below for reference.    Significant Diagnostic Studies: Dg Foot Complete Left  Result Date: 10/04/2017 CLINICAL DATA:  Left foot pain after injury. EXAM: LEFT FOOT - COMPLETE 3+ VIEW COMPARISON:  None. FINDINGS: No acute fracture or dislocation. Mild hallux valgus deformity with mild first MTP joint osteoarthritis. Severe osteoarthritis of the second tarsometatarsal joint. Hammertoe deformities of the second  through fifth digits. Diffuse osteopenia. Vascular calcifications. IMPRESSION: 1.  No acute osseous abnormality. Electronically Signed   By: Titus Dubin M.D.   On: 10/04/2017 12:23    Microbiology: No results found for this or any previous visit (from the past 240 hour(s)).   Labs: Basic Metabolic Panel: Recent Labs  Lab 10/02/17 0301 10/03/17 0516 10/04/17 0618 10/05/17 0517  NA 137 140 138 134*  K 4.6 3.5 3.0* 3.3*  CL 103 107 103 103  CO2 24 22 25 22   GLUCOSE 101* 80 110* 111*  BUN 35* 24* 20 17  CREATININE 0.88 0.71 0.84 0.62  CALCIUM 9.2 8.8* 9.0 8.6*   Liver Function Tests: Recent Labs  Lab 10/02/17 0301 10/03/17 0516  AST 25 21  ALT 12* 10*  ALKPHOS 43 38  BILITOT 0.5 1.4*  PROT 7.2 6.2*  ALBUMIN 3.4* 2.7*   No  results for input(s): LIPASE, AMYLASE in the last 168 hours. No results for input(s): AMMONIA in the last 168 hours. CBC: Recent Labs  Lab 10/02/17 0301 10/02/17 0541 10/02/17 1515 10/02/17 2054 10/03/17 0516 10/04/17 1002  WBC 8.1  --   --   --  6.7 13.2*  NEUTROABS  --   --   --   --  4.1 10.7*  HGB 11.8* 11.2* 10.7* 10.5* 10.8* 11.3*  HCT 35.3* 34.6* 32.7* 32.5* 32.7* 33.8*  MCV 95.9  --   --   --  94.2 95.8  PLT 338  --   --   --  304 323   Cardiac Enzymes: No results for input(s): CKTOTAL, CKMB, CKMBINDEX, TROPONINI in the last 168 hours. BNP: BNP (last 3 results) No results for input(s): BNP in the last 8760 hours.  ProBNP (last 3 results) No results for input(s): PROBNP in the last 8760 hours.  CBG: No results for input(s): GLUCAP in the last 168 hours.     Signed:  Nita Sells MD   [facility] 10/05/2017, 8:21 AM

## 2017-10-05 NOTE — Progress Notes (Addendum)
Patient experiencing nausea this morning. States she just does not feel well. Very sleepy. Did not want to eat breakfast. Took her dentures out but does not remember doing so. VS WNL. Denies any pain. Does not seem herself. Daughter called and expressed concerns about the same, wanting me to check on her because she did not sound right. Latest VS BP 132/71. HR 70 Resp 16. Sp02 99 on RA. Have not given AM meds yet as patient is too sleepy. Gave some ginger ale and she took a couple of swallows. Will continue to monitor a while before discharge. 10:32 Daughter here is is concerned that something is not right with her mom. Paged Dr. Verlon Au about concerns. 11:12

## 2017-10-06 LAB — COMPREHENSIVE METABOLIC PANEL
ALT: 10 U/L — AB (ref 14–54)
AST: 19 U/L (ref 15–41)
Albumin: 2.4 g/dL — ABNORMAL LOW (ref 3.5–5.0)
Alkaline Phosphatase: 46 U/L (ref 38–126)
Anion gap: 8 (ref 5–15)
BILIRUBIN TOTAL: 0.8 mg/dL (ref 0.3–1.2)
BUN: 13 mg/dL (ref 6–20)
CHLORIDE: 101 mmol/L (ref 101–111)
CO2: 26 mmol/L (ref 22–32)
CREATININE: 0.61 mg/dL (ref 0.44–1.00)
Calcium: 8.8 mg/dL — ABNORMAL LOW (ref 8.9–10.3)
GFR calc Af Amer: >60 mL/min (ref >60)
Glucose, Bld: 103 mg/dL — ABNORMAL HIGH (ref 65–99)
Potassium: 3.6 mmol/L (ref 3.5–5.1)
Sodium: 135 mmol/L (ref 135–145)
TOTAL PROTEIN: 6.3 g/dL — AB (ref 6.5–8.1)

## 2017-10-06 LAB — CBC WITH DIFFERENTIAL/PLATELET
BASOS ABS: 0 10*3/uL (ref 0.0–0.1)
Basophils Relative: 0 %
Eosinophils Absolute: 0.2 10*3/uL (ref 0.0–0.7)
Eosinophils Relative: 1 %
HEMATOCRIT: 29.9 % — AB (ref 36.0–46.0)
HEMOGLOBIN: 9.7 g/dL — AB (ref 12.0–15.0)
LYMPHS PCT: 13 %
Lymphs Abs: 1.7 10*3/uL (ref 0.7–4.0)
MCH: 30.9 pg (ref 26.0–34.0)
MCHC: 32.4 g/dL (ref 30.0–36.0)
MCV: 95.2 fL (ref 78.0–100.0)
Monocytes Absolute: 1.2 10*3/uL — ABNORMAL HIGH (ref 0.1–1.0)
Monocytes Relative: 9 %
NEUTROS ABS: 10.3 10*3/uL — AB (ref 1.7–7.7)
Neutrophils Relative %: 77 %
Platelets: 327 10*3/uL (ref 150–400)
RBC: 3.14 MIL/uL — AB (ref 3.87–5.11)
RDW: 14 % (ref 11.5–15.5)
WBC: 13.3 10*3/uL — AB (ref 4.0–10.5)

## 2017-10-06 NOTE — Progress Notes (Signed)
Looks well-no changes to d/c summary dated 3/17---stable for d/c home and nursing aware  Verneita Griffes, MD [facility] [phone]

## 2017-10-06 NOTE — Progress Notes (Signed)
Occupational Therapy Treatment Patient Details Name: Jill Short MRN: [id number] DOB: 16-Nov-1930 Today's Date: 10/06/2017    History of present illness Patient is a 84 y/o female who presents with rectal bleeding. PMH includes A-fib with pacemaker, HTN, DM, HLD.   OT comments  Pt doing better this day- pain decreased.    Follow Up Recommendations  Home health OT(home instead if pt qualifies and has adequate support when they are not there; othewise SNF)    Equipment Recommendations  3 in 1 bedside commode    Recommendations for Other Services      Precautions / Restrictions Precautions Precautions: Fall Precaution Comments: left ankle pain (swollen, twice as big as other ankle but not discolored)--xray negative Restrictions Weight Bearing Restrictions: No       Mobility Bed Mobility Overal bed mobility: Needs Assistance Bed Mobility: Supine to Sit;Sit to Supine     Supine to sit: Supervision;HOB elevated(use of rail, coming out on right side) Sit to supine: Supervision      Transfers Overall transfer level: Needs assistance Equipment used: Rolling walker (2 wheeled) Transfers: Sit to/from [facility] Sit to Stand: Min assist Stand pivot transfers: Min assist            Balance Overall balance assessment: Needs assistance Sitting-balance support: No upper extremity supported;Feet supported Sitting balance-Leahy Scale: Good     Standing balance support: During functional activity;Bilateral upper extremity supported Standing balance-Leahy Scale: Poor Standing balance comment: Reliant on BUEs for support in standing due to right ankle pain                           ADL either performed or assessed with clinical judgement   ADL Overall ADL's : Needs assistance/impaired     Grooming: Set up;Supervision/safety                   Toilet Transfer: Minimal assistance;RW;BSC   Toileting- Clothing Manipulation and  Hygiene: Minimal assistance;Sit to/from stand;Cueing for safety;Cueing for sequencing         General ADL Comments: pt reports daughter and husband will A her.  OT reccomends HH but pt declines     Vision Patient Visual Report: No change from baseline            Cognition Arousal/Alertness: Awake/alert                                                         Pertinent Vitals/ Pain       Pain Assessment: 0-10 Pain Score: 3  Pain Location: left ankle Pain Descriptors / Indicators: Sore Pain Intervention(s): Monitored during session         Frequency  Min 2X/week        Progress Toward Goals  OT Goals(current goals can now be found in the care plan section)  Progress towards OT goals: Progressing toward goals  Acute Rehab OT Goals OT Goal Formulation: With patient  Plan Discharge plan remains appropriate    Co-evaluation                 AM-PAC PT "6 Clicks" Daily Activity     Outcome Measure   Help from another person eating meals?: None Help from another person taking care of personal grooming?: A Little Help  from another person toileting, which includes using toliet, bedpan, or urinal?: A Little Help from another person bathing (including washing, rinsing, drying)?: A Little Help from another person to put on and taking off regular upper body clothing?: A Little Help from another person to put on and taking off regular lower body clothing?: A Little 6 Click Score: 19    End of Session Equipment Utilized During Treatment: Rolling walker  OT Visit Diagnosis: Unsteadiness on feet (R26.81);Other abnormalities of gait and mobility (R26.89);Muscle weakness (generalized) (M62.81);Pain Pain - Right/Left: Left Pain - part of body: Ankle and joints of foot   Activity Tolerance Patient limited by lethargy;Patient limited by pain   Patient Left in chair;with call bell/phone within reach;with chair alarm set   Nurse Communication  Mobility status(mobility status to RN and NT of +1 min except for 1st step pt was Mod A; charge RN pt would benefit from left ankle splint, vital signs remained stable with BP actually rising with gettting up (per RN pt has not had her meds this morning due to her drows)        Time: 1275-1700 OT Time Calculation (min): 17 min  Charges: OT General Charges $OT Visit: 1 Visit OT Treatments $Self Care/Home Management : 8-22 mins  [place], [place]   Payton Mccallum D 10/06/2017, 10:16 AM

## 2017-10-06 NOTE — Progress Notes (Signed)
Physical Therapy Treatment Patient Details Name: Jill Short MRN: [id number] DOB: 12-17-1930 Today's Date: 10/06/2017    History of Present Illness Patient is a 84 y/o female who presents with rectal bleeding. PMH includes A-fib with pacemaker, HTN, DM, HLD.    PT Comments    Patient much improved today, reporting no ankle pain. Progressing well towards PT goals. Tolerated gait training with RW and min guard assist for safety. No DOE but difficult to get Sp02 reading secondary to poor wave. Excited and eager to return home. Will have support from daughter.  Will follow if still in the hospital.    Follow Up Recommendations  Home health PT;Supervision - Intermittent     Equipment Recommendations  None recommended by PT    Recommendations for Other Services       Precautions / Restrictions Precautions Precautions: Fall Precaution Comments:  Restrictions Weight Bearing Restrictions: No    Mobility  Bed Mobility Overal bed mobility: Needs Assistance Bed Mobility: Supine to Sit     Supine to sit: Supervision;HOB elevated Sit to supine: Supervision   General bed mobility comments: No assist needed, use of rail.  Transfers Overall transfer level: Needs assistance Equipment used: Rolling walker (2 wheeled) Transfers: Sit to/from Stand Sit to Stand: Min assist Stand pivot transfers: Min guard       General transfer comment: Min guard to steady in standing. Transferred to chair post ambulation.  Ambulation/Gait Ambulation/Gait assistance: Min guard Ambulation Distance (Feet): 150 Feet Assistive device: Rolling walker (2 wheeled) Gait Pattern/deviations: Step-through pattern;Decreased stride length;Trunk flexed Gait velocity: decreased   General Gait Details: Slow, steady gait with RW for support. Difficult to get accurate Sp02 reading due to poor wave. No DOE. HR stable.    Stairs            Wheelchair Mobility    Modified Rankin (Stroke Patients  Only)       Balance Overall balance assessment: Needs assistance Sitting-balance support: Feet supported;No upper extremity supported Sitting balance-Leahy Scale: Good     Standing balance support: During functional activity Standing balance-Leahy Scale: Fair Standing balance comment: Requires UE support for dynamic tasks at this time.                            Cognition Arousal/Alertness: Awake/alert Behavior During Therapy: WFL for tasks assessed/performed Overall Cognitive Status: Within Functional Limits for tasks assessed                                 General Comments: Seems returned to cognitive baseline today.      Exercises      General Comments        Pertinent Vitals/Pain Pain Assessment: No/denies pain Pain Score: 3  Pain Location: left ankle Pain Descriptors / Indicators: Sore Pain Intervention(s): Monitored during session    Home Living                      Prior Function            PT Goals (current goals can now be found in the care plan section) Progress towards PT goals: Progressing toward goals    Frequency    Min 3X/week      PT Plan Discharge plan needs to be updated    Co-evaluation  AM-PAC PT "6 Clicks" Daily Activity  Outcome Measure  Difficulty turning over in bed (including adjusting bedclothes, sheets and blankets)?: None Difficulty moving from lying on back to sitting on the side of the bed? : None Difficulty sitting down on and standing up from a chair with arms (e.g., wheelchair, bedside commode, etc,.)?: None Help needed moving to and from a bed to chair (including a wheelchair)?: None Help needed walking in hospital room?: A Little Help needed climbing 3-5 steps with a railing? : A Little 6 Click Score: 22    End of Session Equipment Utilized During Treatment: Gait belt Activity Tolerance: Patient tolerated treatment well Patient left: in chair;with chair  alarm set;with call bell/phone within reach Nurse Communication: Mobility status PT Visit Diagnosis: Difficulty in walking, not elsewhere classified (R26.2);Muscle weakness (generalized) (M62.81)     Time: 1117-3567 PT Time Calculation (min) (ACUTE ONLY): 18 min  Charges:  $Gait Training: 8-22 mins                    G Codes:       Wray Kearns, PT, DPT [phone]     [place] 10/06/2017, 11:41 AM

## 2017-10-06 NOTE — Progress Notes (Signed)
Patient discharge teaching given, including activity, diet, follow-up appoints, and medications. Patient verbalized understanding of all discharge instructions. IV access was d/c'd. Vitals are stable. Skin is intact except as charted in most recent assessments. Pt to be escorted out by NT, to be driven home by family.  Waiting for daughter to pick patient up.

## 2017-10-06 NOTE — Care Management Important Message (Signed)
Important Message  Patient Details  Name: Jill Short MRN: [id number] Date of Birth: Jun 14, 1931   Medicare Important Message Given:  Yes    Laden Fieldhouse 10/06/2017, 1:24 PM

## 2017-10-07 ENCOUNTER — Telehealth: Payer: Self-pay

## 2017-10-07 NOTE — Telephone Encounter (Signed)
CN-I was unable to change the visit to reflect as a Hospital F/U but the visit note does state that/thx dmf   Copied from [place]. Topic: Appointment Scheduling - Scheduling Inquiry for Clinic >> Oct 07, 2017  1:51 PM Marin Olp L wrote: Reason for CRM: Jill Short one note says 3 hosp f/u per session 6 total, however, her template in Epic only allows 1 in the afternoon session. I've scheduled this patient in an office visit 30 mins for hosp f/u on tomorrow, 10/08/2017, and put "hospital f/u " in appt notes. Pleaase fix Jill Short's template in Epic to match one note or vice versa.

## 2017-10-08 ENCOUNTER — Ambulatory Visit (INDEPENDENT_AMBULATORY_CARE_PROVIDER_SITE_OTHER): Payer: Medicare Other | Admitting: Nurse Practitioner

## 2017-10-08 ENCOUNTER — Encounter: Payer: Self-pay | Admitting: Nurse Practitioner

## 2017-10-08 ENCOUNTER — Other Ambulatory Visit: Payer: Self-pay

## 2017-10-08 ENCOUNTER — Encounter: Payer: Self-pay | Admitting: Physician Assistant

## 2017-10-08 VITALS — BP 118/60 | HR 62 | Temp 98.1°F | Ht 61.0 in | Wt 126.6 lb

## 2017-10-08 DIAGNOSIS — K922 Gastrointestinal hemorrhage, unspecified: Secondary | ICD-10-CM | POA: Diagnosis not present

## 2017-10-08 MED ORDER — PANTOPRAZOLE SODIUM 20 MG PO TBEC: 20.0000 mg | DELAYED_RELEASE_TABLET | Freq: Every day | ORAL | 1 refills | Status: DC

## 2017-10-08 NOTE — Patient Outreach (Signed)
[facility]) Care Management  10/08/2017  BRYSTAL KILDOW 08-27-1930 [id number]   EMMI- General Discharge RED ON EMMI ALERT Day # 1 Date: 10/07/17 Red Alert Reason:  Scheduled Follow up appt? No Unfilled prescriptions? Yes  Telephone call to patient for EMMI red alert.  No answer.  HIPAA compliant voice message left.  Plan: RN CM will send letter and attempt patient again within 4 business days.   Jone Baseman, RN, MSN [facility] Care Management Care Management Coordinator Direct Line [phone] Toll Free: [phone]  Fax: [phone]

## 2017-10-08 NOTE — Telephone Encounter (Signed)
I have only one hospital f/up slot per day, so another slot should not be created or used unless it is approved by me

## 2017-10-08 NOTE — Patient Instructions (Addendum)
Ok to use hydrocortisone cream once a day to avoid BM at bedtime.  You will be contacted to schedule appt with GI.

## 2017-10-08 NOTE — Progress Notes (Signed)
Subjective:  Patient ID: Jill Short, female    DOB: 1931-05-11  Age: 84 y.o. MRN: [id number]  CC: Hospitalization Follow-up (ER for GI bleeding/ med consult:cream,tramadol,zofran---reaction to zofran--not move)  HPI Accompanied by daughter. Jill Short was hospitalized 3/14-3/14/19 due to acute on chronic GI bleed possibly secondary to extensive diverticulosis and hemorrhoid. PRBC transfused. ASA was discontinued. started on protonix Today she denies any nausea or ABD pain or hematochezia or constipation or diarrhea. Has persistent tarry stools Daughter report poor appetite and oral hydration. Reports increased BM with application of rectal cream for hemorrhoids She will like GI referral.  Tramadol was prescribed for leg pain during hospitalization, which has since resolved. Did not use tramadol given.  Reviewed lab report from recent hospital visit.  Outpatient Medications Prior to Visit  Medication Sig Dispense Refill  . calcium-vitamin D (OSCAL WITH D) 500-200 MG-UNIT tablet Take 1 tablet by mouth daily with breakfast.    . cholecalciferol (VITAMIN D) 1000 UNITS tablet Take 1,000 Units by mouth daily.    . Cyanocobalamin (B-12 PO) Take 1 tablet by mouth daily.    . fenofibrate micronized (LOFIBRA) 200 MG capsule Take 200 mg by mouth daily before breakfast.    . ferrous sulfate 325 (65 FE) MG tablet Take 1 tablet (325 mg total) by mouth daily with breakfast. 90 tablet 1  . Flaxseed, Linseed, (FLAX PO) Take 1,200 mg by mouth daily.    . hydrocortisone (ANUSOL-HC) 2.5 % rectal cream Apply topically 2 (two) times daily. 30 g 0  . polycarbophil (FIBERCON) 625 MG tablet Take 1 tablet (625 mg total) by mouth daily. 60 tablet 2  . polyethylene glycol powder (GLYCOLAX/MIRALAX) powder Take 17 g by mouth daily as needed. (Patient taking differently: Take 17 g by mouth daily as needed for mild constipation. ) 850 g 11  . potassium chloride (K-DUR) 10 MEQ tablet Take 2 tablets (20 mEq  total) by mouth daily. 180 tablet 3  . vitamin C (ASCORBIC ACID) 500 MG tablet Take 500 mg by mouth daily.    . metoprolol succinate (TOPROL-XL) 25 MG 24 hr tablet Take 1 tablet (25 mg total) by mouth daily. 90 tablet 3  . pantoprazole (PROTONIX) 40 MG tablet TAKE ONE TABLET BY MOUTH DAILY 30 tablet 11  . traMADol (ULTRAM) 50 MG tablet Take 1 tablet (50 mg total) by mouth every 6 (six) hours. (Patient not taking: Reported on 10/08/2017) 30 tablet 0   No facility-administered medications prior to visit.     ROS See HPI  Objective:  BP 118/60 (BP Location: Right Arm, Patient Position: Sitting, Cuff Size: Normal)   Pulse 62   Temp 98.1 F (36.7 C) (Oral)   Ht 5\' 1"  (1.549 m)   Wt 126 lb 9.6 oz (57.4 kg)   SpO2 100%   BMI 23.92 kg/m   BP Readings from Last 3 Encounters:  10/10/17 114/60  10/08/17 118/60  10/06/17 (!) 100/58    Wt Readings from Last 3 Encounters:  10/10/17 127 lb (57.6 kg)  10/08/17 126 lb 9.6 oz (57.4 kg)  10/05/17 125 lb 14.1 oz (57.1 kg)    Physical Exam  Constitutional: She is oriented to person, place, and time. No distress.  Cardiovascular: Normal rate and normal heart sounds.  Pulmonary/Chest: Effort normal and breath sounds normal.  Musculoskeletal: She exhibits no edema.  Neurological: She is alert and oriented to person, place, and time.  Skin: Skin is warm and dry.  Psychiatric: She has a normal mood  and affect. Her behavior is normal.  Vitals reviewed.  Lab Results  Component Value Date   WBC 13.3 (H) 10/06/2017   HGB 9.7 (L) 10/06/2017   HCT 29.9 (L) 10/06/2017   PLT 327 10/06/2017   GLUCOSE 103 (H) 10/06/2017   ALT 10 (L) 10/06/2017   AST 19 10/06/2017   NA 135 10/06/2017   K 3.6 10/06/2017   CL 101 10/06/2017   CREATININE 0.61 10/06/2017   BUN 13 10/06/2017   CO2 26 10/06/2017   TSH 1.85 07/11/2017   INR 1.30 06/11/2016   HGBA1C 4.4 01/19/2015    Assessment & Plan:   Jill Short was seen today for hospitalization  follow-up.  Diagnoses and all orders for this visit:  Chronic GI bleeding -     CBC; Future -     Ambulatory referral to Gastroenterology -     pantoprazole (PROTONIX) 20 MG tablet; Take 1 tablet (20 mg total) by mouth daily.   I have discontinued Jill Short's traMADol. I have also changed her pantoprazole. Additionally, I am having her maintain her vitamin C, Cyanocobalamin (B-12 PO), (Flaxseed, Linseed, (FLAX PO)), cholecalciferol, fenofibrate micronized, polycarbophil, calcium-vitamin D, potassium chloride, polyethylene glycol powder, ferrous sulfate, and hydrocortisone.  Meds ordered this encounter  Medications  . pantoprazole (PROTONIX) 20 MG tablet    Sig: Take 1 tablet (20 mg total) by mouth daily.    Dispense:  90 tablet    Refill:  1    Order Specific Question:   Supervising Provider    Answer:   [name] [[id number]]    Follow-up: Return if symptoms worsen or fail to improve.  Wilfred Lacy, NP

## 2017-10-09 ENCOUNTER — Other Ambulatory Visit: Payer: Self-pay

## 2017-10-09 NOTE — Patient Outreach (Signed)
[facility]) Care Management  10/09/2017  Sabrina Keough Mcgillis May 13, 1931 [id number]   EMMI- General Discharge RED ON EMMI ALERT Day # 1 Date:10/07/17 Red Alert Reason: Scheduled Follow up appt? No Unfilled prescriptions? Yes  Telephone call to patient for EMMI red alert.  No answer.  HIPAA compliant voice message left.  Plan: RN CM will wait patient return call. If no response will proceed with case closure.    Jone Baseman, RN, MSN [facility] Care Management Care Management Coordinator Direct Line [phone] Toll Free: [phone]  Fax: [phone]

## 2017-10-10 ENCOUNTER — Ambulatory Visit (INDEPENDENT_AMBULATORY_CARE_PROVIDER_SITE_OTHER): Payer: Medicare Other | Admitting: Internal Medicine

## 2017-10-10 ENCOUNTER — Encounter: Payer: Self-pay | Admitting: Internal Medicine

## 2017-10-10 VITALS — BP 114/60 | HR 72 | Ht 63.0 in | Wt 127.0 lb

## 2017-10-10 DIAGNOSIS — Z950 Presence of cardiac pacemaker: Secondary | ICD-10-CM

## 2017-10-10 DIAGNOSIS — I480 Paroxysmal atrial fibrillation: Secondary | ICD-10-CM | POA: Diagnosis not present

## 2017-10-10 MED ORDER — RIVAROXABAN 15 MG PO TABS: 15.0000 mg | ORAL_TABLET | Freq: Every day | ORAL | 11 refills | Status: AC

## 2017-10-10 NOTE — Patient Instructions (Signed)
Medication Instructions:  Your physician has recommended you make the following change in your medication:   1. Stop Metoprolol Succinate (Toprol- XL) 2. Begin Xarelto 15mg , one tablet, with dinner  Labwork: None ordered.  Testing/Procedures: None ordered.  Follow-Up: Your physician recommends that you schedule a follow-up appointment in:   6 months with Dr Caryl Comes, at this time you will also have a device check.   Any Other Special Instructions Will Be Listed Below (If Applicable).     If you need a refill on your cardiac medications before your next appointment, please call your pharmacy.

## 2017-10-10 NOTE — Progress Notes (Signed)
ELECTROPHYSIOLOGY OFFICE NOTE  Patient ID: Jill Short, MRN: [id number], DOB/AGE: 03/12/1931 84 y.o. Admit date: (Not on file) Date of Consult: 10/10/2017  Primary Physician: Flossie Buffy, NP Primary Cardiologist: New to establish     Jill Short is a 84 y.o. female who is being seen today for the evaluation of pacemaker     HPI Jill Short is a 84 y.o. female with St Jude pacemaker implanted 4/16 for tachybradycardia syndrome.  The presenting symptoms are not clear but was apparently rather urgent.  She now has persistent Afib   Was treated with apixoban but notes 9/18 stopped 2/2 constipation as well as diverticular bleeding   was recommended to consider WATCHMAN  She has moderate exercise intolerance.  It is appreciated by EM-RN that her device is programmed without rate response.  She has just been discharged from the hospital because of rectal bleeding attributed to hemorrhoids.  She has bilateral peripheral edema.  Her diet is quite replete of sodium as well as fluids.   DATE TEST EF    9/17 Echo   50-55 % MR/ LAE mild  "AS mod" but AV max 2.29m/sec and maxPG 21         Thromboembolic risk factors ( age  -2, HTN-1, DM-1, Gender-1) for a CHADSVASc Score of 5    Past Medical History:  Diagnosis Date  . Acute lower GI bleeding 10/02/2017  . Arthritis    "fingers" (10/02/2017)  . Atrial fibrillation ([place])   . Coronary artery disease   . Diet-controlled type 2 diabetes mellitus ([place])   . Diverticulosis   . Hypertension   . Lower GI bleed   . Migraine    "years ago; none in the 2000s" (10/02/2017)  . Pacemaker   . Rectal bleed 07/2016      Surgical History:  Past Surgical History:  Procedure Laterality Date  . CATARACT EXTRACTION W/ INTRAOCULAR LENS  IMPLANT, BILATERAL Bilateral 06/18/2017 - 07/09/2017  . COLONOSCOPY N/A 01/06/2015   Procedure: COLONOSCOPY;  Surgeon: Laurence Spates, MD;  Location[facility] ENDOSCOPY;  Service: Endoscopy;   Laterality: N/A;  . ESOPHAGOGASTRODUODENOSCOPY N/A 01/13/2015   Procedure: ESOPHAGOGASTRODUODENOSCOPY (EGD);  Surgeon: Laurence Spates, MD;  Location: [facility] ENDOSCOPY;  Service: Endoscopy;  Laterality: N/A;  . INSERT / REPLACE / REMOVE PACEMAKER  11/2015  . JOINT REPLACEMENT    . TOTAL HIP ARTHROPLASTY Bilateral      Home Meds: Prior to Admission medications   Medication Sig Start Date End Date Taking? Authorizing Provider  calcium-vitamin D (OSCAL WITH D) 500-200 MG-UNIT tablet Take 1 tablet by mouth daily with breakfast. 06/14/16   [name], MD  cholecalciferol (VITAMIN D) 1000 UNITS tablet Take 1,000 Units by mouth daily.    [provider]  Cyanocobalamin (B-12 PO) Take 1 tablet by mouth daily.    [provider]  fenofibrate micronized (LOFIBRA) 200 MG capsule Take 200 mg by mouth daily before breakfast.    [provider]  ferrous sulfate 325 (65 FE) MG tablet Take 1 tablet (325 mg total) by mouth daily with breakfast. 09/23/17   Nche, Charlene Brooke, NP  Flaxseed, Linseed, (FLAX PO) Take 1,200 mg by mouth daily.    [provider]  hydrocortisone (ANUSOL-HC) 2.5 % rectal cream Apply topically 2 (two) times daily. 10/04/17   Nita Sells, MD  metoprolol succinate (TOPROL-XL) 25 MG 24 hr tablet Take 1 tablet (25 mg total) by mouth daily. 09/25/16   Burns, Arloa Koh,  MD  pantoprazole (PROTONIX) 20 MG tablet Take 1 tablet (20 mg total) by mouth daily. 10/08/17   Nche, Charlene Brooke, NP  polycarbophil (FIBERCON) 625 MG tablet Take 1 tablet (625 mg total) by mouth daily. 06/15/16   [name], MD  polyethylene glycol powder (GLYCOLAX/MIRALAX) powder Take 17 g by mouth daily as needed. Patient taking differently: Take 17 g by mouth daily as needed for mild constipation.  09/05/17   Nche, Charlene Brooke, NP  potassium chloride (K-DUR) 10 MEQ tablet Take 2 tablets (20 mEq total) by mouth daily. 10/16/16   Burns, Arloa Koh, MD  vitamin C (ASCORBIC ACID) 500 MG  tablet Take 500 mg by mouth daily.    [provider]    Allergies: No Known Allergies  Social History   Socioeconomic History  . Marital status: Married    Spouse name: Not on file  . Number of children: Not on file  . Years of education: Not on file  . Highest education level: Not on file  Occupational History  . Not on file  Social Needs  . Financial resource strain: Not on file  . Food insecurity:    Worry: Not on file    Inability: Not on file  . Transportation needs:    Medical: Not on file    Non-medical: Not on file  Tobacco Use  . Smoking status: Never Smoker  . Smokeless tobacco: Never Used  Substance and Sexual Activity  . Alcohol use: No  . Drug use: No  . Sexual activity: Yes  Lifestyle  . Physical activity:    Days per week: Not on file    Minutes per session: Not on file  . Stress: Not on file  Relationships  . Social connections:    Talks on phone: Not on file    Gets together: Not on file    Attends religious service: Not on file    Active member of club or organization: Not on file    Attends meetings of clubs or organizations: Not on file    Relationship status: Not on file  . Intimate partner violence:    Fear of current or ex partner: Not on file    Emotionally abused: Not on file    Physically abused: Not on file    Forced sexual activity: Not on file  Other Topics Concern  . Not on file  Social History Narrative  . Not on file     History reviewed. No pertinent family history.   ROS:  Please see the history of present illness.     All other systems reviewed and negative.    Physical Exam: Blood pressure 114/60, pulse 72, height 5\' 3"  (1.6 m), weight 127 lb (57.6 kg), SpO2 98 %. General: Well developed, well nourished female in no acute distress. Head: Normocephalic, atraumatic, sclera non-icteric, no xanthomas, nares are without discharge. EENT: normal  Lymph Nodes:  none Neck: Negative for carotid bruits. JVD not  elevated. Back:with  kyphosis  Lungs: Clear bilaterally to auscultation without wheezes, rales, or rhonchi. Breathing is unlabored. Heart: Irregularly irregular rate and rhythm with a normal S1 and S2 and a 2/6 systolic murmur . No rubs, or gallops appreciated. Abdomen: Soft, non-tender, non-distended with normoactive bowel sounds. No hepatomegaly. No rebound/guarding. No obvious abdominal masses. Msk:  Strength and tone appear normal for age. Extremities: No clubbing or cyanosis.  2+  edema.  Distal pedal pulses are 2+ and equal bilaterally. Skin: Warm and Dry Neuro: Alert .  CN III-XII intact Grossly normal sensory and motor function . Psych:  Responds to questions appropriately with a normal affect.      Labs: Cardiac Enzymes No results for input(s): CKTOTAL, CKMB, TROPONINI in the last 72 hours. CBC Lab Results  Component Value Date   WBC 13.3 (H) 10/06/2017   HGB 9.7 (L) 10/06/2017   HCT 29.9 (L) 10/06/2017   MCV 95.2 10/06/2017   PLT 327 10/06/2017   PROTIME: No results for input(s): LABPROT, INR in the last 72 hours. Chemistry  Recent Labs  Lab 10/06/17 0802  NA 135  K 3.6  CL 101  CO2 26  BUN 13  CREATININE 0.61  CALCIUM 8.8*  PROT 6.3*  BILITOT 0.8  ALKPHOS 46  ALT 10*  AST 19  GLUCOSE 103*   Lipids No results found for: CHOL, HDL, LDLCALC, TRIG BNP No results found for: PROBNP Thyroid Function Tests: No results for input(s): TSH, T4TOTAL, T3FREE, THYROIDAB in the last 72 hours.  Invalid input(s): FREET3 Miscellaneous No results found for: DDIMER  Radiology/Studies:  Dg Foot Complete Left  Result Date: 10/04/2017 CLINICAL DATA:  Left foot pain after injury. EXAM: LEFT FOOT - COMPLETE 3+ VIEW COMPARISON:  None. FINDINGS: No acute fracture or dislocation. Mild hallux valgus deformity with mild first MTP joint osteoarthritis. Severe osteoarthritis of the second tarsometatarsal joint. Hammertoe deformities of the second through fifth digits. Diffuse  osteopenia. Vascular calcifications. IMPRESSION: 1.  No acute osseous abnormality. Electronically Signed   By: Titus Dubin M.D.   On: 10/04/2017 12:23    EKG: Atrial fibrillation with ventricular pacing at 70 and occasionally conducted beat   Assessment and Plan:  Atrial fibrillation- permanent  HFpEF  Aortic Stenosis mild  Pacemaker St Jude   GI bleeding recurrent diverticular hemorrhoidal  Low blood pressure    The patient has atrial fibrillation which is now permanent.  We had a lengthy discussion regarding the balancing of GI risks and thromboembolic risks and the attendant consequences.  I am in favor of anticoagulation; certainly there is no indication for aspirin.  The family is agreeable and will begin Xarelto at 15 mg.  Review of the [facility] website identifies constipation associate with Eliquis but not associated with rivaroxaban  She has significant volume overload.  I am a little bit reluctant to add medication i.e. a diuretic, but I have reviewed with her and her daughter the importance of salt restriction and fluid restrictions  Her blood pressure is low.  We will stop  metoprolol.     Virl Axe

## 2017-10-11 LAB — CUP PACEART INCLINIC DEVICE CHECK
Battery Remaining Longevity: 128 mo
Battery Voltage: 2.99 V
Brady Statistic RA Percent Paced: 4.1 %
Date Time Interrogation Session: 20190322183448
Implantable Lead Implant Date: 20160421
Implantable Lead Location: 753860
Implantable Pulse Generator Implant Date: 20160421
Lead Channel Impedance Value: 587.5 Ohm
Lead Channel Pacing Threshold Amplitude: 0.5 V
Lead Channel Pacing Threshold Pulse Width: 0.5 ms
Lead Channel Setting Pacing Pulse Width: 0.5 ms
Lead Channel Setting Sensing Sensitivity: 2 mV
MDC IDC LEAD IMPLANT DT: 20160421
MDC IDC LEAD LOCATION: 753859
MDC IDC MSMT LEADCHNL RA IMPEDANCE VALUE: 437.5 Ohm
MDC IDC MSMT LEADCHNL RV SENSING INTR AMPL: 12 mV
MDC IDC PG SERIAL: 7730662
MDC IDC SET LEADCHNL RV PACING AMPLITUDE: 0.875
MDC IDC STAT BRADY RV PERCENT PACED: 61 %
Pulse Gen Model: 2240

## 2017-10-15 ENCOUNTER — Encounter: Payer: Self-pay | Admitting: Nurse Practitioner

## 2017-10-15 ENCOUNTER — Other Ambulatory Visit (INDEPENDENT_AMBULATORY_CARE_PROVIDER_SITE_OTHER): Payer: Medicare Other

## 2017-10-15 DIAGNOSIS — K922 Gastrointestinal hemorrhage, unspecified: Secondary | ICD-10-CM

## 2017-10-16 LAB — CBC
HCT: 33.7 % — ABNORMAL LOW (ref 36.0–46.0)
Hemoglobin: 10.9 g/dL — ABNORMAL LOW (ref 12.0–15.0)
MCHC: 32.4 g/dL (ref 30.0–36.0)
MCV: 98.3 fl (ref 78.0–100.0)
PLATELETS: 440 10*3/uL — AB (ref 150.0–400.0)
RBC: 3.43 Mil/uL — AB (ref 3.87–5.11)
RDW: 15 % (ref 11.5–15.5)
WBC: 9.5 10*3/uL (ref 4.0–10.5)

## 2017-10-22 ENCOUNTER — Other Ambulatory Visit: Payer: Self-pay

## 2017-10-22 NOTE — Patient Outreach (Signed)
[facility]) Care Management  10/22/2017  Bellamarie Pflug Kepple January 18, 1931 [id number]   Multiple attempts to establish contact with patient without success. No response from letter mailed to patient. Case is being closed at this time.    Jone Baseman, RN, MSN [facility] Care Management Care Management Coordinator Direct Line [phone] Toll Free: [phone]  Fax: [phone]

## 2017-10-23 ENCOUNTER — Ambulatory Visit (INDEPENDENT_AMBULATORY_CARE_PROVIDER_SITE_OTHER): Payer: Medicare Other | Admitting: Physician Assistant

## 2017-10-23 ENCOUNTER — Encounter: Payer: Self-pay | Admitting: Physician Assistant

## 2017-10-23 VITALS — BP 118/68 | HR 70 | Ht 63.0 in | Wt 127.0 lb

## 2017-10-23 DIAGNOSIS — D649 Anemia, unspecified: Secondary | ICD-10-CM

## 2017-10-23 DIAGNOSIS — Z8719 Personal history of other diseases of the digestive system: Secondary | ICD-10-CM

## 2017-10-23 DIAGNOSIS — K5900 Constipation, unspecified: Secondary | ICD-10-CM

## 2017-10-23 DIAGNOSIS — Z7901 Long term (current) use of anticoagulants: Secondary | ICD-10-CM | POA: Diagnosis not present

## 2017-10-23 NOTE — Progress Notes (Signed)
Chief Complaint:  History of multiple GI bleeds   HPI:    Jill Short is an 84 year old Caucasian female with a past medical history of A. fib maintained on Xarelto, who was referred to me by Nche, Charlene Brooke, NP for her history of multiple GI bleeds.      Per chart review it appears patient has always been seen by [facility] gastroenterology while admitted in the hospital, which has been a total of 5 times over the past 2 years, all for GI bleeding.  Last was 10/02/17 with black versus red, patient not sure blood in her stool.  Similar presentation in 2016 with endoscopy and colonoscopy ultimately done and bleeding felt to be diverticular.  Patient was on apixaban at that time.  At time of last admission patient was on aspirin monotherapy for antiplatelet/anticoagulant.  She had mild anemia.  It was recommend follow CBCs and have a tagged RBC study if continued with bleeding.  Eventually had this done it was negative.  It was thought her stool was dark from iron use and her anemia was resolving.  No further diagnostic testing is recommended.  She was discharged on MiraLAX.    Today, presents with daughter, they explain that they would like to establish care with [place] here.  They explain that the patient is currently not having any problems but she has had at least 3 GI bleeds over the past couple of years and has been seen in the hospital on those occasions.  These typically resolve on their own and they have been told to mixture of things between diverticular versus hemorrhoidal bleeding in the past.  Currently patient's anticoagulant is being changed to Xarelto by her cardiologist.  At this time she is only on aspirin daily but there are plans to start Xarelto in April.  Patient denies any GI complaints today.    Describes daily soft bowel movements (sometimes too soft per her daughter) on MiraLAX which she titrates herself.    Denies fever, chills, blood in her stool, melena, weight loss, anorexia,  nausea, vomiting, heartburn, reflux, change in bowel habits or symptoms that awaken her at night.  Past Medical History:  Diagnosis Date  . Acute lower GI bleeding 10/02/2017  . Arthritis    "fingers" (10/02/2017)  . Atrial fibrillation ([place])   . Coronary artery disease   . Diet-controlled type 2 diabetes mellitus ([place])   . Diverticulosis   . Hypertension   . Lower GI bleed   . Migraine    "years ago; none in the 2000s" (10/02/2017)  . Pacemaker   . Rectal bleed 07/2016    Past Surgical History:  Procedure Laterality Date  . CATARACT EXTRACTION W/ INTRAOCULAR LENS  IMPLANT, BILATERAL Bilateral 06/18/2017 - 07/09/2017  . COLONOSCOPY N/A 01/06/2015   Procedure: COLONOSCOPY;  Surgeon: Laurence Spates, MD;  Location: [facility] ENDOSCOPY;  Service: Endoscopy;  Laterality: N/A;  . ESOPHAGOGASTRODUODENOSCOPY N/A 01/13/2015   Procedure: ESOPHAGOGASTRODUODENOSCOPY (EGD);  Surgeon: Laurence Spates, MD;  Location: [facility] ENDOSCOPY;  Service: Endoscopy;  Laterality: N/A;  . INSERT / REPLACE / REMOVE PACEMAKER  11/2015  . JOINT REPLACEMENT    . TOTAL HIP ARTHROPLASTY Bilateral     Current Outpatient Medications  Medication Sig Dispense Refill  . calcium-vitamin D (OSCAL WITH D) 500-200 MG-UNIT tablet Take 1 tablet by mouth daily with breakfast.    . cholecalciferol (VITAMIN D) 1000 UNITS tablet Take 1,000 Units by mouth daily.    . Cyanocobalamin (B-12 PO) Take 1 tablet by  mouth daily.    . fenofibrate micronized (LOFIBRA) 200 MG capsule Take 200 mg by mouth daily before breakfast.    . ferrous sulfate 325 (65 FE) MG tablet Take 1 tablet (325 mg total) by mouth daily with breakfast. 90 tablet 1  . Flaxseed, Linseed, (FLAX PO) Take 1,200 mg by mouth daily.    . hydrocortisone (ANUSOL-HC) 2.5 % rectal cream Apply topically 2 (two) times daily. 30 g 0  . pantoprazole (PROTONIX) 20 MG tablet Take 1 tablet (20 mg total) by mouth daily. 90 tablet 1  . polycarbophil (FIBERCON) 625 MG tablet Take 1 tablet (625 mg  total) by mouth daily. 60 tablet 2  . polyethylene glycol powder (GLYCOLAX/MIRALAX) powder Take 17 g by mouth daily as needed. (Patient taking differently: Take 17 g by mouth daily as needed for mild constipation. ) 850 g 11  . potassium chloride (K-DUR) 10 MEQ tablet Take 2 tablets (20 mEq total) by mouth daily. 180 tablet 3  . Rivaroxaban (XARELTO) 15 MG TABS tablet Take 1 tablet (15 mg total) by mouth daily. (Patient not taking: Reported on 10/23/2017) 30 tablet 11  . vitamin C (ASCORBIC ACID) 500 MG tablet Take 500 mg by mouth daily.     No current facility-administered medications for this visit.     Allergies as of 10/23/2017  . (No Known Allergies)    Family History  Problem Relation Age of Onset  . Colon cancer Neg Hx   . Esophageal cancer Neg Hx   . Stomach cancer Neg Hx     Social History   Socioeconomic History  . Marital status: Married    Spouse name: Not on file  . Number of children: Not on file  . Years of education: Not on file  . Highest education level: Not on file  Occupational History  . Not on file  Social Needs  . Financial resource strain: Not on file  . Food insecurity:    Worry: Not on file    Inability: Not on file  . Transportation needs:    Medical: Not on file    Non-medical: Not on file  Tobacco Use  . Smoking status: Never Smoker  . Smokeless tobacco: Never Used  Substance and Sexual Activity  . Alcohol use: No  . Drug use: No  . Sexual activity: Yes  Lifestyle  . Physical activity:    Days per week: Not on file    Minutes per session: Not on file  . Stress: Not on file  Relationships  . Social connections:    Talks on phone: Not on file    Gets together: Not on file    Attends religious service: Not on file    Active member of club or organization: Not on file    Attends meetings of clubs or organizations: Not on file    Relationship status: Not on file  . Intimate partner violence:    Fear of current or ex partner: Not on file     Emotionally abused: Not on file    Physically abused: Not on file    Forced sexual activity: Not on file  Other Topics Concern  . Not on file  Social History Narrative  . Not on file    Review of Systems:    Constitutional: No weight loss, fever or chills Skin: No rash  Cardiovascular: No chest pain Respiratory: No SOB  Gastrointestinal: See HPI and otherwise negative Genitourinary: No dysuria  Neurological: No headache, dizziness or syncope  Musculoskeletal: No new muscle or joint pain Hematologic: No bleeding or bruising Psychiatric: No history of depression or anxiety   Physical Exam:  Vital signs: BP 118/68   Pulse 70   Ht 5\' 3"  (1.6 m)   Wt 127 lb (57.6 kg)   BMI 22.50 kg/m   Constitutional:   Pleasant Elderly fragile appearing Caucasian female appears to be in NAD, Well developed, Well nourished, alert and cooperative Head:  Normocephalic and atraumatic. Eyes:   PEERL, EOMI. No icterus. Conjunctiva pink. Ears:  Normal auditory acuity. Neck:  Supple Throat: Oral cavity and pharynx without inflammation, swelling or lesion.  Respiratory: Respirations even and unlabored. Lungs clear to auscultation bilaterally.   No wheezes, crackles, or rhonchi.  Cardiovascular: Normal S1, S2. No MRG. Regular rate and rhythm. No peripheral edema, cyanosis or pallor.  Gastrointestinal:  Soft, nondistended, nontender. No rebound or guarding. Normal bowel sounds. No appreciable masses or hepatomegaly. Rectal:  Not performed.  Msk:  Symmetrical without gross deformities. Without edema, no deformity or joint abnormality.  Neurologic:  Alert and  oriented x4;  grossly normal neurologically.  Skin:   Dry and intact without significant lesions or rashes. Psychiatric: Demonstrates good judgement and reason without abnormal affect or behaviors.  RELEVANT LABS AND IMAGING: CBC    Component Value Date/Time   WBC 9.5 10/15/2017 1632   RBC 3.43 (L) 10/15/2017 1632   HGB 10.9 (L) 10/15/2017  1632   HGB 10.5 (L) 08/20/2016 1624   HCT 33.7 (L) 10/15/2017 1632   HCT 33.9 (L) 08/20/2016 1624   PLT 440.0 (H) 10/15/2017 1632   PLT 475 (H) 08/20/2016 1624   MCV 98.3 10/15/2017 1632   MCV 94 08/20/2016 1624   MCH 30.9 10/06/2017 0802   MCHC 32.4 10/15/2017 1632   RDW 15.0 10/15/2017 1632   RDW 14.0 08/20/2016 1624   LYMPHSABS 1.7 10/06/2017 0802   LYMPHSABS 2.4 05/16/2016 1628   MONOABS 1.2 (H) 10/06/2017 0802   EOSABS 0.2 10/06/2017 0802   EOSABS 0.1 05/16/2016 1628   BASOSABS 0.0 10/06/2017 0802   BASOSABS 0.0 05/16/2016 1628    CMP     Component Value Date/Time   NA 135 10/06/2017 0802   NA 142 08/20/2016 1624   K 3.6 10/06/2017 0802   CL 101 10/06/2017 0802   CO2 26 10/06/2017 0802   GLUCOSE 103 (H) 10/06/2017 0802   BUN 13 10/06/2017 0802   BUN 19 08/20/2016 1624   CREATININE 0.61 10/06/2017 0802   CREATININE 0.80 07/11/2017 1611   CALCIUM 8.8 (L) 10/06/2017 0802   PROT 6.3 (L) 10/06/2017 0802   PROT 7.1 05/16/2016 1628   ALBUMIN 2.4 (L) 10/06/2017 0802   ALBUMIN 3.7 05/16/2016 1628   AST 19 10/06/2017 0802   ALT 10 (L) 10/06/2017 0802   ALKPHOS 46 10/06/2017 0802   BILITOT 0.8 10/06/2017 0802   BILITOT 0.3 05/16/2016 1628   GFRNONAA >60 10/06/2017 0802   GFRAA >60 10/06/2017 0802    Assessment: 1.  History of GI bleeding: Last EGD and colonoscopy in 2016, colonoscopy with redundant colon and severe diverticulosis throughout the entire colon, patient is typically on chronic anticoagulation for A. fib and this is thought to be related, currently no bleeding, hemoglobin increasing since last hospitalization, patient continues on oral iron 2.  Anemia: Chronic anemia with hemoglobin around 10 typically, recently dropped down to 9.6, increasing back now with oral iron daily 3.  Constipation: Does well with MiraLAX titrated to soft bowel movements 4.  Chronic  anticoagulation: Due to A. fib, will start Xarelto in April  Plan: 1.  At this time no need for  further intervention.  Patient is just wishing to establish care with [place] here.  Explained that the next time she is in the hospital they need to let the physician know that she is being seen by [facility] [place]. 2.  Patient to continue oral iron 3.  Patient to continue MiraLAX titrated to a soft solid bowel movement 4.  Patient will follow in clinic as needed in the future with myself.  She was assigned to Dr. Henrene Pastor today.  Ellouise Newer, PA-C [facility] 10/23/2017, 11:41 AM  Cc: Flossie Buffy, NP

## 2017-10-23 NOTE — Progress Notes (Signed)
Assessment and plan reviewed 

## 2017-10-27 ENCOUNTER — Telehealth: Payer: Self-pay | Admitting: Nurse Practitioner

## 2017-10-27 ENCOUNTER — Other Ambulatory Visit: Payer: Self-pay

## 2017-10-27 DIAGNOSIS — K5909 Other constipation: Secondary | ICD-10-CM

## 2017-10-27 MED ORDER — POTASSIUM CHLORIDE ER 10 MEQ PO TBCR: 20.0000 meq | EXTENDED_RELEASE_TABLET | Freq: Every day | ORAL | 0 refills | Status: DC

## 2017-10-27 NOTE — Telephone Encounter (Signed)
Pt request a refill on potassium chloride 10 MEQ. The provider that ordered the med is Martyn Malay, MD.  Provider: C. Nche LOV  10/08/17 NOV  11/24/17 Last K+ was 3.6 on 10/06/17 Pharmacy:  [facility]

## 2017-10-27 NOTE — Telephone Encounter (Signed)
Copied from [place]. Topic: Quick Communication - See Telephone Encounter >> Oct 27, 2017 10:43 AM Conception Chancy, NT wrote: CRM for notification. See Telephone encounter for: 10/27/17.  Patient is requesting a refill on potassium chloride (K-DUR) 10 MEQ tablet please advise.  [address] [place] [address] Phone: [phone] Fax: [phone]

## 2017-10-27 NOTE — Telephone Encounter (Signed)
Prescription sent

## 2017-10-28 ENCOUNTER — Ambulatory Visit (INDEPENDENT_AMBULATORY_CARE_PROVIDER_SITE_OTHER): Payer: Medicare Other | Admitting: Podiatry

## 2017-10-28 DIAGNOSIS — M79675 Pain in left toe(s): Secondary | ICD-10-CM

## 2017-10-28 DIAGNOSIS — M79674 Pain in right toe(s): Secondary | ICD-10-CM

## 2017-10-28 DIAGNOSIS — B351 Tinea unguium: Secondary | ICD-10-CM | POA: Diagnosis not present

## 2017-10-28 DIAGNOSIS — M79605 Pain in left leg: Secondary | ICD-10-CM

## 2017-10-28 DIAGNOSIS — M79604 Pain in right leg: Secondary | ICD-10-CM

## 2017-10-29 NOTE — Progress Notes (Signed)
She presents today for follow-up painful elongated toenails.  Objective: Vital signs are stable alert and oriented x3.  Pulses are palpable toenails are long thick yellow dystrophic with mycotic painful palpation.  Assessment: Pain in limb secondary to onychomycosis.  Plan: Debridement of toenails 1 through 5 bilateral.  Follow-up 3 months

## 2017-11-19 NOTE — Progress Notes (Signed)
No longer a clinic patient.  She goes to [facility].

## 2017-11-24 ENCOUNTER — Encounter: Payer: Medicare Other | Admitting: Nurse Practitioner

## 2017-11-25 NOTE — Progress Notes (Signed)
This encounter was created in error - please disregard.

## 2017-12-23 ENCOUNTER — Encounter: Payer: Self-pay | Admitting: Family Medicine

## 2017-12-23 ENCOUNTER — Ambulatory Visit (INDEPENDENT_AMBULATORY_CARE_PROVIDER_SITE_OTHER): Payer: Medicare Other | Admitting: Family Medicine

## 2017-12-23 ENCOUNTER — Ambulatory Visit (INDEPENDENT_AMBULATORY_CARE_PROVIDER_SITE_OTHER): Payer: Medicare Other

## 2017-12-23 VITALS — BP 124/80 | HR 87 | Temp 99.5°F | Ht 63.0 in

## 2017-12-23 DIAGNOSIS — L03011 Cellulitis of right finger: Secondary | ICD-10-CM

## 2017-12-23 LAB — CBC
HEMATOCRIT: 39.6 % (ref 36.0–46.0)
HEMOGLOBIN: 13.1 g/dL (ref 12.0–15.0)
MCHC: 33.1 g/dL (ref 30.0–36.0)
MCV: 93.4 fl (ref 78.0–100.0)
PLATELETS: 409 10*3/uL — AB (ref 150.0–400.0)
RBC: 4.24 Mil/uL (ref 3.87–5.11)
RDW: 13.7 % (ref 11.5–15.5)
WBC: 10.9 10*3/uL — ABNORMAL HIGH (ref 4.0–10.5)

## 2017-12-23 MED ORDER — CEPHALEXIN 500 MG PO CAPS: 500.0000 mg | ORAL_CAPSULE | Freq: Three times a day (TID) | ORAL | 0 refills | Status: AC

## 2017-12-23 NOTE — Patient Instructions (Signed)

## 2017-12-23 NOTE — Progress Notes (Signed)
Subjective:  Patient ID: Jill Short, female    DOB: May 27, 1931  Age: 84 y.o. MRN: [id number]  CC: thumb swollen   HPI [name] presents for evaluation of swelling with erythema of her right thumb.  This has evolved over the last day or 2.  There was no injury.  She is right-hand dominant.  Denies increased use at this time.  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  She has limited range of motion but denies fevers chills or streaking.  No known insect bite.  Outpatient Medications Prior to Visit  Medication Sig Dispense Refill  . calcium-vitamin D (OSCAL WITH D) 500-200 MG-UNIT tablet Take 1 tablet by mouth daily with breakfast.    . cholecalciferol (VITAMIN D) 1000 UNITS tablet Take 1,000 Units by mouth daily.    . Cyanocobalamin (B-12 PO) Take 1 tablet by mouth daily.    . fenofibrate micronized (LOFIBRA) 200 MG capsule Take 200 mg by mouth daily before breakfast.    . ferrous sulfate 325 (65 FE) MG tablet Take 1 tablet (325 mg total) by mouth daily with breakfast. 90 tablet 1  . Flaxseed, Linseed, (FLAX PO) Take 1,200 mg by mouth daily.    . hydrocortisone (ANUSOL-HC) 2.5 % rectal cream Apply topically 2 (two) times daily. 30 g 0  . pantoprazole (PROTONIX) 20 MG tablet Take 1 tablet (20 mg total) by mouth daily. 90 tablet 1  . polycarbophil (FIBERCON) 625 MG tablet Take 1 tablet (625 mg total) by mouth daily. 60 tablet 2  . polyethylene glycol powder (GLYCOLAX/MIRALAX) powder Take 17 g by mouth daily as needed. (Patient taking differently: Take 17 g by mouth daily as needed for mild constipation. ) 850 g 11  . potassium chloride (K-DUR) 10 MEQ tablet Take 2 tablets (20 mEq total) by mouth daily. 180 tablet 0  . Rivaroxaban (XARELTO) 15 MG TABS tablet Take 1 tablet (15 mg total) by mouth daily. 30 tablet 11  . vitamin C (ASCORBIC ACID) 500 MG tablet Take 500 mg by mouth daily.     No facility-administered medications prior to visit.     ROS Review of Systems    Constitutional: Negative for chills, fatigue and fever.  Respiratory: Negative.   Cardiovascular: Negative.   Gastrointestinal: Negative.   Musculoskeletal: Negative for arthralgias.  Skin: Positive for color change and rash. Negative for pallor and wound.  Allergic/Immunologic: Negative for immunocompromised state.  Hematological: Negative for adenopathy.  Psychiatric/Behavioral: Negative.     Objective:  BP 124/80   Pulse 87   Temp 99.5 F (37.5 C) (Oral)   Ht 5\' 3"  (1.6 m)   SpO2 98%   BMI 22.50 kg/m   BP Readings from Last 3 Encounters:  12/23/17 124/80  10/23/17 118/68  10/10/17 114/60    Wt Readings from Last 3 Encounters:  10/23/17 127 lb (57.6 kg)  10/10/17 127 lb (57.6 kg)  10/08/17 126 lb 9.6 oz (57.4 kg)    Physical Exam  Constitutional: She is oriented to person, place, and time. She appears well-developed and well-nourished. No distress.  HENT:  Head: Normocephalic and atraumatic.  Right Ear: External ear normal.  Left Ear: External ear normal.  Eyes: Right eye exhibits no discharge. Left eye exhibits no discharge. No scleral icterus.  Neck: No JVD present. No tracheal deviation present.  Pulmonary/Chest: Effort normal.  Neurological: She is alert and oriented to person, place, and time.  Skin: Capillary refill takes less than 2 seconds. No rash noted. She  is not diaphoretic. There is erythema. No pallor.     Psychiatric: She has a normal mood and affect. Her behavior is normal.    Lab Results  Component Value Date   WBC 9.5 10/15/2017   HGB 10.9 (L) 10/15/2017   HCT 33.7 (L) 10/15/2017   PLT 440.0 (H) 10/15/2017   GLUCOSE 103 (H) 10/06/2017   ALT 10 (L) 10/06/2017   AST 19 10/06/2017   NA 135 10/06/2017   K 3.6 10/06/2017   CL 101 10/06/2017   CREATININE 0.61 10/06/2017   BUN 13 10/06/2017   CO2 26 10/06/2017   TSH 1.85 07/11/2017   INR 1.30 06/11/2016   HGBA1C 4.4 01/19/2015    No results found.  Assessment & Plan:   Addalyn was  seen today for thumb swollen.  Diagnoses and all orders for this visit:  Cellulitis of finger of right hand -     DG Hand Complete Right; Future -     CBC -     cephALEXin (KEFLEX) 500 MG capsule; Take 1 capsule (500 mg total) by mouth 3 (three) times daily for 10 days.   I am having Sharesa A. Pizzi start on cephALEXin. I am also having her maintain her vitamin C, Cyanocobalamin (B-12 PO), (Flaxseed, Linseed, (FLAX PO)), cholecalciferol, fenofibrate micronized, polycarbophil, calcium-vitamin D, polyethylene glycol powder, ferrous sulfate, hydrocortisone, pantoprazole, Rivaroxaban, and potassium chloride.  Meds ordered this encounter  Medications  . cephALEXin (KEFLEX) 500 MG capsule    Sig: Take 1 capsule (500 mg total) by mouth 3 (three) times daily for 10 days.    Dispense:  30 capsule    Refill:  0     Follow-up: Return friday.  Libby Maw, MD

## 2017-12-26 ENCOUNTER — Encounter: Payer: Self-pay | Admitting: Family Medicine

## 2017-12-26 ENCOUNTER — Ambulatory Visit (INDEPENDENT_AMBULATORY_CARE_PROVIDER_SITE_OTHER): Payer: Medicare Other | Admitting: Family Medicine

## 2017-12-26 VITALS — BP 118/80 | HR 70 | Temp 98.2°F

## 2017-12-26 DIAGNOSIS — L03011 Cellulitis of right finger: Secondary | ICD-10-CM

## 2017-12-26 NOTE — Patient Instructions (Signed)
Complete antibiotic Follow up next week

## 2017-12-26 NOTE — Progress Notes (Signed)
Jill Short - 84 y.o. female MRN [id number]  Date of birth: [birth year]  Subjective Chief Complaint  Patient presents with  . Follow-up    swollen thumb    HPI Jill Short is a 84 y.o. female here today for follow up of cellulitis of R thumb.  Seen 3 days ago and prescribed cephalexin, didn't start until 2 days ago.  WBC count elevated at that appt.  Xrays ordered but not read yet.  Reports that thumb is a little better, redness and swelling is decreased some. Still with some pain and stiffness.   Denies fever, chills, fatigue.   ROS: ROS completed and negative except as noted per HPI   No Known Allergies  Past Medical History:  Diagnosis Date  . Acute lower GI bleeding 10/02/2017  . Arthritis    "fingers" (10/02/2017)  . Atrial fibrillation ([place])   . Coronary artery disease   . Diet-controlled type 2 diabetes mellitus ([place])   . Diverticulosis   . Hypertension   . Lower GI bleed   . Migraine    "years ago; none in the 2000s" (10/02/2017)  . Pacemaker   . Rectal bleed 07/2016    Past Surgical History:  Procedure Laterality Date  . CATARACT EXTRACTION W/ INTRAOCULAR LENS  IMPLANT, BILATERAL Bilateral 06/18/2017 - 07/09/2017  . COLONOSCOPY N/A 01/06/2015   Procedure: COLONOSCOPY;  Surgeon: Laurence Spates, MD;  Location: [facility] ENDOSCOPY;  Service: Endoscopy;  Laterality: N/A;  . ESOPHAGOGASTRODUODENOSCOPY N/A 01/13/2015   Procedure: ESOPHAGOGASTRODUODENOSCOPY (EGD);  Surgeon: Laurence Spates, MD;  Location: [facility] ENDOSCOPY;  Service: Endoscopy;  Laterality: N/A;  . INSERT / REPLACE / REMOVE PACEMAKER  11/2015  . JOINT REPLACEMENT    . TOTAL HIP ARTHROPLASTY Bilateral     Social History   Socioeconomic History  . Marital status: Married    Spouse name: Not on file  . Number of children: Not on file  . Years of education: Not on file  . Highest education level: Not on file  Occupational History  . Not on file  Social Needs  . Financial resource strain: Not on file  .  Food insecurity:    Worry: Not on file    Inability: Not on file  . Transportation needs:    Medical: Not on file    Non-medical: Not on file  Tobacco Use  . Smoking status: Never Smoker  . Smokeless tobacco: Never Used  Substance and Sexual Activity  . Alcohol use: No  . Drug use: No  . Sexual activity: Yes  Lifestyle  . Physical activity:    Days per week: Not on file    Minutes per session: Not on file  . Stress: Not on file  Relationships  . Social connections:    Talks on phone: Not on file    Gets together: Not on file    Attends religious service: Not on file    Active member of club or organization: Not on file    Attends meetings of clubs or organizations: Not on file    Relationship status: Not on file  Other Topics Concern  . Not on file  Social History Narrative  . Not on file    Family History  Problem Relation Age of Onset  . Colon cancer Neg Hx   . Esophageal cancer Neg Hx   . Stomach cancer Neg Hx     Health Maintenance  Topic Date Due  . TETANUS/TDAP  08/30/1951  . DEXA SCAN  08/29/1997  .  PNA vac Low Risk Adult (1 of 2 - PCV13) 08/29/1997  . INFLUENZA VACCINE  02/19/2018  . FOOT EXAM  Discontinued  . HEMOGLOBIN A1C  Discontinued  . OPHTHALMOLOGY EXAM  Discontinued  . URINE MICROALBUMIN  Discontinued    ----------------------------------------------------------------------------------------------------------------------------------------------------------------------------------------------------------------- Physical Exam BP 118/80   Pulse 70   Temp 98.2 F (36.8 C) (Oral)   Physical Exam  Constitutional: She is oriented to person, place, and time. She appears well-nourished.  HENT:  Head: Normocephalic and atraumatic.  Cardiovascular: Normal rate, regular rhythm and normal heart sounds.  Pulmonary/Chest: Effort normal and breath sounds normal.  Musculoskeletal:  Right thumb with swelling along MCP joint.  Mildly tender with slight  erythema.  She does have reduced ROM.  Neurological: She is alert and oriented to person, place, and time.  Psychiatric: She has a normal mood and affect. Her behavior is normal.   Previous xray reviewed by me today:  R thumb without fracture or dislocation.  Arthritic changes and small spurring noted.  Mild soft tissue swelling without foreign body.    ------------------------------------------------------------------------------------------------------------------------------------------------------------------------------------------------------------------- Assessment and Plan  Cellulitis of finger of right hand Appears to be improving, recommend completion of cephalexin Xray reviewed today Repeat CBC F/u in 5 days or sooner if worsening again.

## 2017-12-26 NOTE — Assessment & Plan Note (Signed)
Appears to be improving, recommend completion of cephalexin Xray reviewed today Repeat CBC F/u in 5 days or sooner if worsening again.

## 2017-12-27 LAB — CBC WITH DIFFERENTIAL/PLATELET
Basophils Absolute: 55 {cells}/uL (ref 0–200)
Basophils Relative: 0.5 %
Eosinophils Absolute: 44 {cells}/uL (ref 15–500)
Eosinophils Relative: 0.4 %
HCT: 38.7 % (ref 35.0–45.0)
Hemoglobin: 13 g/dL (ref 11.7–15.5)
Lymphs Abs: 2409 {cells}/uL (ref 850–3900)
MCH: 30.6 pg (ref 27.0–33.0)
MCHC: 33.6 g/dL (ref 32.0–36.0)
MCV: 91.1 fL (ref 80.0–100.0)
MPV: 10.9 fL (ref 7.5–12.5)
Monocytes Relative: 4.9 %
Neutro Abs: 7859 {cells}/uL — ABNORMAL HIGH (ref 1500–7800)
Neutrophils Relative %: 72.1 %
Platelets: 445 Thousand/uL — ABNORMAL HIGH (ref 140–400)
RBC: 4.25 Million/uL (ref 3.80–5.10)
RDW: 12.1 % (ref 11.0–15.0)
Total Lymphocyte: 22.1 %
WBC mixed population: 534 {cells}/uL (ref 200–950)
WBC: 10.9 Thousand/uL — ABNORMAL HIGH (ref 3.8–10.8)

## 2017-12-30 DIAGNOSIS — M19041 Primary osteoarthritis, right hand: Secondary | ICD-10-CM | POA: Diagnosis not present

## 2018-01-01 ENCOUNTER — Ambulatory Visit (INDEPENDENT_AMBULATORY_CARE_PROVIDER_SITE_OTHER): Payer: Medicare Other | Admitting: Family Medicine

## 2018-01-01 ENCOUNTER — Encounter: Payer: Self-pay | Admitting: Family Medicine

## 2018-01-01 VITALS — BP 120/78 | HR 73 | Temp 97.7°F | Ht 63.0 in | Wt 126.2 lb

## 2018-01-01 DIAGNOSIS — L03011 Cellulitis of right finger: Secondary | ICD-10-CM | POA: Diagnosis not present

## 2018-01-01 MED ORDER — DICLOFENAC SODIUM 1 % TD GEL: 2.0000 g | Freq: Four times a day (QID) | TRANSDERMAL | 0 refills | Status: AC

## 2018-01-01 NOTE — Assessment & Plan Note (Addendum)
Cellulitis appears to have resolved Still with some swelling, likely related to OA.  Trial of topical diclofenac. She may also try this on the shoulder as well Instructed to call if not improving and to return to clinic if having more swelling, pain or redness of thumb.

## 2018-01-01 NOTE — Progress Notes (Signed)
Jill Short - 84 y.o. female MRN [id number]  Date of birth: 1930/11/27  Subjective Chief Complaint  Patient presents with  . Follow-up    finger sore and swelling, painful down elbow/cant do much still/3 pill of abx left    HPI Jill Short is a 84 y.o. female here today for follow up of cellulitis of the thumb.  Has 1 day of antibiotic remaining.  Reports thumb is still swollen and has some decreased ROM but redness has subsided and pain has improved.  She is also having some pain in her R shoulder from trying to adjust to not being able to fully use her thumb.  She denies fever, chills, radiation of pain.  ROS:  ROS completed and negative except as noted per HPI.    No Known Allergies  Past Medical History:  Diagnosis Date  . Acute lower GI bleeding 10/02/2017  . Arthritis    "fingers" (10/02/2017)  . Atrial fibrillation ([place])   . Coronary artery disease   . Diet-controlled type 2 diabetes mellitus ([place])   . Diverticulosis   . Hypertension   . Lower GI bleed   . Migraine    "years ago; none in the 2000s" (10/02/2017)  . Pacemaker   . Rectal bleed 07/2016    Past Surgical History:  Procedure Laterality Date  . CATARACT EXTRACTION W/ INTRAOCULAR LENS  IMPLANT, BILATERAL Bilateral 06/18/2017 - 07/09/2017  . COLONOSCOPY N/A 01/06/2015   Procedure: COLONOSCOPY;  Surgeon: Laurence Spates, MD;  Location: [facility] ENDOSCOPY;  Service: Endoscopy;  Laterality: N/A;  . ESOPHAGOGASTRODUODENOSCOPY N/A 01/13/2015   Procedure: ESOPHAGOGASTRODUODENOSCOPY (EGD);  Surgeon: Laurence Spates, MD;  Location: [facility] ENDOSCOPY;  Service: Endoscopy;  Laterality: N/A;  . INSERT / REPLACE / REMOVE PACEMAKER  11/2015  . JOINT REPLACEMENT    . TOTAL HIP ARTHROPLASTY Bilateral     Social History   Socioeconomic History  . Marital status: Married    Spouse name: Not on file  . Number of children: Not on file  . Years of education: Not on file  . Highest education level: Not on file  Occupational  History  . Not on file  Social Needs  . Financial resource strain: Not on file  . Food insecurity:    Worry: Not on file    Inability: Not on file  . Transportation needs:    Medical: Not on file    Non-medical: Not on file  Tobacco Use  . Smoking status: Never Smoker  . Smokeless tobacco: Never Used  Substance and Sexual Activity  . Alcohol use: No  . Drug use: No  . Sexual activity: Yes  Lifestyle  . Physical activity:    Days per week: Not on file    Minutes per session: Not on file  . Stress: Not on file  Relationships  . Social connections:    Talks on phone: Not on file    Gets together: Not on file    Attends religious service: Not on file    Active member of club or organization: Not on file    Attends meetings of clubs or organizations: Not on file    Relationship status: Not on file  Other Topics Concern  . Not on file  Social History Narrative  . Not on file    Family History  Problem Relation Age of Onset  . Colon cancer Neg Hx   . Esophageal cancer Neg Hx   . Stomach cancer Neg Hx     Health  Maintenance  Topic Date Due  . TETANUS/TDAP  08/30/1951  . DEXA SCAN  08/29/1997  . PNA vac Low Risk Adult (1 of 2 - PCV13) 08/29/1997  . INFLUENZA VACCINE  02/19/2018  . FOOT EXAM  Discontinued  . HEMOGLOBIN A1C  Discontinued  . OPHTHALMOLOGY EXAM  Discontinued  . URINE MICROALBUMIN  Discontinued    ----------------------------------------------------------------------------------------------------------------------------------------------------------------------------------------------------------------- Physical Exam BP 120/78   Pulse 73   Temp 97.7 F (36.5 C) (Oral)   Ht 5\' 3"  (1.6 m)   Wt 126 lb 3.2 oz (57.2 kg)   SpO2 98%   BMI 22.36 kg/m   Physical Exam  Constitutional: She appears well-nourished. No distress.  HENT:  Head: Normocephalic and atraumatic.  Mouth/Throat: Oropharynx is clear and moist.  Musculoskeletal:  R MCP joint with  swelling and mild tenderness.  No erythema noted.  FROM of [facility] joint.    R shoulder normal to inspection and palpation.  Fairly good ROM.  Good strength.  Mild pain with hawkins test.   Neurological: She is alert.  Psychiatric: She has a normal mood and affect. Her behavior is normal.    ------------------------------------------------------------------------------------------------------------------------------------------------------------------------------------------------------------------- Assessment and Plan  Cellulitis of finger of right hand Cellulitis appears to have resolved Still with some swelling, likely related to OA.  Trial of topical diclofenac. She may also try this on the shoulder as well Instructed to call if not improving and to return to clinic if having more swelling, pain or redness of thumb.

## 2018-01-01 NOTE — Patient Instructions (Signed)
Complete antibiotic You may use diclofenac gel on thumb and shoulder as directed.  If this isn't improving let me know.  If it becomes more swollen or red again please return.

## 2018-01-26 ENCOUNTER — Telehealth: Payer: Self-pay | Admitting: Nurse Practitioner

## 2018-01-26 NOTE — Telephone Encounter (Signed)
Copied from [place] [id number]. Topic: Quick Communication - Rx Refill/Question >> Jan 26, 2018 11:42 AM Judyann Munson wrote: Medication: potassium chloride (K-DUR) 10 MEQ tablet   Has the patient contacted their pharmacy? No   Preferred Pharmacy (with phone number or street name): [place], [place]. [phone] (Phone) [phone] (Fax)      Agent: Please be advised that RX refills may take up to 3 business days. We ask that you follow-up with your pharmacy.

## 2018-01-26 NOTE — Telephone Encounter (Signed)
Pt Jill Short, we can wait until her appt in 08/19 to check K+ level and med refill, unless pt wants to come sooner for an appt.   [name] is aware of this and will let her mother know (Mrs. Keyari).

## 2018-01-26 NOTE — Telephone Encounter (Signed)
Spoke with the pt, denied this Rx requsted, we need to do blood work first. Pt verbalized understand.

## 2018-01-26 NOTE — Telephone Encounter (Signed)
Patient family is calling and is upset that she only has 1 pill and this has not been refilled yet. Notified them of the 24-72 business hour turn around. They are at the pharmacy waiting.

## 2018-01-27 ENCOUNTER — Encounter: Payer: Self-pay | Admitting: Nurse Practitioner

## 2018-01-27 ENCOUNTER — Ambulatory Visit (INDEPENDENT_AMBULATORY_CARE_PROVIDER_SITE_OTHER): Payer: Medicare Other | Admitting: Nurse Practitioner

## 2018-01-27 VITALS — BP 128/70 | HR 69 | Temp 97.9°F | Ht 63.0 in | Wt 127.0 lb

## 2018-01-27 DIAGNOSIS — E782 Mixed hyperlipidemia: Secondary | ICD-10-CM

## 2018-01-27 DIAGNOSIS — R197 Diarrhea, unspecified: Secondary | ICD-10-CM | POA: Diagnosis not present

## 2018-01-27 DIAGNOSIS — K922 Gastrointestinal hemorrhage, unspecified: Secondary | ICD-10-CM

## 2018-01-27 DIAGNOSIS — E876 Hypokalemia: Secondary | ICD-10-CM

## 2018-01-27 DIAGNOSIS — M19041 Primary osteoarthritis, right hand: Secondary | ICD-10-CM | POA: Diagnosis not present

## 2018-01-27 DIAGNOSIS — M25541 Pain in joints of right hand: Secondary | ICD-10-CM

## 2018-01-27 LAB — LIPID PANEL
CHOLESTEROL: 119 mg/dL (ref 0–200)
HDL: 40.1 mg/dL (ref >39.00)
LDL Cholesterol: 65 mg/dL (ref 0–99)
NonHDL: 78.5
TRIGLYCERIDES: 69 mg/dL (ref 0.0–149.0)
Total CHOL/HDL Ratio: 3
VLDL: 13.8 mg/dL (ref 0.0–40.0)

## 2018-01-27 LAB — BASIC METABOLIC PANEL
BUN: 21 mg/dL (ref 6–23)
CALCIUM: 9.3 mg/dL (ref 8.4–10.5)
CO2: 28 meq/L (ref 19–32)
Chloride: 104 mEq/L (ref 96–112)
Creatinine, Ser: 0.74 mg/dL (ref 0.40–1.20)
GFR: 79.2 mL/min (ref >60.00)
GLUCOSE: 85 mg/dL (ref 70–99)
Potassium: 4.5 mEq/L (ref 3.5–5.1)
SODIUM: 138 meq/L (ref 135–145)

## 2018-01-27 LAB — URIC ACID: URIC ACID, SERUM: 3.7 mg/dL (ref 2.4–7.0)

## 2018-01-27 NOTE — Progress Notes (Signed)
Subjective:  Patient ID: Jill Short, female    DOB: May 22, 1931  Age: 84 y.o. MRN: [id number]  CC: Hand Pain (right thumb painful,swelling,cant move it,and right middle finger painful at times. )   accompanied by daughter.  Hand Pain   The incident occurred more than 1 week ago. There was no injury mechanism. The pain is present in the right hand. The quality of the pain is described as aching and shooting. Radiates to: right wrist. The pain has been fluctuating since the incident. Pertinent negatives include no chest pain, muscle weakness, numbness or tingling. The symptoms are aggravated by movement and palpation. She has tried acetaminophen, ice, immobilization, rest and NSAIDs for the symptoms. The treatment provided moderate relief.  completed oral abx for cellulitis.  Unable to use oral NSAIDs due to chronic GI bleed and current use of xarelto.  Hypokalemia: Completed course of potassium supplement. Will like to know if more is needed.  Diarrhea (loose stool): Due to use of fibercon, flaxseed oil and miralax per daughter. Has led to stool incontinence. Denies ABD pain, blood in stool, paresthesia of rectum  BMP Latest Ref Rng & Units 01/27/2018 10/06/2017 10/05/2017  Glucose 70 - 99 mg/dL 85 103(H) 111(H)  BUN 6 - 23 mg/dL 21 13 17   Creatinine 0.40 - 1.20 mg/dL 0.74 0.61 0.62  BUN/Creat Ratio 6 - 22 (calc) - - -  Sodium 135 - 145 mEq/L 138 135 134(L)  Potassium 3.5 - 5.1 mEq/L 4.5 3.6 3.3(L)  Chloride 96 - 112 mEq/L 104 101 103  CO2 19 - 32 mEq/L 28 26 22   Calcium 8.4 - 10.5 mg/dL 9.3 8.8(L) 8.6(L)   Reviewed [facility] today.  Outpatient Medications Prior to Visit  Medication Sig Dispense Refill  . calcium-vitamin D (OSCAL WITH D) 500-200 MG-UNIT tablet Take 1 tablet by mouth daily with breakfast.    . cholecalciferol (VITAMIN D) 1000 UNITS tablet Take 1,000 Units by mouth daily.    . Cyanocobalamin (B-12 PO) Take 1 tablet by mouth daily.    . diclofenac sodium (VOLTAREN)  1 % GEL Apply 2 g topically 4 (four) times daily. 100 g 0  . fenofibrate micronized (LOFIBRA) 200 MG capsule Take 200 mg by mouth daily before breakfast.    . hydrocortisone (ANUSOL-HC) 2.5 % rectal cream Apply topically 2 (two) times daily. 30 g 0  . Rivaroxaban (XARELTO) 15 MG TABS tablet Take 1 tablet (15 mg total) by mouth daily. 30 tablet 11  . vitamin C (ASCORBIC ACID) 500 MG tablet Take 500 mg by mouth daily.    . ferrous sulfate 325 (65 FE) MG tablet Take 1 tablet (325 mg total) by mouth daily with breakfast. 90 tablet 1  . Flaxseed, Linseed, (FLAX PO) Take 1,200 mg by mouth daily.    . pantoprazole (PROTONIX) 20 MG tablet Take 1 tablet (20 mg total) by mouth daily. 90 tablet 1  . polycarbophil (FIBERCON) 625 MG tablet Take 1 tablet (625 mg total) by mouth daily. 60 tablet 2  . polyethylene glycol powder (GLYCOLAX/MIRALAX) powder Take 17 g by mouth daily as needed. (Patient taking differently: Take 17 g by mouth daily as needed for mild constipation. ) 850 g 11  . potassium chloride (K-DUR) 10 MEQ tablet Take 2 tablets (20 mEq total) by mouth daily. (Patient not taking: Reported on 01/27/2018) 180 tablet 0   No facility-administered medications prior to visit.     ROS See HPI  Objective:  BP 128/70   Pulse 69  Temp 97.9 F (36.6 C) (Oral)   Ht 5\' 3"  (1.6 m)   Wt 127 lb (57.6 kg)   SpO2 98%   BMI 22.50 kg/m   BP Readings from Last 3 Encounters:  01/27/18 128/70  01/01/18 120/78  12/26/17 118/80    Wt Readings from Last 3 Encounters:  01/27/18 127 lb (57.6 kg)  01/01/18 126 lb 3.2 oz (57.2 kg)  10/23/17 127 lb (57.6 kg)    Physical Exam  Constitutional: She is oriented to person, place, and time. No distress.  Cardiovascular: Normal rate.  Pulmonary/Chest: Effort normal.  Musculoskeletal: She exhibits tenderness.       Right wrist: She exhibits tenderness and deformity. She exhibits no effusion.       Arms: Bilateral fingers with bouchard's nodes.(no effusion, no  erythema)  Neurological: She is alert and oriented to person, place, and time.  Vitals reviewed.  Lab Results  Component Value Date   WBC 10.9 (H) 12/26/2017   HGB 13.0 12/26/2017   HCT 38.7 12/26/2017   PLT 445 (H) 12/26/2017   GLUCOSE 85 01/27/2018   CHOL 119 01/27/2018   TRIG 69.0 01/27/2018   HDL 40.10 01/27/2018   LDLCALC 65 01/27/2018   ALT 10 (L) 10/06/2017   AST 19 10/06/2017   NA 138 01/27/2018   K 4.5 01/27/2018   CL 104 01/27/2018   CREATININE 0.74 01/27/2018   BUN 21 01/27/2018   CO2 28 01/27/2018   TSH 1.85 07/11/2017   INR 1.30 06/11/2016   HGBA1C 4.4 01/19/2015    Assessment & Plan:   Ercia was seen today for hand pain.  Diagnoses and all orders for this visit:  Diarrhea, unspecified type -     Basic metabolic panel; Future -     Basic metabolic panel  Hypokalemia -     Basic metabolic panel; Future -     Basic metabolic panel  OA (osteoarthritis) of finger, right -     Uric acid -     predniSONE (DELTASONE) 20 MG tablet; Take 2 tablets (40 mg total) by mouth daily with breakfast.  Pain in thumb joint with movement of right hand -     Uric acid -     predniSONE (DELTASONE) 20 MG tablet; Take 2 tablets (40 mg total) by mouth daily with breakfast.  Mixed hyperlipidemia -     Lipid panel  Chronic GI bleeding -     ferrous sulfate 325 (65 FE) MG tablet; Take 1 tablet (325 mg total) by mouth daily with breakfast. -     pantoprazole (PROTONIX) 20 MG tablet; Take 1 tablet (20 mg total) by mouth daily.   I have discontinued Mayrani A. Beza's (Flaxseed, Linseed, (FLAX PO)), polycarbophil, polyethylene glycol powder, and potassium chloride. I am also having her start on predniSONE. Additionally, I am having her maintain her vitamin C, Cyanocobalamin (B-12 PO), cholecalciferol, fenofibrate micronized, calcium-vitamin D, hydrocortisone, Rivaroxaban, diclofenac sodium, ferrous sulfate, and pantoprazole.  Meds ordered this encounter  Medications  .  predniSONE (DELTASONE) 20 MG tablet    Sig: Take 2 tablets (40 mg total) by mouth daily with breakfast.    Dispense:  6 tablet    Refill:  0    Order Specific Question:   Supervising Provider    Answer:   [name] [[id number]]  . ferrous sulfate 325 (65 FE) MG tablet    Sig: Take 1 tablet (325 mg total) by mouth daily with breakfast.    Dispense:  90 tablet  Refill:  3    Order Specific Question:   Supervising Provider    Answer:   [name] [[id number]]  . pantoprazole (PROTONIX) 20 MG tablet    Sig: Take 1 tablet (20 mg total) by mouth daily.    Dispense:  90 tablet    Refill:  3    Order Specific Question:   Supervising Provider    Answer:   [name] [[id number]]    Follow-up: Return if symptoms worsen or fail to improve.  Wilfred Lacy, NP

## 2018-01-27 NOTE — Patient Instructions (Addendum)
Use flex seed oil, fibercon or miralax only as needed for constipation (if no BM in 2days).  Normal uric acid, lipid panel and BMP. No need for additional potassium. Sent oral prednisone x3days to help with joint inflammation.  Alternate between lidocaine patch OTC and diclofenac gel for thumb joint pain. May also use tylenol 500mg  every 8hrs as needed for pain.

## 2018-01-28 MED ORDER — FERROUS SULFATE 325 (65 FE) MG PO TABS: 325.0000 mg | ORAL_TABLET | Freq: Every day | ORAL | 3 refills | Status: AC

## 2018-01-28 MED ORDER — PANTOPRAZOLE SODIUM 20 MG PO TBEC: 20.0000 mg | DELAYED_RELEASE_TABLET | Freq: Every day | ORAL | 3 refills | Status: AC

## 2018-01-28 MED ORDER — PREDNISONE 20 MG PO TABS: 40.0000 mg | ORAL_TABLET | Freq: Every day | ORAL | 0 refills | Status: AC

## 2018-02-03 ENCOUNTER — Encounter: Payer: Self-pay | Admitting: Podiatry

## 2018-02-03 ENCOUNTER — Ambulatory Visit (INDEPENDENT_AMBULATORY_CARE_PROVIDER_SITE_OTHER): Payer: Medicare Other | Admitting: Podiatry

## 2018-02-03 DIAGNOSIS — M79675 Pain in left toe(s): Secondary | ICD-10-CM | POA: Diagnosis not present

## 2018-02-03 DIAGNOSIS — M79674 Pain in right toe(s): Secondary | ICD-10-CM | POA: Diagnosis not present

## 2018-02-03 DIAGNOSIS — B351 Tinea unguium: Secondary | ICD-10-CM

## 2018-02-04 NOTE — Progress Notes (Signed)
She presents today chief complaint of painful elongated toenails.  Objective: Vital signs are stable alert and oriented x3.  Toenails are long thick yellow dystrophic with mycotic painful palpation.  Assessment: Pain in limb secondary to onychomycosis.  Plan: Debridement of toenails 1 through 5 bilateral.

## 2018-02-07 ENCOUNTER — Emergency Department (HOSPITAL_COMMUNITY): Payer: No Typology Code available for payment source

## 2018-02-07 ENCOUNTER — Other Ambulatory Visit: Payer: Self-pay

## 2018-02-07 ENCOUNTER — Inpatient Hospital Stay (HOSPITAL_COMMUNITY)
Admission: EM | Admit: 2018-02-07 | Discharge: 2018-02-19 | DRG: 082 | Disposition: E | Payer: No Typology Code available for payment source | Attending: General Surgery | Admitting: General Surgery

## 2018-02-07 ENCOUNTER — Inpatient Hospital Stay (HOSPITAL_COMMUNITY): Payer: No Typology Code available for payment source

## 2018-02-07 ENCOUNTER — Encounter (HOSPITAL_COMMUNITY): Payer: Self-pay | Admitting: Radiology

## 2018-02-07 DIAGNOSIS — Z6824 Body mass index (BMI) 24.0-24.9, adult: Secondary | ICD-10-CM

## 2018-02-07 DIAGNOSIS — Y92481 Parking lot as the place of occurrence of the external cause: Secondary | ICD-10-CM

## 2018-02-07 DIAGNOSIS — S02402A Zygomatic fracture, unspecified, initial encounter for closed fracture: Secondary | ICD-10-CM | POA: Diagnosis not present

## 2018-02-07 DIAGNOSIS — S066X9A Traumatic subarachnoid hemorrhage with loss of consciousness of unspecified duration, initial encounter: Secondary | ICD-10-CM | POA: Diagnosis present

## 2018-02-07 DIAGNOSIS — J9601 Acute respiratory failure with hypoxia: Secondary | ICD-10-CM | POA: Diagnosis present

## 2018-02-07 DIAGNOSIS — Z4659 Encounter for fitting and adjustment of other gastrointestinal appliance and device: Secondary | ICD-10-CM

## 2018-02-07 DIAGNOSIS — S065XAA Traumatic subdural hemorrhage with loss of consciousness status unknown, initial encounter: Secondary | ICD-10-CM

## 2018-02-07 DIAGNOSIS — R000 Tachycardia, unspecified: Secondary | ICD-10-CM | POA: Diagnosis not present

## 2018-02-07 DIAGNOSIS — R4020 Unspecified coma: Secondary | ICD-10-CM | POA: Diagnosis not present

## 2018-02-07 DIAGNOSIS — I629 Nontraumatic intracranial hemorrhage, unspecified: Secondary | ICD-10-CM | POA: Diagnosis not present

## 2018-02-07 DIAGNOSIS — M40209 Unspecified kyphosis, site unspecified: Secondary | ICD-10-CM | POA: Diagnosis present

## 2018-02-07 DIAGNOSIS — S3991XA Unspecified injury of abdomen, initial encounter: Secondary | ICD-10-CM | POA: Diagnosis not present

## 2018-02-07 DIAGNOSIS — M25511 Pain in right shoulder: Secondary | ICD-10-CM

## 2018-02-07 DIAGNOSIS — Z7189 Other specified counseling: Secondary | ICD-10-CM | POA: Diagnosis not present

## 2018-02-07 DIAGNOSIS — S065X9A Traumatic subdural hemorrhage with loss of consciousness of unspecified duration, initial encounter: Secondary | ICD-10-CM | POA: Diagnosis not present

## 2018-02-07 DIAGNOSIS — Z950 Presence of cardiac pacemaker: Secondary | ICD-10-CM

## 2018-02-07 DIAGNOSIS — E870 Hyperosmolality and hypernatremia: Secondary | ICD-10-CM | POA: Diagnosis not present

## 2018-02-07 DIAGNOSIS — S0291XA Unspecified fracture of skull, initial encounter for closed fracture: Secondary | ICD-10-CM | POA: Diagnosis not present

## 2018-02-07 DIAGNOSIS — S065X0A Traumatic subdural hemorrhage without loss of consciousness, initial encounter: Secondary | ICD-10-CM | POA: Diagnosis not present

## 2018-02-07 DIAGNOSIS — D62 Acute posthemorrhagic anemia: Secondary | ICD-10-CM | POA: Diagnosis not present

## 2018-02-07 DIAGNOSIS — Z515 Encounter for palliative care: Secondary | ICD-10-CM | POA: Diagnosis not present

## 2018-02-07 DIAGNOSIS — S1982XA Other specified injuries of cervical trachea, initial encounter: Secondary | ICD-10-CM | POA: Diagnosis not present

## 2018-02-07 DIAGNOSIS — L8990 Pressure ulcer of unspecified site, unspecified stage: Secondary | ICD-10-CM

## 2018-02-07 DIAGNOSIS — R64 Cachexia: Secondary | ICD-10-CM | POA: Diagnosis present

## 2018-02-07 DIAGNOSIS — R58 Hemorrhage, not elsewhere classified: Secondary | ICD-10-CM | POA: Diagnosis not present

## 2018-02-07 DIAGNOSIS — R402332 Coma scale, best motor response, abnormal, at arrival to emergency department: Secondary | ICD-10-CM | POA: Diagnosis present

## 2018-02-07 DIAGNOSIS — Z66 Do not resuscitate: Secondary | ICD-10-CM | POA: Diagnosis present

## 2018-02-07 DIAGNOSIS — R402132 Coma scale, eyes open, to sound, at arrival to emergency department: Secondary | ICD-10-CM | POA: Diagnosis present

## 2018-02-07 DIAGNOSIS — I213 ST elevation (STEMI) myocardial infarction of unspecified site: Secondary | ICD-10-CM | POA: Diagnosis not present

## 2018-02-07 DIAGNOSIS — G8911 Acute pain due to trauma: Secondary | ICD-10-CM

## 2018-02-07 DIAGNOSIS — I495 Sick sinus syndrome: Secondary | ICD-10-CM | POA: Diagnosis present

## 2018-02-07 DIAGNOSIS — R402222 Coma scale, best verbal response, incomprehensible words, at arrival to emergency department: Secondary | ICD-10-CM | POA: Diagnosis present

## 2018-02-07 DIAGNOSIS — S3993XA Unspecified injury of pelvis, initial encounter: Secondary | ICD-10-CM | POA: Diagnosis not present

## 2018-02-07 DIAGNOSIS — I10 Essential (primary) hypertension: Secondary | ICD-10-CM | POA: Diagnosis present

## 2018-02-07 DIAGNOSIS — S4991XA Unspecified injury of right shoulder and upper arm, initial encounter: Secondary | ICD-10-CM | POA: Diagnosis not present

## 2018-02-07 DIAGNOSIS — I6200 Nontraumatic subdural hemorrhage, unspecified: Secondary | ICD-10-CM | POA: Diagnosis not present

## 2018-02-07 DIAGNOSIS — Z7901 Long term (current) use of anticoagulants: Secondary | ICD-10-CM

## 2018-02-07 DIAGNOSIS — R1110 Vomiting, unspecified: Secondary | ICD-10-CM | POA: Diagnosis not present

## 2018-02-07 DIAGNOSIS — S0990XA Unspecified injury of head, initial encounter: Secondary | ICD-10-CM

## 2018-02-07 DIAGNOSIS — I609 Nontraumatic subarachnoid hemorrhage, unspecified: Secondary | ICD-10-CM | POA: Diagnosis not present

## 2018-02-07 DIAGNOSIS — S43004A Unspecified dislocation of right shoulder joint, initial encounter: Secondary | ICD-10-CM | POA: Diagnosis not present

## 2018-02-07 DIAGNOSIS — S06360A Traumatic hemorrhage of cerebrum, unspecified, without loss of consciousness, initial encounter: Secondary | ICD-10-CM | POA: Diagnosis not present

## 2018-02-07 DIAGNOSIS — S066X0A Traumatic subarachnoid hemorrhage without loss of consciousness, initial encounter: Secondary | ICD-10-CM | POA: Diagnosis not present

## 2018-02-07 DIAGNOSIS — S299XXA Unspecified injury of thorax, initial encounter: Secondary | ICD-10-CM | POA: Diagnosis not present

## 2018-02-07 DIAGNOSIS — J9600 Acute respiratory failure, unspecified whether with hypoxia or hypercapnia: Secondary | ICD-10-CM | POA: Diagnosis not present

## 2018-02-07 DIAGNOSIS — I480 Paroxysmal atrial fibrillation: Secondary | ICD-10-CM | POA: Diagnosis present

## 2018-02-07 DIAGNOSIS — Z4682 Encounter for fitting and adjustment of non-vascular catheter: Secondary | ICD-10-CM | POA: Diagnosis not present

## 2018-02-07 DIAGNOSIS — D72829 Elevated white blood cell count, unspecified: Secondary | ICD-10-CM | POA: Diagnosis not present

## 2018-02-07 LAB — POCT I-STAT 3, ART BLOOD GAS (G3+)
Acid-base deficit: 2 mmol/L (ref 0.0–2.0)
Bicarbonate: 22.6 mmol/L (ref 20.0–28.0)
O2 Saturation: 100 %
PCO2 ART: 38.5 mmHg (ref 32.0–48.0)
Patient temperature: 36.7
TCO2: 24 mmol/L (ref 22–32)
pH, Arterial: 7.376 (ref 7.350–7.450)
pO2, Arterial: 319 mmHg — ABNORMAL HIGH (ref 83.0–108.0)

## 2018-02-07 LAB — COMPREHENSIVE METABOLIC PANEL
ALK PHOS: 44 U/L (ref 38–126)
ALT: 15 U/L (ref 0–44)
ANION GAP: 14 (ref 5–15)
AST: 36 U/L (ref 15–41)
Albumin: 3.4 g/dL — ABNORMAL LOW (ref 3.5–5.0)
BUN: 18 mg/dL (ref 8–23)
CO2: 19 mmol/L — AB (ref 22–32)
CREATININE: 0.88 mg/dL (ref 0.44–1.00)
Calcium: 9.4 mg/dL (ref 8.9–10.3)
Chloride: 107 mmol/L (ref 98–111)
GFR calc non Af Amer: 58 mL/min — ABNORMAL LOW (ref >60)
Glucose, Bld: 173 mg/dL — ABNORMAL HIGH (ref 70–99)
Potassium: 3.4 mmol/L — ABNORMAL LOW (ref 3.5–5.1)
SODIUM: 140 mmol/L (ref 135–145)
TOTAL PROTEIN: 7.6 g/dL (ref 6.5–8.1)
Total Bilirubin: 0.8 mg/dL (ref 0.3–1.2)

## 2018-02-07 LAB — PREPARE FRESH FROZEN PLASMA
UNIT DIVISION: 0
Unit division: 0

## 2018-02-07 LAB — BPAM FFP
BLOOD PRODUCT EXPIRATION DATE: 201908032359
BLOOD PRODUCT EXPIRATION DATE: 201908052359
ISSUE DATE / TIME: 201907201741
ISSUE DATE / TIME: 201907201741
UNIT TYPE AND RH: 6200
Unit Type and Rh: 600

## 2018-02-07 LAB — TYPE AND SCREEN
ABO/RH(D): A NEG
Antibody Screen: NEGATIVE
Unit division: 0
Unit division: 0

## 2018-02-07 LAB — URINALYSIS, ROUTINE W REFLEX MICROSCOPIC
Bilirubin Urine: NEGATIVE
GLUCOSE, UA: 50 mg/dL — AB
HGB URINE DIPSTICK: NEGATIVE
KETONES UR: NEGATIVE mg/dL
LEUKOCYTES UA: NEGATIVE
Nitrite: NEGATIVE
PROTEIN: NEGATIVE mg/dL
Specific Gravity, Urine: 1.027 (ref 1.005–1.030)
pH: 6 (ref 5.0–8.0)

## 2018-02-07 LAB — BPAM RBC
BLOOD PRODUCT EXPIRATION DATE: 201907252359
Blood Product Expiration Date: 201907262359
ISSUE DATE / TIME: 201907201740
ISSUE DATE / TIME: 201907201740
UNIT TYPE AND RH: 9500
UNIT TYPE AND RH: 9500

## 2018-02-07 LAB — I-STAT CHEM 8, ED
BUN: 23 mg/dL (ref 8–23)
CALCIUM ION: 1.14 mmol/L — AB (ref 1.15–1.40)
CHLORIDE: 107 mmol/L (ref 98–111)
CREATININE: 0.8 mg/dL (ref 0.44–1.00)
GLUCOSE: 165 mg/dL — AB (ref 70–99)
HCT: 44 % (ref 36.0–46.0)
Hemoglobin: 15 g/dL (ref 12.0–15.0)
POTASSIUM: 3.4 mmol/L — AB (ref 3.5–5.1)
Sodium: 140 mmol/L (ref 135–145)
TCO2: 19 mmol/L — ABNORMAL LOW (ref 22–32)

## 2018-02-07 LAB — PROTIME-INR
INR: 1.67
Prothrombin Time: 19.6 seconds — ABNORMAL HIGH (ref 11.4–15.2)

## 2018-02-07 LAB — ABO/RH: ABO/RH(D): A NEG

## 2018-02-07 LAB — CBC
HCT: 41.6 % (ref 36.0–46.0)
Hemoglobin: 13.2 g/dL (ref 12.0–15.0)
MCH: 30.1 pg (ref 26.0–34.0)
MCHC: 31.7 g/dL (ref 30.0–36.0)
MCV: 94.8 fL (ref 78.0–100.0)
PLATELETS: 315 10*3/uL (ref 150–400)
RBC: 4.39 MIL/uL (ref 3.87–5.11)
RDW: 15.7 % — ABNORMAL HIGH (ref 11.5–15.5)
WBC: 15 10*3/uL — ABNORMAL HIGH (ref 4.0–10.5)

## 2018-02-07 LAB — I-STAT TROPONIN, ED: Troponin i, poc: 1.17 ng/mL (ref 0.00–0.08)

## 2018-02-07 LAB — TRIGLYCERIDES: Triglycerides: 85 mg/dL (ref <150)

## 2018-02-07 LAB — ETHANOL

## 2018-02-07 LAB — MRSA PCR SCREENING: MRSA BY PCR: NEGATIVE

## 2018-02-07 LAB — I-STAT CG4 LACTIC ACID, ED: LACTIC ACID, VENOUS: 3.39 mmol/L — AB (ref 0.5–1.9)

## 2018-02-07 MED ORDER — EMPTY CONTAINERS FLEXIBLE MISC
900.0000 mg | Freq: Once | Status: AC
  Administered 2018-02-07: 900 mg via INTRAVENOUS
  Filled 2018-02-07: qty 90

## 2018-02-07 MED ORDER — ROCURONIUM BROMIDE 50 MG/5ML IV SOLN
INTRAVENOUS | Status: AC | PRN
  Administered 2018-02-07: 60 mg via INTRAVENOUS

## 2018-02-07 MED ORDER — IOHEXOL 300 MG/ML  SOLN
100.0000 mL | Freq: Once | INTRAMUSCULAR | Status: AC | PRN
  Administered 2018-02-07: 100 mL via INTRAVENOUS

## 2018-02-07 MED ORDER — ORAL CARE MOUTH RINSE
15.0000 mL | OROMUCOSAL | Status: DC
  Administered 2018-02-07 – 2018-02-12 (×45): 15 mL via OROMUCOSAL

## 2018-02-07 MED ORDER — PROPOFOL 1000 MG/100ML IV EMUL
INTRAVENOUS | Status: AC
  Filled 2018-02-07: qty 100

## 2018-02-07 MED ORDER — ONDANSETRON 4 MG PO TBDP: 4.0000 mg | ORAL_TABLET | Freq: Four times a day (QID) | ORAL | Status: DC | PRN

## 2018-02-07 MED ORDER — PROPOFOL 1000 MG/100ML IV EMUL
5.0000 ug/kg/min | Freq: Once | INTRAVENOUS | Status: AC
  Administered 2018-02-07: 5 ug/kg/min via INTRAVENOUS

## 2018-02-07 MED ORDER — CHLORHEXIDINE GLUCONATE 0.12% ORAL RINSE (MEDLINE KIT)
15.0000 mL | Freq: Two times a day (BID) | OROMUCOSAL | Status: DC
  Administered 2018-02-07 – 2018-02-15 (×16): 15 mL via OROMUCOSAL

## 2018-02-07 MED ORDER — ONDANSETRON HCL 4 MG/2ML IJ SOLN
INTRAMUSCULAR | Status: AC
  Filled 2018-02-07: qty 2

## 2018-02-07 MED ORDER — ETOMIDATE 2 MG/ML IV SOLN
INTRAVENOUS | Status: AC | PRN
  Administered 2018-02-07: 15 mg via INTRAVENOUS

## 2018-02-07 MED ORDER — FENTANYL BOLUS VIA INFUSION
25.0000 ug | INTRAVENOUS | Status: DC | PRN
  Filled 2018-02-07: qty 25

## 2018-02-07 MED ORDER — ONDANSETRON HCL 4 MG/2ML IJ SOLN
4.0000 mg | Freq: Once | INTRAMUSCULAR | Status: AC
  Administered 2018-02-07: 4 mg via INTRAVENOUS

## 2018-02-07 MED ORDER — FENTANYL CITRATE (PF) 100 MCG/2ML IJ SOLN: 50.0000 ug | Freq: Once | INTRAMUSCULAR | Status: AC

## 2018-02-07 MED ORDER — ONDANSETRON HCL 4 MG/2ML IJ SOLN: 4.0000 mg | Freq: Four times a day (QID) | INTRAMUSCULAR | Status: DC | PRN

## 2018-02-07 MED ORDER — PROPOFOL 1000 MG/100ML IV EMUL
0.0000 ug/kg/min | INTRAVENOUS | Status: DC
  Administered 2018-02-07: 30 ug/kg/min via INTRAVENOUS
  Administered 2018-02-08 – 2018-02-09 (×3): 15 ug/kg/min via INTRAVENOUS
  Filled 2018-02-07 (×3): qty 100

## 2018-02-07 MED ORDER — FENTANYL 2500MCG IN NS 250ML (10MCG/ML) PREMIX INFUSION
25.0000 ug/h | INTRAVENOUS | Status: DC
  Administered 2018-02-07: 50 ug/h via INTRAVENOUS
  Filled 2018-02-07: qty 250

## 2018-02-07 MED ORDER — SODIUM CHLORIDE 0.9 % IV SOLN
INTRAVENOUS | Status: DC
  Administered 2018-02-07 – 2018-02-10 (×6): via INTRAVENOUS

## 2018-02-07 MED ORDER — METOPROLOL TARTRATE 5 MG/5ML IV SOLN
5.0000 mg | Freq: Four times a day (QID) | INTRAVENOUS | Status: DC | PRN
  Administered 2018-02-10: 5 mg via INTRAVENOUS
  Filled 2018-02-07: qty 5

## 2018-02-07 MED ORDER — FAMOTIDINE IN NACL 20-0.9 MG/50ML-% IV SOLN
20.0000 mg | Freq: Two times a day (BID) | INTRAVENOUS | Status: DC
  Administered 2018-02-07 – 2018-02-10 (×7): 20 mg via INTRAVENOUS
  Filled 2018-02-07 (×7): qty 50
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
## 2018-02-07 NOTE — Progress Notes (Signed)
Pt transported to 4N15 without incidence
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
## 2018-02-07 NOTE — ED Notes (Signed)
Family at bedside. 

## 2018-02-07 NOTE — ED Notes (Signed)
[facility] Troponin Results of 1.17 reported to Dr. Laverta Baltimore.
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
## 2018-02-07 NOTE — ED Notes (Signed)
Pt taken to Ct by this RN, RT, and EMT.

## 2018-02-07 NOTE — ED Notes (Addendum)
All of pt's clothing, necklaces, rings and bracelets given to pt's daughter except for gold diamond ring on right ring finger.

## 2018-02-07 NOTE — Progress Notes (Signed)
Hulen Skains MD paged. No longer wants STAT cardiology consult. And No new orders for troponin of 1.17.

## 2018-02-07 NOTE — Progress Notes (Signed)
On call NS MD paged with current CT scan in Epic. MD made aware of current neuro exam and had discussion with daughter over phone.
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
## 2018-02-08 LAB — GLUCOSE, CAPILLARY
GLUCOSE-CAPILLARY: 155 mg/dL — AB (ref 70–99)
GLUCOSE-CAPILLARY: 132 mg/dL — AB (ref 70–99)
Glucose-Capillary: 143 mg/dL — ABNORMAL HIGH (ref 70–99)
Glucose-Capillary: 149 mg/dL — ABNORMAL HIGH (ref 70–99)

## 2018-02-08 LAB — BASIC METABOLIC PANEL
Anion gap: 11 (ref 5–15)
BUN: 17 mg/dL (ref 8–23)
CALCIUM: 9 mg/dL (ref 8.9–10.3)
CO2: 22 mmol/L (ref 22–32)
CREATININE: 0.84 mg/dL (ref 0.44–1.00)
Chloride: 108 mmol/L (ref 98–111)
GFR calc non Af Amer: >60 mL/min (ref >60)
Glucose, Bld: 174 mg/dL — ABNORMAL HIGH (ref 70–99)
Potassium: 3.7 mmol/L (ref 3.5–5.1)
SODIUM: 141 mmol/L (ref 135–145)

## 2018-02-08 LAB — CBC
HEMATOCRIT: 38.7 % (ref 36.0–46.0)
HEMOGLOBIN: 12.5 g/dL (ref 12.0–15.0)
MCH: 30.1 pg (ref 26.0–34.0)
MCHC: 32.3 g/dL (ref 30.0–36.0)
MCV: 93.3 fL (ref 78.0–100.0)
Platelets: 272 10*3/uL (ref 150–400)
RBC: 4.15 MIL/uL (ref 3.87–5.11)
RDW: 15.9 % — ABNORMAL HIGH (ref 11.5–15.5)
WBC: 20.2 10*3/uL — AB (ref 4.0–10.5)

## 2018-02-08 LAB — MAGNESIUM
MAGNESIUM: 1.5 mg/dL — AB (ref 1.7–2.4)
MAGNESIUM: 1.6 mg/dL — AB (ref 1.7–2.4)

## 2018-02-08 LAB — PROTIME-INR
INR: 1.45
PROTHROMBIN TIME: 17.5 s — AB (ref 11.4–15.2)

## 2018-02-08 LAB — PHOSPHORUS
PHOSPHORUS: 3.7 mg/dL (ref 2.5–4.6)
PHOSPHORUS: 3.1 mg/dL (ref 2.5–4.6)

## 2018-02-08 MED ORDER — VITAL HIGH PROTEIN PO LIQD
1000.0000 mL | ORAL | Status: DC
  Administered 2018-02-08 – 2018-02-09 (×2): 1000 mL

## 2018-02-08 MED ORDER — ALBUMIN HUMAN 5 % IV SOLN
25.0000 g | Freq: Once | INTRAVENOUS | Status: AC
  Administered 2018-02-08: 25 g via INTRAVENOUS
  Filled 2018-02-08: qty 500

## 2018-02-08 MED ORDER — PRO-STAT SUGAR FREE PO LIQD
30.0000 mL | Freq: Two times a day (BID) | ORAL | Status: DC
  Administered 2018-02-08 – 2018-02-09 (×3): 30 mL
  Filled 2018-02-08 (×3): qty 30

## 2018-02-08 MED ORDER — ACETAMINOPHEN 160 MG/5ML PO SOLN
650.0000 mg | Freq: Four times a day (QID) | ORAL | Status: DC | PRN
  Administered 2018-02-08 – 2018-02-11 (×4): 650 mg
  Filled 2018-02-08 (×4): qty 20.3

## 2018-02-08 NOTE — Progress Notes (Signed)
Patient transported on ventilator to CT and back to 4T36 with no complications. Vitals stable.

## 2018-02-08 NOTE — Progress Notes (Signed)
   02/08/18 1100  Clinical Encounter Type  Visited With Patient  Visit Type Initial  Referral From Nurse  Consult/Referral To Chaplain  Spiritual Encounters  Spiritual Needs Emotional  Stress Factors  Patient Stress Factors Exhausted    Pt was still intubated and seemingly very sick. No family member present on-site, they had just left. Chaplain had a meaningful discussion with pt's direct care nurse who briefed her about the pt. Chaplain provided compassionate presence and reflective empathic listening. Pt and family may need more spiritual care .  Jill Short a Medical sales representative, [employer]

## 2018-02-08 NOTE — Progress Notes (Signed)
Follow up - Trauma Critical Care  Patient Details:    Jill Short is an 84 y.o. female.  Lines/tubes : Airway 7.5 mm (Active)  Secured at (cm) 21 cm 02/08/2018  7:19 AM  Measured From Lips 02/08/2018  7:19 AM  Secured Location Left 02/08/2018  7:19 AM  Secured By [employer] 02/08/2018  7:19 AM  Tube Holder Repositioned Yes 02/08/2018  7:19 AM  Cuff Pressure (cm H2O) 24 cm H2O 02/08/2018  7:19 AM  Site Condition Dry 02/08/2018  7:19 AM     NG/OG Tube Orogastric 16 Fr. Center mouth Xray;Aucultation (Active)  Site Assessment Clean;Dry;Intact 02/05/2018  8:30 PM  Ongoing Placement Verification No change in respiratory status;No acute changes, not attributed to clinical condition;Xray 02/13/2018  8:30 PM  Status Suction-low intermittent 01/22/2018  8:30 PM  Drainage Appearance Yellow 01/19/2018  8:30 PM  Output (mL) 50 mL 02/08/2018  6:00 AM     Urethral Catheter [name] EMT Non-latex;Temperature probe 14 Fr. (Active)  Indication for Insertion or Continuance of Catheter Unstable critical patients (first 24-48 hours) 02/02/2018  8:30 PM  Site Assessment Clean;Intact 02/17/2018  8:30 PM  Catheter Maintenance Bag below level of bladder;Catheter secured;Drainage bag/tubing not touching floor;Seal intact;Insertion date on drainage bag;No dependent loops 02/06/2018  8:30 PM  Collection Container Standard drainage bag 02/01/2018  8:30 PM  Securement Method Leg strap 02/14/2018  8:30 PM  Urinary Catheter Interventions Unclamped 01/23/2018  6:37 PM  Output (mL) 300 mL 02/08/2018  6:00 AM    Microbiology/Sepsis markers: Results for orders placed or performed during the hospital encounter of 02/11/2018  MRSA PCR Screening     Status: None   Collection Time: 02/12/2018  8:32 PM  Result Value Ref Range Status   MRSA by PCR NEGATIVE NEGATIVE Final    Comment:        The GeneXpert MRSA Assay (FDA approved for NASAL specimens only), is one component of a comprehensive MRSA  colonization surveillance program. It is not intended to diagnose MRSA infection nor to guide or monitor treatment for MRSA infections. Performed at [facility], [place] [address] [place], [place] [address]     Anti-infectives:  Anti-infectives (From admission, onward)   None      Consults: Treatment Team:  Earnie Larsson, MD    Studies:    Events:  Subjective:    Overnight Issues:   Objective:  Vital signs for last 24 hours: Temp:  [96.8 F (36 C)-100.2 F (37.9 C)] 100.2 F (37.9 C) (07/21 0700) Pulse Rate:  [53-108] 70 (07/21 0730) Resp:  [10-49] 12 (07/21 0730) BP: (90-176)/(65-128) 94/67 (07/21 0730) SpO2:  [90 %-100 %] 100 % (07/21 0730) FiO2 (%):  [40 %-100 %] 40 % (07/21 0719) Weight:  [57.6 kg (126 lb 15.8 oz)-61.7 kg (136 lb)] 57.6 kg (126 lb 15.8 oz) (07/20 2028)  Hemodynamic parameters for last 24 hours:    Intake/Output from previous day: 07/20 0701 - 07/21 0700 In: 1307.9 [I.V.:1167.9; IV Piggyback:140] Out: 700 [Urine:650; Emesis/NG output:50]  Intake/Output this shift: No intake/output data recorded.  Vent settings for last 24 hours: Vent Mode: PRVC FiO2 (%):  [40 %-100 %] 40 % Set Rate:  [15 bmp] 15 bmp Vt Set:  [400 mL] 400 mL PEEP:  [5 cmH20] 5 cmH20 Plateau Pressure:  [12 ZGY17-49 cmH20] 14 cmH20  Physical Exam:  Gen on vent Neuro pupils 40mm, sedated Lungs CTA CV Irr irr Abd soft, NT, ND Ext calves soft  Results for orders  placed or performed during the hospital encounter of 01/20/2018 (from the past 24 hour(s))  Comprehensive metabolic panel     Status: Abnormal   Collection Time: 01/20/2018  5:18 PM  Result Value Ref Range   Sodium 140 135 - 145 mmol/L   Potassium 3.4 (L) 3.5 - 5.1 mmol/L   Chloride 107 98 - 111 mmol/L   CO2 19 (L) 22 - 32 mmol/L   Glucose, Bld 173 (H) 70 - 99 mg/dL   BUN 18 8 - 23 mg/dL   Creatinine, Ser 0.88 0.44 - 1.00 mg/dL   Calcium 9.4 8.9 - 10.3 mg/dL   Total Protein 7.6 6.5 - 8.1 g/dL    Albumin 3.4 (L) 3.5 - 5.0 g/dL   AST 36 15 - 41 U/L   ALT 15 0 - 44 U/L   Alkaline Phosphatase 44 38 - 126 U/L   Total Bilirubin 0.8 0.3 - 1.2 mg/dL   GFR calc non Af Amer 58 (L) >60 mL/min   GFR calc Af Amer >60 >60 mL/min   Anion gap 14 5 - 15  CBC     Status: Abnormal   Collection Time: 02/11/2018  5:18 PM  Result Value Ref Range   WBC 15.0 (H) 4.0 - 10.5 K/uL   RBC 4.39 3.87 - 5.11 MIL/uL   Hemoglobin 13.2 12.0 - 15.0 g/dL   HCT 41.6 36.0 - 46.0 %   MCV 94.8 78.0 - 100.0 fL   MCH 30.1 26.0 - 34.0 pg   MCHC 31.7 30.0 - 36.0 g/dL   RDW 15.7 (H) 11.5 - 15.5 %   Platelets 315 150 - 400 K/uL  Ethanol     Status: None   Collection Time: 02/14/2018  5:18 PM  Result Value Ref Range   Alcohol, Ethyl (B) <10 <10 mg/dL  Protime-INR     Status: Abnormal   Collection Time: 02/13/2018  5:18 PM  Result Value Ref Range   Prothrombin Time 19.6 (H) 11.4 - 15.2 seconds   INR 1.67   Type and screen     Status: None   Collection Time: 02/08/2018  5:18 PM  Result Value Ref Range   ABO/RH(D) A NEG    Antibody Screen NEG    Sample Expiration 02/10/2018    Unit Number [id number]    Blood Component Type RED CELLS,LR    Unit division 00    Status of Unit REL FROM [facility]    Unit tag comment VERBAL ORDERS PER DR LONG    Transfusion Status OK TO TRANSFUSE    Crossmatch Result COMPATIBLE    Unit Number [id number]    Blood Component Type RBC, LR IRR    Unit division 00    Status of Unit REL FROM [facility]    Unit tag comment VERBAL ORDERS PER DR LONG    Transfusion Status OK TO TRANSFUSE    Crossmatch Result COMPATIBLE   ABO/Rh     Status: None   Collection Time: 01/28/2018  5:18 PM  Result Value Ref Range   ABO/RH(D)      A NEG Performed at [facility], [address] [place], [place] [address]   I-Stat Chem 8, ED     Status: Abnormal   Collection Time: 02/10/2018  5:32 PM  Result Value Ref Range   Sodium 140 135 - 145 mmol/L   Potassium 3.4 (L) 3.5 - 5.1 mmol/L   Chloride 107 98 - 111  mmol/L   BUN 23 8 - 23 mg/dL  Creatinine, Ser 0.80 0.44 - 1.00 mg/dL   Glucose, Bld 165 (H) 70 - 99 mg/dL   Calcium, Ion 1.14 (L) 1.15 - 1.40 mmol/L   TCO2 19 (L) 22 - 32 mmol/L   Hemoglobin 15.0 12.0 - 15.0 g/dL   HCT 44.0 36.0 - 46.0 %  I-Stat CG4 Lactic Acid, ED     Status: Abnormal   Collection Time: 01/20/2018  5:32 PM  Result Value Ref Range   Lactic Acid, Venous 3.39 (HH) 0.5 - 1.9 mmol/L   Comment NOTIFIED PHYSICIAN   Prepare fresh frozen plasma     Status: None   Collection Time: 02/18/2018  5:38 PM  Result Value Ref Range   Unit Number [id number]    Blood Component Type LIQ PLASMA    Unit division 00    Status of Unit REL FROM [facility]    Unit tag comment VERBAL ORDERS PER DR LONG    Transfusion Status      OK TO TRANSFUSE Performed at [facility], [address] [place], [place] [address]    Unit Number [id number]    Blood Component Type LIQ PLASMA    Unit division 00    Status of Unit REL FROM [facility]    Unit tag comment VERBAL ORDERS PER DR LONG    Transfusion Status OK TO TRANSFUSE   Urinalysis, Routine w reflex microscopic     Status: Abnormal   Collection Time: 02/05/2018  7:02 PM  Result Value Ref Range   Color, Urine YELLOW YELLOW   APPearance CLEAR CLEAR   Specific Gravity, Urine 1.027 1.005 - 1.030   pH 6.0 5.0 - 8.0   Glucose, UA 50 (A) NEGATIVE mg/dL   Hgb urine dipstick NEGATIVE NEGATIVE   Bilirubin Urine NEGATIVE NEGATIVE   Ketones, ur NEGATIVE NEGATIVE mg/dL   Protein, ur NEGATIVE NEGATIVE mg/dL   Nitrite NEGATIVE NEGATIVE   Leukocytes, UA NEGATIVE NEGATIVE  Triglycerides     Status: None   Collection Time: 01/25/2018  7:36 PM  Result Value Ref Range   Triglycerides 85 <150 mg/dL  I-Stat Troponin, ED (not at [facility])     Status: Abnormal   Collection Time: 02/01/2018  7:50 PM  Result Value Ref Range   Troponin i, poc 1.17 (HH) 0.00 - 0.08 ng/mL   Comment NOTIFIED PHYSICIAN    Comment 3          MRSA PCR Screening     Status: None    Collection Time: 02/11/2018  8:32 PM  Result Value Ref Range   MRSA by PCR NEGATIVE NEGATIVE  I-STAT 3, arterial blood gas (G3+)     Status: Abnormal   Collection Time: 01/27/2018  9:45 PM  Result Value Ref Range   pH, Arterial 7.376 7.350 - 7.450   pCO2 arterial 38.5 32.0 - 48.0 mmHg   pO2, Arterial 319.0 (H) 83.0 - 108.0 mmHg   Bicarbonate 22.6 20.0 - 28.0 mmol/L   TCO2 24 22 - 32 mmol/L   O2 Saturation 100.0 %   Acid-base deficit 2.0 0.0 - 2.0 mmol/L   Patient temperature 36.7 C    Collection site RADIAL, ALLEN'S TEST ACCEPTABLE    Drawn by Operator    Sample type ARTERIAL   CBC     Status: Abnormal   Collection Time: 02/08/18  3:31 AM  Result Value Ref Range   WBC 20.2 (H) 4.0 - 10.5 K/uL   RBC 4.15 3.87 - 5.11 MIL/uL   Hemoglobin 12.5 12.0 - 15.0  g/dL   HCT 38.7 36.0 - 46.0 %   MCV 93.3 78.0 - 100.0 fL   MCH 30.1 26.0 - 34.0 pg   MCHC 32.3 30.0 - 36.0 g/dL   RDW 15.9 (H) 11.5 - 15.5 %   Platelets 272 150 - 400 K/uL  Basic metabolic panel     Status: Abnormal   Collection Time: 02/08/18  3:31 AM  Result Value Ref Range   Sodium 141 135 - 145 mmol/L   Potassium 3.7 3.5 - 5.1 mmol/L   Chloride 108 98 - 111 mmol/L   CO2 22 22 - 32 mmol/L   Glucose, Bld 174 (H) 70 - 99 mg/dL   BUN 17 8 - 23 mg/dL   Creatinine, Ser 0.84 0.44 - 1.00 mg/dL   Calcium 9.0 8.9 - 10.3 mg/dL   GFR calc non Af Amer >60 >60 mL/min   GFR calc Af Amer >60 >60 mL/min   Anion gap 11 5 - 15  Protime-INR     Status: Abnormal   Collection Time: 02/08/18  3:31 AM  Result Value Ref Range   Prothrombin Time 17.5 (H) 11.4 - 15.2 seconds   INR 1.45     Assessment & Plan: Present on Admission: . Subdural hematoma ([place])    LOS: 1 day   Additional comments:I reviewed the patient's new clinical lab test results. Marland Kitchen PHBC Severe TBI/skull FX/B SAH and SDH/new IVH on F/U CT head - per Dr. Annette Stable. No operative intervention indicated.  To me, her prognosis does not look favorable for functional recovery. Acute  hypoxic ventilator dependent respiratory failure - full support CV - albumin bolus for low BP FEN - start TF, Na OK VTE - PAS DIspo - ICU, I spoke at length with husband and daughter. Will continue full support for 2-3d to see if there is any improvement. If not, likely withdraw then.  Critical Care Total Time*: 35 Minutes  Georganna Skeans, MD, MPH, [facility] Trauma: [phone] General Surgery: [phone]  02/08/2018  *Care during the described time interval was provided by me. I have reviewed this patient's available data, including medical history, events of note, physical examination and test results as part of my evaluation.  Patient ID: Jill Short, female   DOB: 1931/07/17, 84 y.o.   MRN: [id number]

## 2018-02-08 NOTE — Progress Notes (Signed)
Post injury day 1.  Patient intubated and sedated in ICU.  Hemodynamically stable.  No new issues or problems.  Pupils remain pinpoint.  Moving her lower extremities spontaneously.  Minimal movement of her upper extremities but will intermittently spontaneously move these.   Follow-up head CT scan yesterday demonstrates no significant change in her left-sided subdural hematoma.  The right temporal hematoma has enlarged somewhat.  The scan is somewhat difficult to interpret because I think a lot of the blood products have been stained with IV contrast from her CT scan of her chest and abdomen.  Overall stable.  Patient with severe bilateral traumatic brain injury secondary to fall and complicated by anticoagulation.  Plan continued ICU observation and obtain follow-up head CT scan in morning.

## 2018-02-08 NOTE — Progress Notes (Addendum)
Pt hypotensive with SYS BP in 80s. Temp 101.2. Trauma MD paged and new orders for tylenol PRN and albumin entered.

## 2018-02-09 ENCOUNTER — Inpatient Hospital Stay (HOSPITAL_COMMUNITY): Payer: No Typology Code available for payment source

## 2018-02-09 DIAGNOSIS — L8990 Pressure ulcer of unspecified site, unspecified stage: Secondary | ICD-10-CM

## 2018-02-09 LAB — BASIC METABOLIC PANEL
Anion gap: 7 (ref 5–15)
BUN: 33 mg/dL — ABNORMAL HIGH (ref 8–23)
CHLORIDE: 111 mmol/L (ref 98–111)
CO2: 23 mmol/L (ref 22–32)
Calcium: 8.5 mg/dL — ABNORMAL LOW (ref 8.9–10.3)
Creatinine, Ser: 0.71 mg/dL (ref 0.44–1.00)
GFR calc Af Amer: >60 mL/min (ref >60)
GFR calc non Af Amer: >60 mL/min (ref >60)
GLUCOSE: 122 mg/dL — AB (ref 70–99)
POTASSIUM: 4 mmol/L (ref 3.5–5.1)
Sodium: 141 mmol/L (ref 135–145)

## 2018-02-09 LAB — GLUCOSE, CAPILLARY
GLUCOSE-CAPILLARY: 113 mg/dL — AB (ref 70–99)
Glucose-Capillary: 126 mg/dL — ABNORMAL HIGH (ref 70–99)
Glucose-Capillary: 204 mg/dL — ABNORMAL HIGH (ref 70–99)
Glucose-Capillary: 162 mg/dL — ABNORMAL HIGH (ref 70–99)
Glucose-Capillary: 165 mg/dL — ABNORMAL HIGH (ref 70–99)
Glucose-Capillary: 156 mg/dL — ABNORMAL HIGH (ref 70–99)

## 2018-02-09 LAB — CBC
HCT: 32.2 % — ABNORMAL LOW (ref 36.0–46.0)
HEMOGLOBIN: 10.2 g/dL — AB (ref 12.0–15.0)
MCH: 29.7 pg (ref 26.0–34.0)
MCHC: 31.7 g/dL (ref 30.0–36.0)
MCV: 93.6 fL (ref 78.0–100.0)
Platelets: 182 10*3/uL (ref 150–400)
RBC: 3.44 MIL/uL — AB (ref 3.87–5.11)
RDW: 16.1 % — ABNORMAL HIGH (ref 11.5–15.5)
WBC: 13.2 10*3/uL — ABNORMAL HIGH (ref 4.0–10.5)

## 2018-02-09 LAB — MAGNESIUM
MAGNESIUM: 1.7 mg/dL (ref 1.7–2.4)
MAGNESIUM: 1.3 mg/dL — AB (ref 1.7–2.4)

## 2018-02-09 LAB — PHOSPHORUS
PHOSPHORUS: 1.5 mg/dL — AB (ref 2.5–4.6)
Phosphorus: 2.6 mg/dL (ref 2.5–4.6)

## 2018-02-09 LAB — BLOOD PRODUCT ORDER (VERBAL) VERIFICATION

## 2018-02-09 MED ORDER — ALBUMIN HUMAN 5 % IV SOLN
12.5000 g | Freq: Once | INTRAVENOUS | Status: AC
  Administered 2018-02-09: 12.5 g via INTRAVENOUS
  Filled 2018-02-09: qty 250
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
## 2018-02-09 NOTE — Progress Notes (Signed)
Patient extubated per Dr Richarda Blade order. Extubation went well and with no complications. Bilateral breath sounds were clear after extubation. Patient did not say any words post extubation but I will continue to encourage patient to speak. Patient is resting comfortably and all vital signs are WNL's. Will continue to monitor patient.

## 2018-02-09 NOTE — Progress Notes (Signed)
Patient transported on ventilator to CT and back to 4X32 with no complications. Vitals stable.

## 2018-02-09 NOTE — Progress Notes (Signed)
Husband was able to remove ring from R hand. Took ring home with him.

## 2018-02-09 NOTE — Progress Notes (Signed)
Patient is weaning well enough to be extubated.  This is not considered a terminal extubation.  The family has not decided if they would like for her to be reintubated.  Even so, I think she is doing well enough to tolerate extubation.  Kathryne Eriksson. Dahlia Bailiff, MD, [place] [phone] Trauma Surgeon
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
## 2018-02-09 NOTE — Consult Note (Signed)
[employer] Nurse wound consult note Reason for Consult: new onset DTI.  Daughter reports at the time of the patient's last admission she also had a "bedsore" in this same area.  Wound type: deep tissue pressure injury Pressure Injury POA: No Measurement: 3.5cm x 2.0cm x 0.cm  Wound MDY:JWLK, non blanchable purple tissue with some superficial blistering centrally Drainage (amount, consistency, odor) none Periwound: intact  Dressing procedure/placement/frequency: Silicone foam to protect area, assess for changes under dressing each shift  Progressa low air loss mattress in place while in the ICU, will need low air loss at the time of DC from ICU. Maximize nutrition for wound healing Explained need for turning patient from side to side to patient's daughter and husband.  [place] Nurse team will follow along with you for weekly wound assessments.  Please notify me of any acute changes in the wounds or any new areas of concerns [place] MSN, RN,CWOCN, [place], [address]

## 2018-02-09 NOTE — Progress Notes (Signed)
Pt with low UO Trauma MD made aware and albumin bolus ordered.

## 2018-02-09 NOTE — Progress Notes (Signed)
Patient extubated earlier this morning.  Breathing easily.  She is awake and says a few words.  Still has some mild right upper extremity weakness but following commands on that side.  Follow-up head CT scan demonstrates stable appearance of her right temporal lobe hemorrhage and stable appearance for small left convexity subdural hematoma.  Overall doing reasonably well.  Continue supportive efforts.  May mobilize as tolerated.
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
## 2018-02-09 NOTE — Procedures (Signed)
Extubation Procedure Note  Patient Details:   Name: Jill Short DOB: 1930-09-14 MRN: [id number]   Airway Documentation:    Vent end date: 02/09/18 Vent end time: 1219   Evaluation  O2 sats: stable throughout Complications: No apparent complications Patient did tolerate procedure well. Bilateral Breath Sounds: Clear   The patient was able to breathe around the tube before extubation. The patient has not said any words post extubation at this time. She tolerated extubation well without incident. Will continue to monitor. Acquanetta Belling 02/09/2018, 12:19 PM
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
## 2018-02-09 NOTE — Progress Notes (Signed)
Inpatient Rehabilitation  Per TBI team request, patient was screened by Gunnar Fusi for appropriateness for an Inpatient Acute Rehab consult.  At this time note that patient presents at a Rancho Level II, we are planning on following along for therapy progress prior to requesting a formal an Inpatient Rehab consult.  Call if questions.    Carmelia Roller., CCC/SLP Admission Coordinator  [place]  Cell [phone]

## 2018-02-09 NOTE — Progress Notes (Signed)
TBI TEAM EVALUATION  HPI:  Occupation: retired Lawyer: [employer]  Loss of conscious:  No     If yes, length of time? NA  Intubation:   Yes                   If yes, location/ dates? 02/14/2018-02/09/18  MRI complete: No Date:NA- CT scans          Initial CT:Yes Date:02/06/2018 Results:1. Acute bilateral subdural and subarachnoid hemorrhage without shift, with subdural hematoma measuring up to 10 mm on the left and 8 mm on the right. 2. Right high parietal scalp contusion/hematoma and laceration. No underlying skull fracture Pertinent F/u CT:yes Date:7/20, 02/09/18 Results: stable  Pertinent Chest xray: yes Date:02/13/2018 Results:IMPRESSION: 1. Potential RIGHT shoulder dislocation. 2. No pneumothorax.  No pulmonary contusion.  Initial GCS score: First filed 8 F/u GCS: Last filed 8       Sedation required:Yes ,while ventilated  Currently sedated:No,  Sedation lifted? :Yes, 02/16/2018 AM for extubation   Response: flat affect, difficult to arouse in bed  Following Commands: No         Pupil Appearance: abnormal - pinpoint pupils bil, not tested if react to light,  Response to Sensory Testing: NT, NA  (one or the other)    Primitive reflexes present: Yes    ("x" if present)  grasp   snout   bite   Tongue thrust   sucking   rooting   Flexor withdrawal X to painful stimuli  Extensor thrust   palmonmental   babinski   Asymmetrical tonic neck reflex   glabellar    Additional Skilled Neurobehavioral abnormalities: No   ("x" if present)  Decerebrate   Decorticate   Posturing    Precautions: ICP Pressure: no  Range:NA- NA Other:yes See orders for BP parameters

## 2018-02-09 NOTE — Progress Notes (Signed)
Follow up - Trauma and Critical Care  Patient Details:    Jill Short is an 84 y.o. female.  Lines/tubes : Airway 7.5 mm (Active)  Secured at (cm) 21 cm 02/09/2018  7:19 AM  Measured From Lips 02/09/2018  7:19 AM  Labette 02/09/2018  7:19 AM  Secured By [employer] 02/09/2018  7:19 AM  Tube Holder Repositioned Yes 02/09/2018  7:19 AM  Cuff Pressure (cm H2O) 26 cm H2O 02/09/2018  7:19 AM  Site Condition Cool;Dry 02/09/2018  7:19 AM     NG/OG Tube Orogastric 16 Fr. Center mouth Xray;Aucultation (Active)  Site Assessment Clean;Dry;Intact 02/08/2018  8:00 PM  Ongoing Placement Verification No change in respiratory status;No acute changes, not attributed to clinical condition 02/08/2018  8:00 PM  Status Infusing tube feed 02/08/2018  8:00 PM  Amount of suction 88 mmHg 02/08/2018  8:00 AM  Drainage Appearance Yellow 02/08/2018  8:00 AM  Output (mL) 125 mL 02/08/2018 12:00 PM     Urethral Catheter [name] EMT Non-latex;Temperature probe 14 Fr. (Active)  Indication for Insertion or Continuance of Catheter Unstable critical patients (first 24-48 hours) 02/08/2018  8:00 PM  Site Assessment Clean;Intact 02/08/2018  8:00 PM  Catheter Maintenance Bag below level of bladder;Catheter secured;Drainage bag/tubing not touching floor;Seal intact;Insertion date on drainage bag;No dependent loops 02/08/2018  8:00 PM  Collection Container Standard drainage bag 02/08/2018  8:00 PM  Securement Method Securing device (Describe) 02/08/2018  8:00 PM  Urinary Catheter Interventions Unclamped 01/27/2018  6:37 PM  Output (mL) 25 mL 02/09/2018  6:37 AM    Microbiology/Sepsis markers: Results for orders placed or performed during the hospital encounter of 02/03/2018  MRSA PCR Screening     Status: None   Collection Time: 02/17/2018  8:32 PM  Result Value Ref Range Status   MRSA by PCR NEGATIVE NEGATIVE Final    Comment:        The GeneXpert MRSA Assay (FDA approved for NASAL specimens only),  is one component of a comprehensive MRSA colonization surveillance program. It is not intended to diagnose MRSA infection nor to guide or monitor treatment for MRSA infections. Performed at [facility], [place] [address] [place], [place] [address]     Anti-infectives:  Anti-infectives (From admission, onward)   None      Best Practice/Protocols:  VTE Prophylaxis: Mechanical GI Prophylaxis: Proton Pump Inhibitor Continous Sedation  Consults: Treatment Team:  Earnie Larsson, MD    Events:  Subjective:    Overnight Issues: BP has been a bit soft and given boluses for urine output being low.  Objective:  Vital signs for last 24 hours: Temp:  [99.5 F (37.5 C)-101.3 F (38.5 C)] 99.5 F (37.5 C) (07/22 0630) Pulse Rate:  [68-73] 70 (07/22 0630) Resp:  [10-21] 15 (07/22 0630) BP: (87-111)/(56-76) 100/66 (07/22 0630) SpO2:  [98 %-100 %] 100 % (07/22 0719) FiO2 (%):  [40 %] 40 % (07/22 0719) Weight:  [60.2 kg (132 lb 11.5 oz)] 60.2 kg (132 lb 11.5 oz) (07/22 0254)  Hemodynamic parameters for last 24 hours:    Intake/Output from previous day: 07/21 0701 - 07/22 0700 In: 2072.8 [I.V.:1709.5; NG/GT:313.3; IV Piggyback:50] Out: 805 [Urine:680; Emesis/NG output:125]  Intake/Output this shift: No intake/output data recorded.  Vent settings for last 24 hours: Vent Mode: PRVC FiO2 (%):  [40 %] 40 % Set Rate:  [15 bmp] 15 bmp Vt Set:  [400 mL] 400 mL PEEP:  [5 cmH20] 5 cmH20 Plateau Pressure:  [9  cmH20-15 cmH20] 9 cmH20  Physical Exam:  General: no respiratory distress and not responsive. Neuro: RASS -2, weakness right upper extremity and weakness left lower extremity Resp: Oxygen saturations are good. CVS: regular rate and rhythm, S1, S2 normal, no murmur, click, rub or gallop GI: Soft and tolerating tube feedings well. Skin: no rash and pressure ulcer stage deep tissue injury, unstageable Extremities: no edema, no erythema, pulses WNL  Results for orders  placed or performed during the hospital encounter of 02/09/2018 (from the past 24 hour(s))  Magnesium     Status: Abnormal   Collection Time: 02/08/18  8:47 AM  Result Value Ref Range   Magnesium 1.5 (L) 1.7 - 2.4 mg/dL  Phosphorus     Status: None   Collection Time: 02/08/18  8:47 AM  Result Value Ref Range   Phosphorus 3.7 2.5 - 4.6 mg/dL  Glucose, capillary     Status: Abnormal   Collection Time: 02/08/18 11:35 AM  Result Value Ref Range   Glucose-Capillary 155 (H) 70 - 99 mg/dL  Magnesium     Status: Abnormal   Collection Time: 02/08/18  4:21 PM  Result Value Ref Range   Magnesium 1.6 (L) 1.7 - 2.4 mg/dL  Phosphorus     Status: None   Collection Time: 02/08/18  4:21 PM  Result Value Ref Range   Phosphorus 3.1 2.5 - 4.6 mg/dL  Glucose, capillary     Status: Abnormal   Collection Time: 02/08/18  4:22 PM  Result Value Ref Range   Glucose-Capillary 132 (H) 70 - 99 mg/dL  Glucose, capillary     Status: Abnormal   Collection Time: 02/08/18  8:50 PM  Result Value Ref Range   Glucose-Capillary 149 (H) 70 - 99 mg/dL  Glucose, capillary     Status: Abnormal   Collection Time: 02/08/18 11:31 PM  Result Value Ref Range   Glucose-Capillary 143 (H) 70 - 99 mg/dL  Glucose, capillary     Status: Abnormal   Collection Time: 02/09/18  3:05 AM  Result Value Ref Range   Glucose-Capillary 113 (H) 70 - 99 mg/dL  CBC     Status: Abnormal   Collection Time: 02/09/18  3:10 AM  Result Value Ref Range   WBC 13.2 (H) 4.0 - 10.5 K/uL   RBC 3.44 (L) 3.87 - 5.11 MIL/uL   Hemoglobin 10.2 (L) 12.0 - 15.0 g/dL   HCT 32.2 (L) 36.0 - 46.0 %   MCV 93.6 78.0 - 100.0 fL   MCH 29.7 26.0 - 34.0 pg   MCHC 31.7 30.0 - 36.0 g/dL   RDW 16.1 (H) 11.5 - 15.5 %   Platelets 182 150 - 400 K/uL  Basic metabolic panel     Status: Abnormal   Collection Time: 02/09/18  3:10 AM  Result Value Ref Range   Sodium 141 135 - 145 mmol/L   Potassium 4.0 3.5 - 5.1 mmol/L   Chloride 111 98 - 111 mmol/L   CO2 23 22 - 32  mmol/L   Glucose, Bld 122 (H) 70 - 99 mg/dL   BUN 33 (H) 8 - 23 mg/dL   Creatinine, Ser 0.71 0.44 - 1.00 mg/dL   Calcium 8.5 (L) 8.9 - 10.3 mg/dL   GFR calc non Af Amer >60 >60 mL/min   GFR calc Af Amer >60 >60 mL/min   Anion gap 7 5 - 15  Magnesium     Status: None   Collection Time: 02/09/18  3:10 AM  Result Value Ref Range  Magnesium 1.7 1.7 - 2.4 mg/dL  Phosphorus     Status: None   Collection Time: 02/09/18  3:10 AM  Result Value Ref Range   Phosphorus 2.6 2.5 - 4.6 mg/dL     Assessment/Plan:   NEURO  Altered Mental Status:  coma   Plan: Wean sedation and see if we can wean from the ventilator.  PULM  No obvious dysfunction.  No CXR.   Plan: CPM.  We will attempt to wean from the ventilator and see how well the patient does.  It does not seem as though the family wants to prolong care if the outlook is futile.  CARDIO  Tachybrady syndrome with pacer in place.   Plan: CPM  RENAL  Oliguria (low effective intravascular volume and I suspect GI bleeding with increased BUN, dropping hemoglobin and low urine output.)   Plan: Bolus with fluids.  GI  NO specific issues   Plan: CPM with tube feedings.  ID  No known infectious sources   Plan: CPM  HEME  Anemia acute blood loss anemia)   Plan: No blood transfusions for now  ENDO No specific issues   Plan: CPM  Global Issues  Will back off on sedation and see if the patient will wean from the ventilator.  Even if she does wean there is a good chance that she will not ever be able to go home with the family and get back to her baseline.    LOS: 2 days   Additional comments:I reviewed the patient's new clinical lab test results. cbc/bmet  Critical Care Total Time*: 30 Minutes  Judeth Horn 02/09/2018  *Care during the described time interval was provided by me and/or other providers on the critical care team.  I have reviewed this patient's available data, including medical history, events of note, physical examination  and test results as part of my evaluation.

## 2018-02-10 ENCOUNTER — Telehealth: Payer: Self-pay | Admitting: Nurse Practitioner

## 2018-02-10 ENCOUNTER — Inpatient Hospital Stay (HOSPITAL_COMMUNITY): Payer: No Typology Code available for payment source

## 2018-02-10 LAB — GLUCOSE, CAPILLARY
GLUCOSE-CAPILLARY: 177 mg/dL — AB (ref 70–99)
GLUCOSE-CAPILLARY: 151 mg/dL — AB (ref 70–99)
GLUCOSE-CAPILLARY: 173 mg/dL — AB (ref 70–99)
GLUCOSE-CAPILLARY: 195 mg/dL — AB (ref 70–99)
Glucose-Capillary: 155 mg/dL — ABNORMAL HIGH (ref 70–99)
Glucose-Capillary: 162 mg/dL — ABNORMAL HIGH (ref 70–99)

## 2018-02-10 LAB — MAGNESIUM
Magnesium: 1.7 mg/dL (ref 1.7–2.4)
Magnesium: 1.7 mg/dL (ref 1.7–2.4)

## 2018-02-10 LAB — PHOSPHORUS
PHOSPHORUS: 1.6 mg/dL — AB (ref 2.5–4.6)
Phosphorus: 1.7 mg/dL — ABNORMAL LOW (ref 2.5–4.6)

## 2018-02-10 LAB — TRIGLYCERIDES: TRIGLYCERIDES: 109 mg/dL (ref <150)

## 2018-02-10 MED ORDER — PIVOT 1.5 CAL PO LIQD
1000.0000 mL | ORAL | Status: DC
  Administered 2018-02-10 – 2018-02-12 (×3): 1000 mL
  Filled 2018-02-10: qty 1000

## 2018-02-10 MED ORDER — OXYCODONE HCL 5 MG/5ML PO SOLN
7.5000 mg | ORAL | Status: DC | PRN
  Administered 2018-02-10: 7.5 mg
  Filled 2018-02-10: qty 10

## 2018-02-10 MED ORDER — PRO-STAT SUGAR FREE PO LIQD
30.0000 mL | Freq: Every day | ORAL | Status: DC
  Administered 2018-02-11 – 2018-02-13 (×3): 30 mL
  Filled 2018-02-10 (×4): qty 30

## 2018-02-10 MED ORDER — METOPROLOL TARTRATE 25 MG/10 ML ORAL SUSPENSION
12.5000 mg | Freq: Two times a day (BID) | ORAL | Status: DC
  Administered 2018-02-10 – 2018-02-11 (×4): 12.5 mg
  Filled 2018-02-10 (×4): qty 10

## 2018-02-10 MED ORDER — HYDRALAZINE HCL 20 MG/ML IJ SOLN
10.0000 mg | Freq: Four times a day (QID) | INTRAMUSCULAR | Status: DC | PRN
  Administered 2018-02-11 – 2018-02-12 (×3): 10 mg via INTRAVENOUS
  Filled 2018-02-10 (×3): qty 1

## 2018-02-10 MED ORDER — K PHOS MONO-SOD PHOS DI & MONO 155-852-130 MG PO TABS
500.0000 mg | ORAL_TABLET | Freq: Two times a day (BID) | ORAL | Status: AC
  Administered 2018-02-10 (×2): 500 mg via ORAL
  Filled 2018-02-10 (×2): qty 2

## 2018-02-10 MED ORDER — MAGNESIUM OXIDE 400 (241.3 MG) MG PO TABS
400.0000 mg | ORAL_TABLET | Freq: Two times a day (BID) | ORAL | Status: AC
  Administered 2018-02-10 (×2): 400 mg via ORAL
  Filled 2018-02-10 (×2): qty 1

## 2018-02-10 MED ORDER — FENTANYL CITRATE (PF) 100 MCG/2ML IJ SOLN: 25.0000 ug | INTRAMUSCULAR | Status: DC | PRN

## 2018-02-10 NOTE — Progress Notes (Signed)
Patients foley catheter was removed at 1635 per Dr Biagio Borg order. Patient is due to void by 2235 (six hours post catheter removal). Also notified Dr Grandville Silos about patient's urine output of 300cc for shift. No new orders. Will continue to monitor patient.

## 2018-02-10 NOTE — Telephone Encounter (Signed)
I am aware. I saw hospital report. I hope she recovers quickly.

## 2018-02-10 NOTE — Progress Notes (Signed)
Cortrak Tube Team Note:  Spoke with RN who reports that she is unable to flush Cortrak tube. Noted tube position in xray.  Pulled tube back from 75 cm to 65 cm and re-clipped bridle. Tube can now be flushed.  RN to re-xray for confirmation.   [place], [place], [place] Pager [phone] After Hours Pager

## 2018-02-10 NOTE — Progress Notes (Signed)
Patient continues to tolerate extubation well.  Essentially nonverbal but will moan and groan some.  No hemodynamic instability or other major problem.  Patient awakens to voice.  Her pupils are small.  Her gaze is conjugate.  She is moving her left upper and both lower extremities spontaneously.  She has minimal movement of her right upper extremity.  Overall progressing reasonably well.  Patient with large right-sided temporal hematoma and small left convexity subdural hematoma and underlying contusions.  Continue supportive management.  No new recommendations.
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
## 2018-02-10 NOTE — Progress Notes (Signed)
Patient ID: Amiree No, female   DOB: 08-20-1930, 85 y.o.   MRN: [id number] I placed a CorTrak and confirmed with auscultation. WIll check abdominal film prior to use.  Georganna Skeans, MD, MPH, FACS Trauma: [phone] General Surgery: [phone]

## 2018-02-10 NOTE — Telephone Encounter (Signed)
Daughter is aware 

## 2018-02-10 NOTE — Progress Notes (Addendum)
  Subjective: Stayed extubated, non-verbal   Objective: Vital signs in last 24 hours: Temp:  [98.1 F (36.7 C)-100.6 F (38.1 C)] 99.5 F (37.5 C) (07/23 0700) Pulse Rate:  [41-109] 94 (07/23 0700) Resp:  [11-33] 21 (07/23 0700) BP: (116-180)/(81-125) 141/99 (07/23 0700) SpO2:  [89 %-100 %] 99 % (07/23 0700) FiO2 (%):  [40 %] 40 % (07/22 1220) Weight:  [61.2 kg (134 lb 14.7 oz)] 61.2 kg (134 lb 14.7 oz) (07/23 0443) Last BM Date: 02/01/2018  Intake/Output from previous day: 07/22 0701 - 07/23 0700 In: 3060 [I.V.:1678.1; IV Piggyback:1381.9] Out: 820 [Urine:820] Intake/Output this shift: No intake/output data recorded.  General appearance: no distress Resp: clear to auscultation bilaterally Cardio: irregularly irregular rhythm GI: soft, non-tender; bowel sounds normal; no masses,  no organomegaly Extremities: no edema Neuro: moves LUE spont. Not F/C, PERL 8mm, not moving RUE  Lab Results: CBC  Recent Labs    02/08/18 0331 02/09/18 0310  WBC 20.2* 13.2*  HGB 12.5 10.2*  HCT 38.7 32.2*  PLT 272 182   BMET Recent Labs    02/08/18 0331 02/09/18 0310  NA 141 141  K 3.7 4.0  CL 108 111  CO2 22 23  GLUCOSE 174* 122*  BUN 17 33*  CREATININE 0.84 0.71  CALCIUM 9.0 8.5*   PT/INR Recent Labs    02/03/2018 1718 02/08/18 0331  LABPROT 19.6* 17.5*  INR 1.67 1.45   ABG Recent Labs    01/27/2018 2145  PHART 7.376  HCO3 22.6   Assessment/Plan: PHBC Severe TBI/skull FX/B SAH and SDH/new IVH on F/U CT head - per Dr. Annette Stable. D/W him at bedside. No F/U CT H needed for now. TBI team therapies. Acute hypoxic respiratory failure - has done well since extubation CV - start home lopressor FEN - start TF, Na OK, decrease IVF to 50. Replete hypophosphatemia and hypomagnesemia VTE - PAS until Lovenox OK with NS DIspo - ICU, I spoke at length with husband, son, and daughter. Critical care 21min including nerurocritical care  LOS: 3 days    Georganna Skeans, MD, MPH,  FACS Trauma: [phone] General Surgery: [phone]  7/23/2019Patient ID: Jill Short, female   DOB: May 31, 1931, 84 y.o.   MRN: [id number]

## 2018-02-10 NOTE — Progress Notes (Signed)
While attempting to give patient her medications through her cortrak I was meeting resistance. I was unable to pull back any residual or flush. Heather, RD was notified and came and assessed and repositioned the cortrak to 65cm. Cortrak was confirmed in place with auscultation. A repeat ABD x-ray was ordered to verify placement. Will continue to monitor.
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
## 2018-02-10 NOTE — Telephone Encounter (Signed)
FYI

## 2018-02-10 NOTE — Progress Notes (Signed)
Cortrak Tube Team Note:   MD placed a 10 F Cortrak tube in the R nare. I secured tube with a nasal bridle at 73 cm.   Please confirm tube placement before using the Cortrak tube.   If the tube becomes dislodged please keep the tube and contact the Cortrak team at [email or web address] (password [email or web address]) for replacement.  If after hours and replacement cannot be delayed, place a NG tube and confirm placement with an abdominal x-ray.    [place], [place], [place] Pager [phone] After Hours Pager

## 2018-02-10 NOTE — Progress Notes (Signed)
Wasted 75cc fentanyl with Corrie Dandy.
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
## 2018-02-11 ENCOUNTER — Other Ambulatory Visit: Payer: Self-pay

## 2018-02-11 ENCOUNTER — Encounter (HOSPITAL_COMMUNITY): Payer: Self-pay

## 2018-02-11 LAB — GLUCOSE, CAPILLARY
GLUCOSE-CAPILLARY: 194 mg/dL — AB (ref 70–99)
GLUCOSE-CAPILLARY: 163 mg/dL — AB (ref 70–99)
Glucose-Capillary: 150 mg/dL — ABNORMAL HIGH (ref 70–99)
Glucose-Capillary: 150 mg/dL — ABNORMAL HIGH (ref 70–99)
Glucose-Capillary: 159 mg/dL — ABNORMAL HIGH (ref 70–99)
Glucose-Capillary: 194 mg/dL — ABNORMAL HIGH (ref 70–99)

## 2018-02-11 LAB — MAGNESIUM
MAGNESIUM: 1.9 mg/dL (ref 1.7–2.4)
Magnesium: 2 mg/dL (ref 1.7–2.4)

## 2018-02-11 LAB — BASIC METABOLIC PANEL
ANION GAP: 10 (ref 5–15)
BUN: 44 mg/dL — AB (ref 8–23)
CHLORIDE: 115 mmol/L — AB (ref 98–111)
CO2: 22 mmol/L (ref 22–32)
Calcium: 8.9 mg/dL (ref 8.9–10.3)
Creatinine, Ser: 0.67 mg/dL (ref 0.44–1.00)
GFR calc Af Amer: >60 mL/min (ref >60)
GFR calc non Af Amer: >60 mL/min (ref >60)
Glucose, Bld: 203 mg/dL — ABNORMAL HIGH (ref 70–99)
POTASSIUM: 3.7 mmol/L (ref 3.5–5.1)
SODIUM: 147 mmol/L — AB (ref 135–145)

## 2018-02-11 LAB — PHOSPHORUS
PHOSPHORUS: 1.6 mg/dL — AB (ref 2.5–4.6)
PHOSPHORUS: 1.5 mg/dL — AB (ref 2.5–4.6)

## 2018-02-11 MED ORDER — FREE WATER
100.0000 mL | Freq: Three times a day (TID) | Status: DC
  Administered 2018-02-11 – 2018-02-12 (×4): 100 mL

## 2018-02-11 MED ORDER — SODIUM CHLORIDE 0.45 % IV SOLN
INTRAVENOUS | Status: DC
Start: 2018-02-11 — End: 2018-02-15
  Administered 2018-02-11 – 2018-02-15 (×5): via INTRAVENOUS

## 2018-02-11 MED ORDER — K PHOS MONO-SOD PHOS DI & MONO 155-852-130 MG PO TABS
500.0000 mg | ORAL_TABLET | Freq: Three times a day (TID) | ORAL | Status: AC
  Administered 2018-02-11 (×3): 500 mg via ORAL
  Filled 2018-02-11 (×3): qty 2

## 2018-02-11 MED ORDER — FENTANYL CITRATE (PF) 100 MCG/2ML IJ SOLN
25.0000 ug | INTRAMUSCULAR | Status: DC | PRN
  Administered 2018-02-12 – 2018-02-14 (×7): 25 ug via INTRAVENOUS
  Filled 2018-02-11 (×7): qty 2

## 2018-02-11 MED ORDER — TRAMADOL 5 MG/ML ORAL SUSPENSION: 25.0000 mg | Freq: Four times a day (QID) | ORAL | Status: DC | PRN

## 2018-02-11 MED ORDER — CHLORHEXIDINE GLUCONATE 0.12 % MT SOLN
OROMUCOSAL | Status: AC
  Filled 2018-02-11: qty 15

## 2018-02-11 MED ORDER — POLYETHYLENE GLYCOL 3350 17 G PO PACK
17.0000 g | PACK | Freq: Every day | ORAL | Status: DC
  Administered 2018-02-11 – 2018-02-13 (×3): 17 g
  Filled 2018-02-11 (×3): qty 1

## 2018-02-11 MED ORDER — TRAMADOL HCL 50 MG PO TABS: 25.0000 mg | ORAL_TABLET | Freq: Four times a day (QID) | ORAL | Status: DC | PRN

## 2018-02-11 MED ORDER — PANTOPRAZOLE SODIUM 40 MG PO PACK
40.0000 mg | PACK | Freq: Every day | ORAL | Status: DC
  Administered 2018-02-11 – 2018-02-13 (×3): 40 mg
  Filled 2018-02-11 (×3): qty 20

## 2018-02-11 MED ORDER — INSULIN GLARGINE 100 UNIT/ML ~~LOC~~ SOLN
10.0000 [IU] | Freq: Every day | SUBCUTANEOUS | Status: DC
  Administered 2018-02-11 – 2018-02-12 (×2): 10 [IU] via SUBCUTANEOUS
  Filled 2018-02-11 (×3): qty 0.1

## 2018-02-11 MED ORDER — POTASSIUM PHOSPHATES 15 MMOLE/5ML IV SOLN: 30.0000 mmol | Freq: Two times a day (BID) | INTRAVENOUS | Status: DC

## 2018-02-11 NOTE — Progress Notes (Signed)
MD updated about patient not having urine output. Order received to in and out cath patient. RN will continue to monitor.

## 2018-02-11 NOTE — Plan of Care (Signed)
Pt currently receiving tube feeds via Cortrak.  Speech therapy will continue to see her to determine when diet can progress.   Will continue to monitor.  Katherine Mantle RN

## 2018-02-11 NOTE — Progress Notes (Signed)
Patient continuously moves off pillow when repositioned to relieve pressure off bottom with Stage two on sacrum. RN will continue to monitor.

## 2018-02-11 NOTE — Progress Notes (Signed)
Trauma Service Note  Subjective: Patient is mostly agitated and somnolent alternatively.  Will not follow commands.  Family at the bedside.  She grimaces occasionally.  Objective: Vital signs in last 24 hours: Temp:  [97.5 F (36.4 C)-98.6 F (37 C)] 97.6 F (36.4 C) (07/24 0400) Pulse Rate:  [69-111] 81 (07/24 0600) Resp:  [15-30] 24 (07/24 0600) BP: (120-176)/(71-122) 144/92 (07/24 0600) SpO2:  [94 %-100 %] 99 % (07/24 0600) Weight:  [62.8 kg (138 lb 7.2 oz)] 62.8 kg (138 lb 7.2 oz) (07/24 0418) Last BM Date: 01/22/2018  Intake/Output from previous day: 07/23 0701 - 07/24 0700 In: 1737.3 [I.V.:1152.5; NG/GT:484.7; IV Piggyback:100.1] Out: 700 [Urine:700] Intake/Output this shift: No intake/output data recorded.  General: Does not appear to be in any acute distress  Lungs: Clear to auscultation.  Abd: Soft, tolerating tube feedings at goal of 40 well.  Extremities: No changes  Neuro: Agitated, not following commands.  Moving left arm and both lower extremities well.  Not moving her RUE much at all.  Lab Results: CBC  Recent Labs    02/09/18 0310  WBC 13.2*  HGB 10.2*  HCT 32.2*  PLT 182   BMET Recent Labs    02/09/18 0310 02/11/18 0455  NA 141 147*  K 4.0 3.7  CL 111 115*  CO2 23 22  GLUCOSE 122* 203*  BUN 33* 44*  CREATININE 0.71 0.67  CALCIUM 8.5* 8.9   PT/INR No results for input(s): LABPROT, INR in the last 72 hours. ABG No results for input(s): PHART, HCO3 in the last 72 hours.  Invalid input(s): PCO2, PO2  Studies/Results: Dg Abd 1 View  Result Date: 02/10/2018 CLINICAL DATA:  Nasogastric tube placement. EXAM: ABDOMEN - 1 VIEW COMPARISON:  Radiograph of same day. FINDINGS: The bowel gas pattern is normal. Distal tip of feeding tube is seen in proximal stomach, although the tube may be kinked. Surgical clips are seen in right side of abdomen. IMPRESSION: Distal tip of feeding tube seen in proximal stomach. Electronically Signed   By: Marijo Conception, M.D.   On: 02/10/2018 17:46   Dg Abd 1 View  Result Date: 02/10/2018 CLINICAL DATA:  Feeding tube placement EXAM: ABDOMEN - 1 VIEW COMPARISON:  CT abdomen and pelvis February 07, 2018 FINDINGS: Feeding tube tip is in the stomach. There is moderate stool in the colon. There is no bowel dilatation or air-fluid level to suggest bowel obstruction. No free air. There is aortic atherosclerosis. There is left lung base consolidation with left pleural effusion. There is scoliosis and arthropathy in the lumbar spine. Postoperative changes noted in the right abdomen. IMPRESSION: Feeding tube tip in stomach. No bowel obstruction or free air evident. Left lung base consolidation with left pleural effusion. There is aortic atherosclerosis. Aortic Atherosclerosis (ICD10-I70.0). Electronically Signed   By: Lowella Grip III M.D.   On: 02/10/2018 13:07    Anti-infectives: Anti-infectives (From admission, onward)   None      Assessment/Plan: s/p  Hypophosphatemia, hypernatremia  Continue nutrition. Replace Phosphate Free water and change IVFs Keep in ICU for today.  LOS: 4 days   Kathryne Eriksson. Dahlia Bailiff, MD, FACS [phone] Trauma Surgeon 02/11/2018
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
## 2018-02-11 NOTE — Progress Notes (Signed)
Overall stable.  Continues to tolerate extubation.  No new issues or problems overnight.  Very somnolent.  Will open eyes to deep pain.  Purposeful on the left.  Minimal movement on right.  Status post superior traumatic brain injury.  Continue efforts at supportive care.  No new recommendations.

## 2018-02-12 ENCOUNTER — Inpatient Hospital Stay (HOSPITAL_COMMUNITY): Payer: No Typology Code available for payment source

## 2018-02-12 LAB — GLUCOSE, CAPILLARY
GLUCOSE-CAPILLARY: 153 mg/dL — AB (ref 70–99)
GLUCOSE-CAPILLARY: 148 mg/dL — AB (ref 70–99)
Glucose-Capillary: 128 mg/dL — ABNORMAL HIGH (ref 70–99)
Glucose-Capillary: 149 mg/dL — ABNORMAL HIGH (ref 70–99)
Glucose-Capillary: 153 mg/dL — ABNORMAL HIGH (ref 70–99)

## 2018-02-12 LAB — CBC WITH DIFFERENTIAL/PLATELET
Basophils Absolute: 0 10*3/uL (ref 0.0–0.1)
Basophils Relative: 0 %
EOS PCT: 0 %
Eosinophils Absolute: 0 10*3/uL (ref 0.0–0.7)
HEMATOCRIT: 34.1 % — AB (ref 36.0–46.0)
Hemoglobin: 11.2 g/dL — ABNORMAL LOW (ref 12.0–15.0)
Lymphocytes Relative: 6 %
Lymphs Abs: 0.8 10*3/uL (ref 0.7–4.0)
MCH: 30.5 pg (ref 26.0–34.0)
MCHC: 32.8 g/dL (ref 30.0–36.0)
MCV: 92.9 fL (ref 78.0–100.0)
MONO ABS: 0.8 10*3/uL (ref 0.1–1.0)
Monocytes Relative: 6 %
Neutro Abs: 12.4 10*3/uL — ABNORMAL HIGH (ref 1.7–7.7)
Neutrophils Relative %: 88 %
Platelets: 196 10*3/uL (ref 150–400)
RBC: 3.67 MIL/uL — ABNORMAL LOW (ref 3.87–5.11)
RDW: 17 % — AB (ref 11.5–15.5)
WBC: 14 10*3/uL — AB (ref 4.0–10.5)

## 2018-02-12 LAB — BASIC METABOLIC PANEL
Anion gap: 9 (ref 5–15)
BUN: 45 mg/dL — AB (ref 8–23)
CALCIUM: 8.7 mg/dL — AB (ref 8.9–10.3)
CHLORIDE: 112 mmol/L — AB (ref 98–111)
CO2: 25 mmol/L (ref 22–32)
CREATININE: 0.59 mg/dL (ref 0.44–1.00)
GFR calc non Af Amer: >60 mL/min (ref >60)
Glucose, Bld: 148 mg/dL — ABNORMAL HIGH (ref 70–99)
Potassium: 3.4 mmol/L — ABNORMAL LOW (ref 3.5–5.1)
SODIUM: 146 mmol/L — AB (ref 135–145)

## 2018-02-12 LAB — PHOSPHORUS: PHOSPHORUS: 2.2 mg/dL — AB (ref 2.5–4.6)

## 2018-02-12 LAB — PATHOLOGIST SMEAR REVIEW

## 2018-02-12 MED ORDER — METOPROLOL TARTRATE 25 MG/10 ML ORAL SUSPENSION
25.0000 mg | Freq: Two times a day (BID) | ORAL | Status: DC
  Administered 2018-02-12 – 2018-02-13 (×3): 25 mg
  Filled 2018-02-12 (×3): qty 10

## 2018-02-12 MED ORDER — POTASSIUM CHLORIDE 20 MEQ/15ML (10%) PO SOLN
20.0000 meq | Freq: Two times a day (BID) | ORAL | Status: DC
  Administered 2018-02-12 – 2018-02-13 (×3): 20 meq
  Filled 2018-02-12 (×3): qty 15

## 2018-02-12 MED ORDER — FREE WATER
200.0000 mL | Freq: Three times a day (TID) | Status: DC
  Administered 2018-02-12 – 2018-02-13 (×4): 200 mL

## 2018-02-12 NOTE — Progress Notes (Signed)
Overall stable.  Minimal eye-opening to deep pain.  No efforts of verbalization.  Purposeful on the left.  No movement of her right upper extremity.  Withdraws legs.  Status post severe bilateral traumatic brain injury.  Continue supportive efforts.  Follow-up PET CT scan in morning.
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
## 2018-02-13 ENCOUNTER — Telehealth: Payer: Self-pay | Admitting: Internal Medicine

## 2018-02-13 ENCOUNTER — Inpatient Hospital Stay (HOSPITAL_COMMUNITY): Payer: No Typology Code available for payment source

## 2018-02-13 DIAGNOSIS — Z66 Do not resuscitate: Secondary | ICD-10-CM

## 2018-02-13 DIAGNOSIS — S0990XA Unspecified injury of head, initial encounter: Secondary | ICD-10-CM

## 2018-02-13 LAB — CBC
HCT: 34.7 % — ABNORMAL LOW (ref 36.0–46.0)
Hemoglobin: 11.2 g/dL — ABNORMAL LOW (ref 12.0–15.0)
MCH: 30.4 pg (ref 26.0–34.0)
MCHC: 32.3 g/dL (ref 30.0–36.0)
MCV: 94.3 fL (ref 78.0–100.0)
Platelets: 201 10*3/uL (ref 150–400)
RBC: 3.68 MIL/uL — ABNORMAL LOW (ref 3.87–5.11)
RDW: 17.5 % — ABNORMAL HIGH (ref 11.5–15.5)
WBC: 12.1 10*3/uL — ABNORMAL HIGH (ref 4.0–10.5)

## 2018-02-13 LAB — BASIC METABOLIC PANEL
Anion gap: 9 (ref 5–15)
BUN: 48 mg/dL — ABNORMAL HIGH (ref 8–23)
CO2: 25 mmol/L (ref 22–32)
Calcium: 8.9 mg/dL (ref 8.9–10.3)
Chloride: 111 mmol/L (ref 98–111)
Creatinine, Ser: 0.68 mg/dL (ref 0.44–1.00)
GFR calc Af Amer: >60 mL/min (ref >60)
GFR calc non Af Amer: >60 mL/min (ref >60)
Glucose, Bld: 131 mg/dL — ABNORMAL HIGH (ref 70–99)
Potassium: 4 mmol/L (ref 3.5–5.1)
Sodium: 145 mmol/L (ref 135–145)

## 2018-02-13 LAB — GLUCOSE, CAPILLARY
GLUCOSE-CAPILLARY: 136 mg/dL — AB (ref 70–99)
Glucose-Capillary: 118 mg/dL — ABNORMAL HIGH (ref 70–99)
Glucose-Capillary: 138 mg/dL — ABNORMAL HIGH (ref 70–99)

## 2018-02-13 MED ORDER — METOPROLOL TARTRATE 5 MG/5ML IV SOLN: 5.0000 mg | Freq: Three times a day (TID) | INTRAVENOUS | Status: DC

## 2018-02-13 MED ORDER — GLYCOPYRROLATE 0.2 MG/ML IJ SOLN
0.2000 mg | INTRAMUSCULAR | Status: DC | PRN
  Administered 2018-02-14: 0.2 mg via INTRAVENOUS

## 2018-02-13 MED ORDER — POLYVINYL ALCOHOL 1.4 % OP SOLN: 1.0000 [drp] | Freq: Four times a day (QID) | OPHTHALMIC | Status: DC | PRN

## 2018-02-13 MED ORDER — METOPROLOL TARTRATE 25 MG/10 ML ORAL SUSPENSION
25.0000 mg | Freq: Two times a day (BID) | ORAL | Status: AC
  Filled 2018-02-13 (×2): qty 10

## 2018-02-13 MED ORDER — ACETAMINOPHEN 650 MG RE SUPP: 650.0000 mg | Freq: Four times a day (QID) | RECTAL | Status: DC | PRN

## 2018-02-13 MED ORDER — HYPROMELLOSE (GONIOSCOPIC) 2.5 % OP SOLN
1.0000 [drp] | Freq: Four times a day (QID) | OPHTHALMIC | Status: DC | PRN
Start: 2018-02-13 — End: 2018-02-15
  Filled 2018-02-13: qty 15

## 2018-02-13 NOTE — Progress Notes (Signed)
OT Cancellation Note  Patient Details Name: Jill Short MRN: [id number] DOB: 1931/03/27   Cancelled Treatment:    Reason Eval/Treat Not Completed: Medical issues which prohibited therapy.   Possible move to comfort care. Await palliative care consult. Will continue to follow pending [place].    [place], OTR/L [phone]  [name] M 02/13/2018, 11:33 AM

## 2018-02-13 NOTE — Progress Notes (Signed)
Patient ID: Jill Short, female   DOB: 03-21-1931, 84 y.o.   MRN: [id number] Follow up - Trauma Critical Care  Patient Details:    Jill Short is an 84 y.o. female.  Lines/tubes : External Urinary Catheter (Active)  Collection Container Dedicated Suction Canister 02/12/2018  8:00 AM  Securement Method Other (Comment) 02/12/2018  8:00 AM  Output (mL) 0 mL 02/10/2018 11:00 PM    Microbiology/Sepsis markers: Results for orders placed or performed during the hospital encounter of 01/31/2018  MRSA PCR Screening     Status: None   Collection Time: 02/05/2018  8:32 PM  Result Value Ref Range Status   MRSA by PCR NEGATIVE NEGATIVE Final    Comment:        The GeneXpert MRSA Assay (FDA approved for NASAL specimens only), is one component of a comprehensive MRSA colonization surveillance program. It is not intended to diagnose MRSA infection nor to guide or monitor treatment for MRSA infections. Performed at [facility], [place] [address] [place], [place] [address]     Anti-infectives:  Anti-infectives (From admission, onward)   None      Best Practice/Protocols:  VTE Prophylaxis: Mechanical   Consults: Treatment Team:  Earnie Larsson, MD   Subjective:    Overnight Issues:   Objective:  Vital signs for last 24 hours: Temp:  [97.2 F (36.2 C)-98.3 F (36.8 C)] 97.6 F (36.4 C) (07/26 0420) Pulse Rate:  [64-119] 83 (07/26 0700) Resp:  [19-34] 26 (07/26 0700) BP: (125-162)/(76-106) 143/96 (07/26 0700) SpO2:  [93 %-99 %] 96 % (07/26 0700) Weight:  [62.1 kg (136 lb 14.5 oz)] 62.1 kg (136 lb 14.5 oz) (07/26 0500)  Hemodynamic parameters for last 24 hours:    Intake/Output from previous day: 07/25 0701 - 07/26 0700 In: 2906.6 [I.V.:1186.6; NG/GT:1720] Out: 1600 [Urine:1600]  Intake/Output this shift: No intake/output data recorded.  Vent settings for last 24 hours:    Physical Exam:  General: no distress Neuro: PERL, moans to pain, moves BLE, LUE spont  some HEENT/Neck: no JVD Resp: clear to auscultation bilaterally CVS: IRR GI: soft, nontender, BS WNL, no r/g Extremities: edema 1+  Results for orders placed or performed during the hospital encounter of 01/27/2018 (from the past 24 hour(s))  Glucose, capillary     Status: Abnormal   Collection Time: 02/12/18 10:58 AM  Result Value Ref Range   Glucose-Capillary 149 (H) 70 - 99 mg/dL  Glucose, capillary     Status: Abnormal   Collection Time: 02/12/18  7:30 PM  Result Value Ref Range   Glucose-Capillary 148 (H) 70 - 99 mg/dL   Comment 1 Notify RN    Comment 2 Document in Chart   Glucose, capillary     Status: Abnormal   Collection Time: 02/12/18 11:25 PM  Result Value Ref Range   Glucose-Capillary 153 (H) 70 - 99 mg/dL   Comment 1 Notify RN    Comment 2 Document in Chart   CBC     Status: Abnormal   Collection Time: 02/13/18  3:35 AM  Result Value Ref Range   WBC 12.1 (H) 4.0 - 10.5 K/uL   RBC 3.68 (L) 3.87 - 5.11 MIL/uL   Hemoglobin 11.2 (L) 12.0 - 15.0 g/dL   HCT 34.7 (L) 36.0 - 46.0 %   MCV 94.3 78.0 - 100.0 fL   MCH 30.4 26.0 - 34.0 pg   MCHC 32.3 30.0 - 36.0 g/dL   RDW 17.5 (H) 11.5 - 15.5 %   Platelets  201 150 - 400 K/uL  Basic metabolic panel     Status: Abnormal   Collection Time: 02/13/18  3:35 AM  Result Value Ref Range   Sodium 145 135 - 145 mmol/L   Potassium 4.0 3.5 - 5.1 mmol/L   Chloride 111 98 - 111 mmol/L   CO2 25 22 - 32 mmol/L   Glucose, Bld 131 (H) 70 - 99 mg/dL   BUN 48 (H) 8 - 23 mg/dL   Creatinine, Ser 0.68 0.44 - 1.00 mg/dL   Calcium 8.9 8.9 - 10.3 mg/dL   GFR calc non Af Amer >60 >60 mL/min   GFR calc Af Amer >60 >60 mL/min   Anion gap 9 5 - 15  Glucose, capillary     Status: Abnormal   Collection Time: 02/13/18  4:16 AM  Result Value Ref Range   Glucose-Capillary 118 (H) 70 - 99 mg/dL   Comment 1 Notify RN    Comment 2 Document in Chart   Glucose, capillary     Status: Abnormal   Collection Time: 02/13/18  7:53 AM  Result Value Ref  Range   Glucose-Capillary 138 (H) 70 - 99 mg/dL   Comment 1 Notify RN     Assessment & Plan: Present on Admission: . Subdural hematoma ([place])    LOS: 6 days   Additional comments:I reviewed the patient's new clinical lab test results. and new CT head PHBC Severe TBI/skull FX/B SAH and SDH/new IVH on F/U CT head - per Dr. Annette Stable. TBI team therapies. Acute hypoxic respiratory failure - has done well since extubation, on RA R shoulder - x-ray neg HTN - increased lopressor 7/26 FEN - likely stop TF/free water if transition to comfort care VTE - PAS Dispo - ICU, I spoke at length with husband, son and daughter. They are strongly considering comfort care ans they report Karaline would never want to live in a SNF with total care. I have asked Palliative to consult. I anticipate TF to 6N later today after their evaluation. Critical Care Total Time*: 76 Minutes  Georganna Skeans, MD, MPH, [facility] Trauma: [phone] General Surgery: [phone]  02/13/2018  *Care during the described time interval was provided by me. I have reviewed this patient's available data, including medical history, events of note, physical examination and test results as part of my evaluation.
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
## 2018-02-13 NOTE — Progress Notes (Signed)
Overall unchanged.  No significant improvement or decline.  Patient remains very somnolent with minimal awakening to stimulation.  Right upper extremity weakness unchanged.  Semipurposeful with left upper extremity.  Follow-up head CT scan today demonstrates stable appearance of her right temporal hematoma.  Left subdural minimal.  Family has decided to move towards palliative care.  At this point I do not see any neurosurgical issues that are ongoing.  Please call me if I can be of any assistance.
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
## 2018-02-13 NOTE — Progress Notes (Signed)
PT Cancellation Note  Patient Details Name: Jill Short MRN: [id number] DOB: 1931/04/19   Cancelled Treatment:    Reason Eval/Treat Not Completed: Other (comment).  Possible move to comfort care.  Await palliative care consult.  PT following.  Thanks,    Barbarann Ehlers. [place], [place], DPT [phone]   02/13/2018, 10:21 AM

## 2018-02-13 NOTE — Progress Notes (Signed)
Patietn arrived from [facility] with assist of two nurses. Comfortable and cleaned and will continue to monitor

## 2018-02-13 NOTE — Progress Notes (Signed)
SLP Cancellation Note  Patient Details Name: Jill Short MRN: [id number] DOB: 1931/07/21   Cancelled treatment:       Reason Eval/Treat Not Completed: Other (comment) Possible move to comfort care.  Await palliative care consult. Will continue to follow pending [place].     [name] 02/13/2018, 10:26 AM  [name], M.A. CCC-SLP [phone]

## 2018-02-14 DIAGNOSIS — G8911 Acute pain due to trauma: Secondary | ICD-10-CM

## 2018-02-14 DIAGNOSIS — Z7189 Other specified counseling: Secondary | ICD-10-CM

## 2018-02-14 DIAGNOSIS — M25511 Pain in right shoulder: Secondary | ICD-10-CM

## 2018-02-14 DIAGNOSIS — I609 Nontraumatic subarachnoid hemorrhage, unspecified: Secondary | ICD-10-CM

## 2018-02-14 DIAGNOSIS — S065X9A Traumatic subdural hemorrhage with loss of consciousness of unspecified duration, initial encounter: Principal | ICD-10-CM

## 2018-02-14 DIAGNOSIS — Z515 Encounter for palliative care: Secondary | ICD-10-CM

## 2018-02-14 MED ORDER — MORPHINE BOLUS VIA INFUSION
2.0000 mg | INTRAVENOUS | Status: DC | PRN
  Filled 2018-02-14: qty 2

## 2018-02-14 MED ORDER — LORAZEPAM 2 MG/ML IJ SOLN: 1.0000 mg | INTRAMUSCULAR | Status: DC | PRN

## 2018-02-14 MED ORDER — GLYCOPYRROLATE 0.2 MG/ML IJ SOLN
0.2000 mg | Freq: Three times a day (TID) | INTRAMUSCULAR | Status: DC
  Administered 2018-02-14 – 2018-02-15 (×3): 0.2 mg via INTRAVENOUS
  Filled 2018-02-14 (×5): qty 1

## 2018-02-14 MED ORDER — FENTANYL CITRATE (PF) 100 MCG/2ML IJ SOLN
25.0000 ug | INTRAMUSCULAR | Status: DC
  Administered 2018-02-14 (×2): 25 ug via INTRAVENOUS
  Filled 2018-02-14: qty 2

## 2018-02-14 MED ORDER — FENTANYL CITRATE (PF) 100 MCG/2ML IJ SOLN
25.0000 ug | INTRAMUSCULAR | Status: DC | PRN
  Filled 2018-02-14: qty 2

## 2018-02-14 MED ORDER — LORAZEPAM 2 MG/ML IJ SOLN
0.5000 mg | Freq: Three times a day (TID) | INTRAMUSCULAR | Status: DC
  Administered 2018-02-14 – 2018-02-15 (×3): 0.5 mg via INTRAVENOUS
  Filled 2018-02-14 (×3): qty 1

## 2018-02-14 MED ORDER — CHLORHEXIDINE GLUCONATE 0.12 % MT SOLN
OROMUCOSAL | Status: AC
  Filled 2018-02-14: qty 15

## 2018-02-14 MED ORDER — MORPHINE 100MG IN NS 100ML (1MG/ML) PREMIX INFUSION
1.0000 mg/h | INTRAVENOUS | Status: DC
  Administered 2018-02-14: 1 mg/h via INTRAVENOUS
  Filled 2018-02-14: qty 100
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
## 2018-02-14 NOTE — Progress Notes (Signed)
    CC:  TBI  Subjective: Patient remains a bit restless.  Her husband daughter and 1 son are  with her.  She has another son coming in later today.  The expectation is to turn off the defibrillator at that point.  Her family seems to be handling the situation well.  Objective: Vital signs in last 24 hours: Temp:  [98 F (36.7 C)-99.8 F (37.7 C)] 98.5 F (36.9 C) (07/26 2338) Pulse Rate:  [68-94] 74 (07/26 2338) Resp:  [16-28] 16 (07/26 2338) BP: (130-169)/(71-114) 154/106 (07/26 2338) SpO2:  [92 %-98 %] 97 % (07/26 2338) Weight:  [65.2 kg (143 lb 12.8 oz)] 65.2 kg (143 lb 12.8 oz) (07/26 2338) Last BM Date: 02/14/18 N.p.o. 1100 IV 1150 urine BM x1 Vital signs of been discontinued No labs/no radiology studies CT head 02/13/2018: 1. Unchanged volume of intraparenchymal, subdural and subarachnoid hemorrhage. No new hemorrhagic site or new mass effect. 2. No hydrocephalus. 3. Unchanged appearance of areas of hypoattenuation within the right occipital lobe, left caudate head and multiple supratentorial subcortical locations. Intake/Output from previous day: 07/26 0701 - 07/27 0700 In: 1073.4 [I.V.:1073.4] Out: 850 [Urine:850] Intake/Output this shift: Total I/O In: 0  Out: 100 [Urine:100]  General appearance: Still somnolent, and not really responsive.  Lab Results:  Recent Labs    02/12/18 0400 02/13/18 0335  WBC 14.0* 12.1*  HGB 11.2* 11.2*  HCT 34.1* 34.7*  PLT 196 201    BMET Recent Labs    02/12/18 0400 02/13/18 0335  NA 146* 145  K 3.4* 4.0  CL 112* 111  CO2 25 25  GLUCOSE 148* 131*  BUN 45* 48*  CREATININE 0.59 0.68  CALCIUM 8.7* 8.9   PT/INR No results for input(s): LABPROT, INR in the last 72 hours.  Recent Labs  Lab 01/19/2018 1718  AST 36  ALT 15  ALKPHOS 44  BILITOT 0.8  PROT 7.6  ALBUMIN 3.4*     Lipase  No results found for: LIPASE   Medications: . chlorhexidine gluconate (MEDLINE KIT)  15 mL Mouth Rinse BID  . fentaNYL  (SUBLIMAZE) injection  25 mcg Intravenous Q3H  . metoprolol tartrate  25 mg Per Tube BID  . metoprolol tartrate  5 mg Intravenous Q8H    Assessment/Plan PHBC Severe TBI/skull FX/B SAH and SDH/new IVH on F/U CT head - per Dr. Erick Blinks improvement or decline.  Patient remains somnolent with minimal awakening to stimulation. "CT 7/26: Stable appearance of right temporal hematoma/left subdural minimal.   - TBI team therapies. Held for Palliative consult Acute hypoxic respiratory failure - has done well since extubation, on RA R shoulder - x-ray neg Palliative care -  DNR/DNI/comfort measures/IV fluids, cardiac medications/turn off defibrillator after her son sees her today. HTN - increased lopressor 7/26 FEN - likely stop TF/free water if transition to comfort care VTE - PAS   Plan: Palliative care, turn the defibrillator off later today after he other son has had an opportunity to see and visit with her.       LOS: 7 days    Somaly Marteney 02/14/2018 [phone]

## 2018-02-15 MED ORDER — HYPROMELLOSE (GONIOSCOPIC) 2.5 % OP SOLN
1.0000 [drp] | Freq: Three times a day (TID) | OPHTHALMIC | Status: DC
  Administered 2018-02-15 (×2): 1 [drp] via OPHTHALMIC
  Filled 2018-02-15: qty 15

## 2018-02-15 MED ORDER — HYPROMELLOSE (GONIOSCOPIC) 2.5 % OP SOLN: 1.0000 [drp] | Freq: Three times a day (TID) | OPHTHALMIC | Status: DC

## 2018-02-15 MED ORDER — LORAZEPAM 2 MG/ML IJ SOLN
1.0000 mg | Freq: Three times a day (TID) | INTRAMUSCULAR | Status: DC
  Administered 2018-02-15: 1 mg via INTRAVENOUS
  Filled 2018-02-15: qty 1

## 2018-02-16 ENCOUNTER — Encounter: Payer: Self-pay | Admitting: Podiatry

## 2018-02-19 NOTE — Progress Notes (Signed)
Witnessed waste of 64.8 ml morphine drip.

## 2018-02-19 NOTE — Progress Notes (Signed)
Pt passed away at 7 with family at bedside, pastor had just prayed over pt.  Confirmed death and spent time with family.

## 2018-02-19 NOTE — Progress Notes (Signed)
[name] (daughter) is contact person. Her # is [phone].  Funeral home is [facility] in [place].  They will call me with the information of the funeral home locally who will manage her here.  Their # is :  [phone] and I gave them the charge nurse phone # to let us know for follow up.
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
## 2018-02-19 NOTE — Progress Notes (Signed)
1515:  Patient without respirations or heart beat.  Called by Samantha Crimes, RN and verified by Hulan Amato, RN.  Patients spouse, daughter, and 2 sons and priest at bedside.    1545:  [facility] notified.  1620:  [facility] notified.

## 2018-02-19 NOTE — Progress Notes (Signed)
Wasted the remaining 64.8 ml of morphine drip in sink.  Witnessed by Hulan Amato, RN.

## 2018-02-19 NOTE — Progress Notes (Signed)
Social visit  I came by and met with the family. She has been made dnr/dni/comfort care with anticipated in hospital death. No complaints or concerns at this time.  Sharon Mt. Dema Severin, M.D. [facility]

## 2018-02-19 DEATH — deceased

## 2018-02-24 ENCOUNTER — Ambulatory Visit: Payer: Medicare Other | Admitting: Nurse Practitioner
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
## 2018-04-14 ENCOUNTER — Encounter: Payer: Medicare Other | Admitting: Internal Medicine

## 2018-05-15 ENCOUNTER — Ambulatory Visit: Payer: Medicare Other | Admitting: Podiatry
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
## 2020-06-13 IMAGING — CT CT HEAD W/O CM
4 series · 16 of 47 positions shown, 18 images · non-contrast
Comparison: 02/09/2018

CLINICAL DATA: Subdural hematoma follow-up

EXAM:
CT HEAD WITHOUT CONTRAST
TECHNIQUE: Contiguous axial images were obtained from the base of the skull
through the vertex without intravenous contrast.

[Series 3: head wo · axial · 0.39mm/px · z∈[+1331,+1446]mm · 7 of 31 slices shown, 9 images]
[im 4/31  brain]
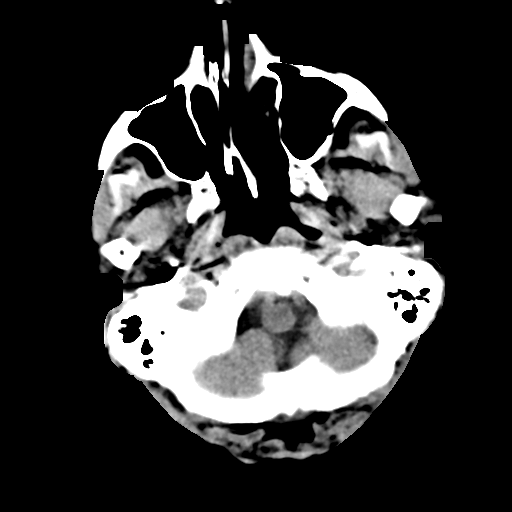
[im 4/31  bone]
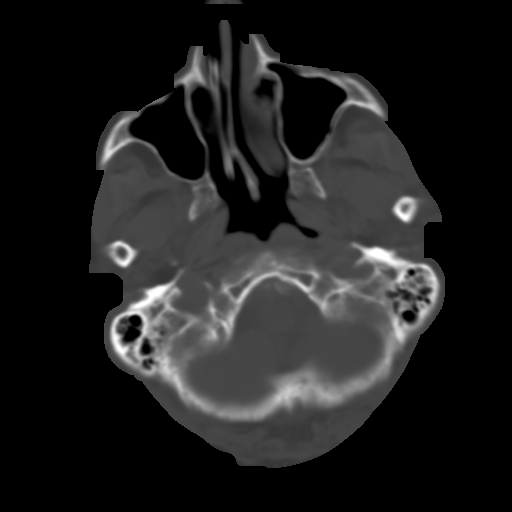
[im 8/31  brain]
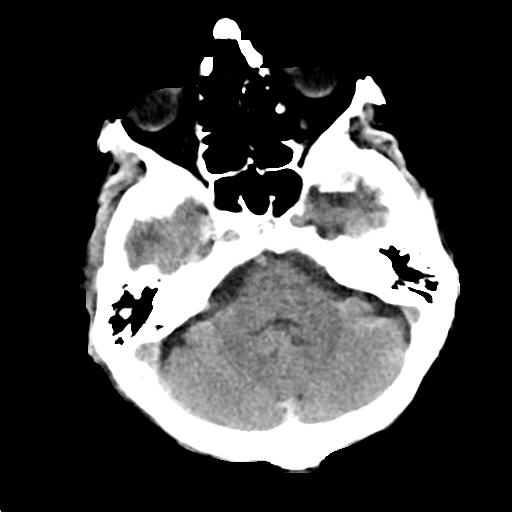
[im 12/31  brain]
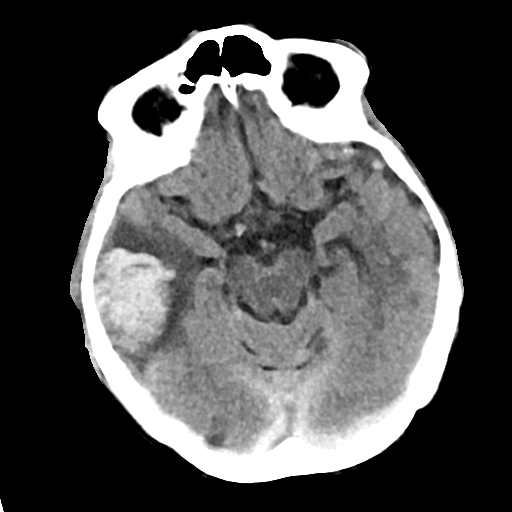
[im 16/31  brain]
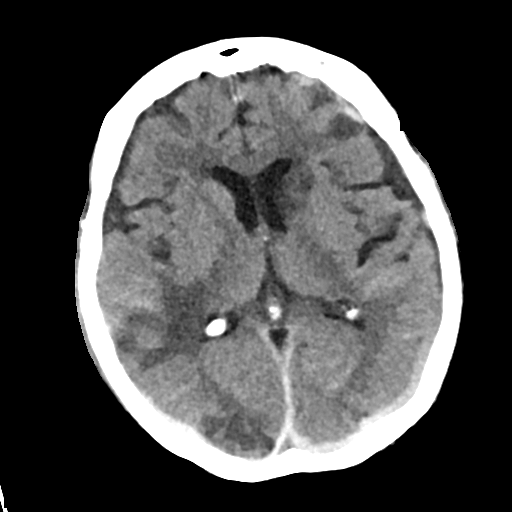
[im 19/31  brain]
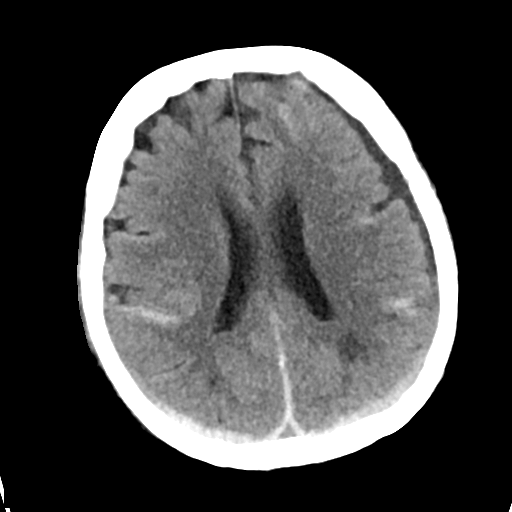
[im 19/31  bone]
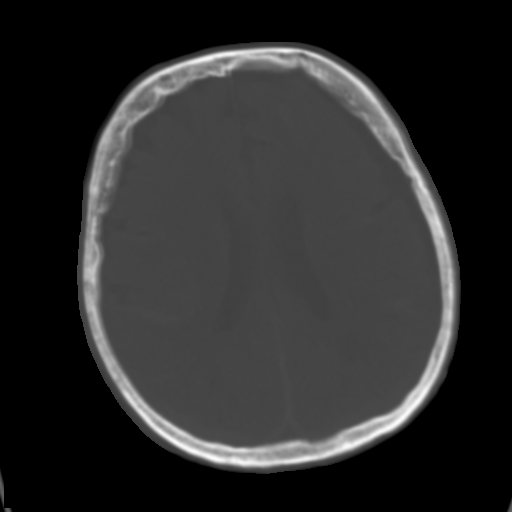
[im 23/31  brain]
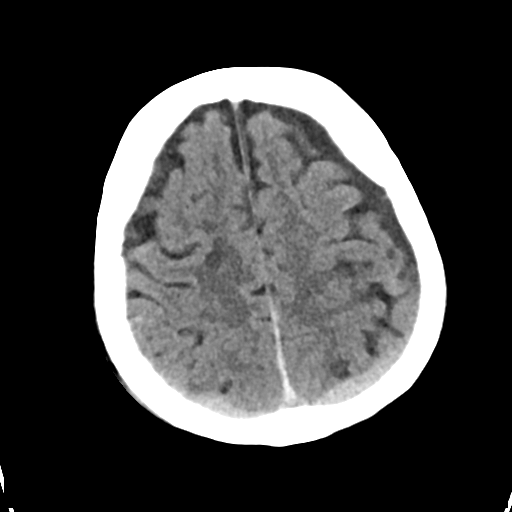
[im 27/31  brain]
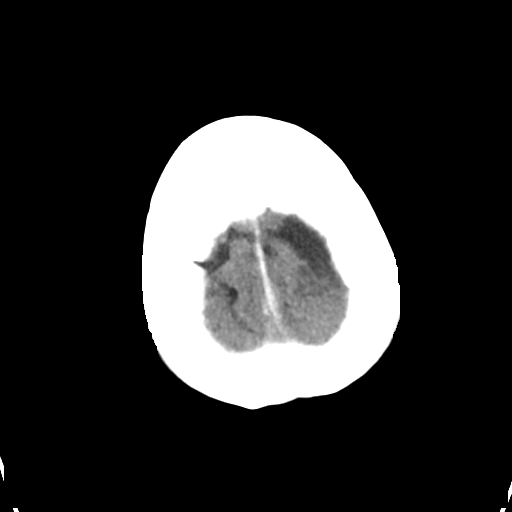

[Series 4: head bone · axial · 0.39mm/px · z∈[+1330,+1360]mm · 3 of 76 slices shown]
[im 8/76  bone]
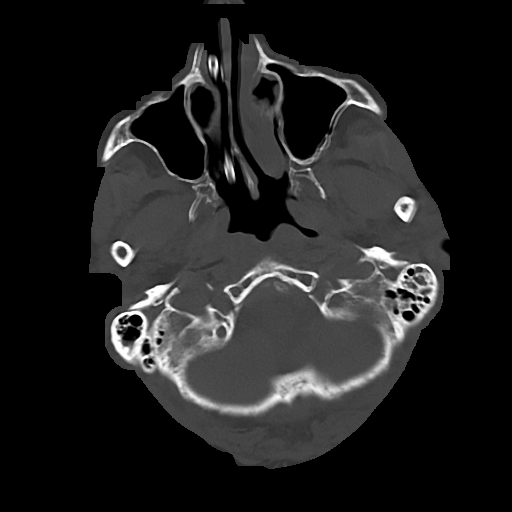
[im 16/76  bone]
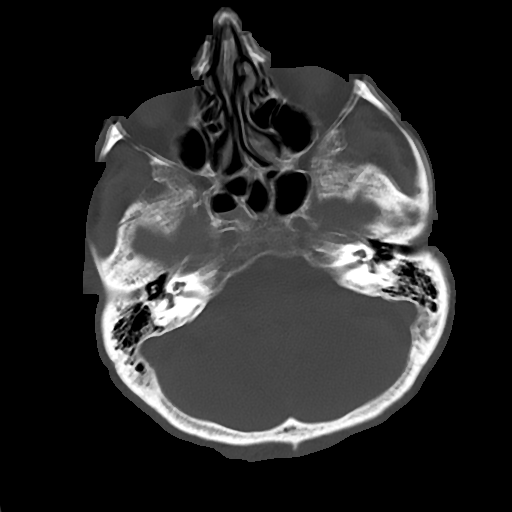
[im 23/76  bone]
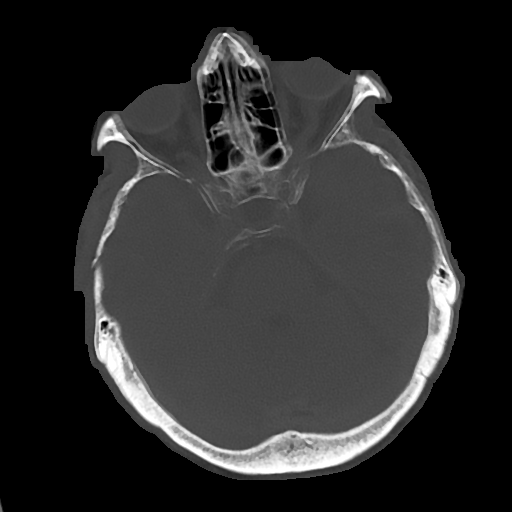

[Series 5: cor soft · coronal · 0.29mm/px · 3 of 67 slices shown]
[im 23/67  brain]
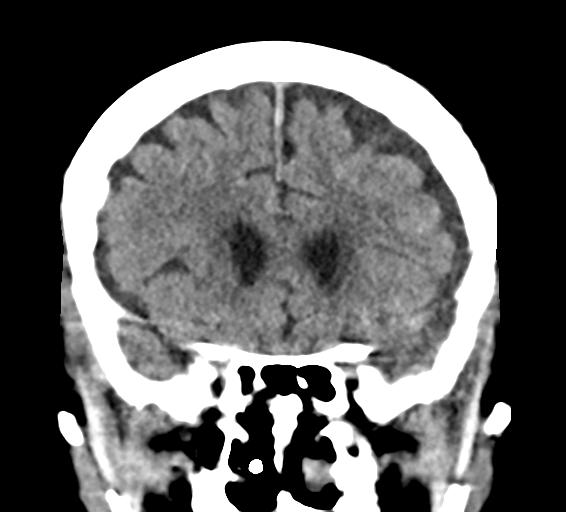
[im 30/67  brain]
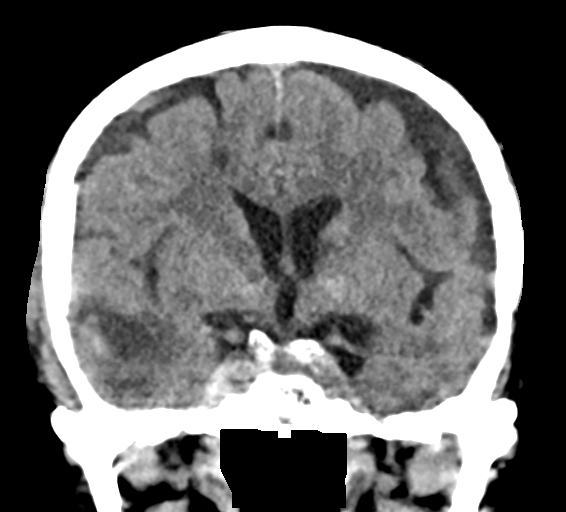
[im 37/67  brain]
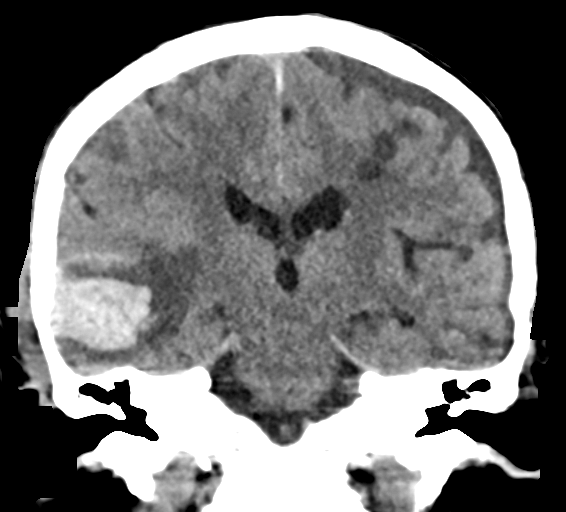

[Series 6: sag soft · sagittal · 0.29mm/px · 3 of 53 slices shown]
[im 18/53  brain]
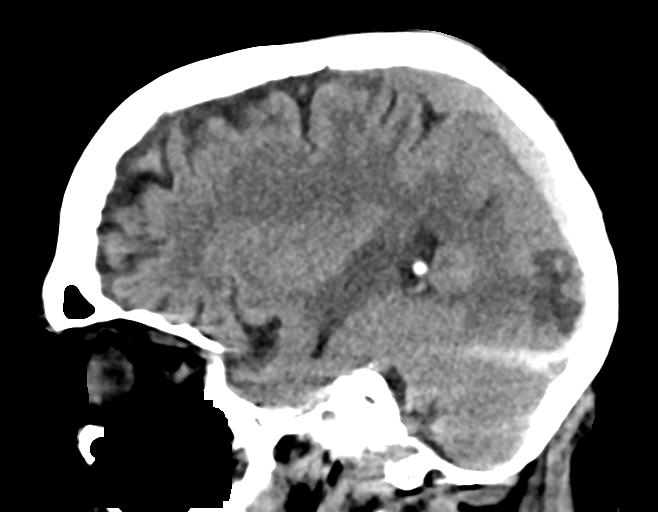
[im 27/53  brain]
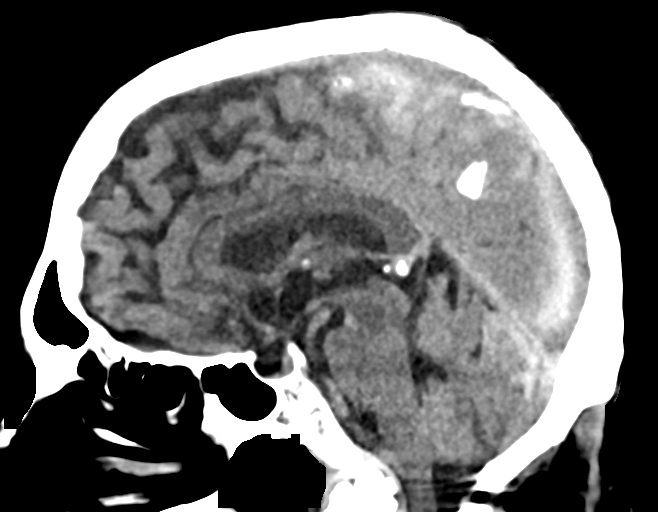
[im 35/53  brain]
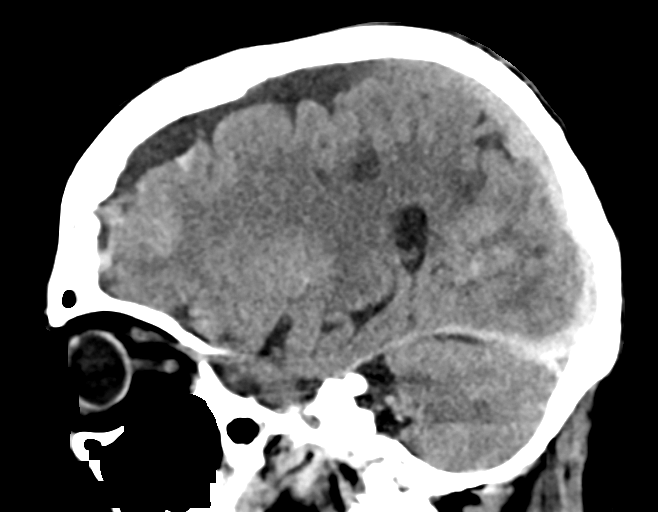

[16 of 47 positions shown; findings below may reference images not displayed]

FINDINGS: Brain: The intraparenchymal hematoma within the right temporal lobe
is unchanged in size measuring 4.0 x 3.1 cm. There is subarachnoid
blood over both convexities in unchanged distribution. Bilateral
subdural hematomata are unchanged in thickness. There is no midline
shift. No new mass effect. Hypoattenuation of the left caudate head
is unchanged. Multifocal subcortical hypoattenuation within the
frontal and parietal lobes is unchanged. Cortical hypoattenuation of
the right occipital lobe is also unchanged. Trace intraventricular
blood in the right occipital horn. Unchanged size and configuration
of the ventricles.

Vascular: No hyperdense vessel or unexpected calcification.

Skull: Assessment of the skull is degraded by motion, but
nondisplaced right sphenoid fracture is unchanged.

Sinuses/Orbits: No acute finding.

Other: None.
IMPRESSION: 1. Unchanged volume of intraparenchymal, subdural and subarachnoid
hemorrhage. No new hemorrhagic site or new mass effect.
2. No hydrocephalus.
3. Unchanged appearance of areas of hypoattenuation within the right
occipital lobe, left caudate head and multiple supratentorial
subcortical locations.
